# Patient Record
Sex: Male | Born: 1945 | Race: White | Hispanic: No | Marital: Married | Smoking: Former smoker
Health system: Southern US, Community
[De-identification: ages and names within clinical notes are randomized; demographics above are authoritative.]

## PROBLEM LIST (undated history)

## (undated) DIAGNOSIS — S72002A Fracture of unspecified part of neck of left femur, initial encounter for closed fracture: Secondary | ICD-10-CM

## (undated) DIAGNOSIS — S61218A Laceration without foreign body of other finger without damage to nail, initial encounter: Secondary | ICD-10-CM

## (undated) DIAGNOSIS — J349 Unspecified disorder of nose and nasal sinuses: Secondary | ICD-10-CM

## (undated) DIAGNOSIS — F329 Major depressive disorder, single episode, unspecified: Secondary | ICD-10-CM

## (undated) DIAGNOSIS — S62102A Fracture of unspecified carpal bone, left wrist, initial encounter for closed fracture: Secondary | ICD-10-CM

## (undated) DIAGNOSIS — E785 Hyperlipidemia, unspecified: Secondary | ICD-10-CM

## (undated) DIAGNOSIS — J45909 Unspecified asthma, uncomplicated: Secondary | ICD-10-CM

## (undated) DIAGNOSIS — F32A Depression, unspecified: Secondary | ICD-10-CM

## (undated) DIAGNOSIS — C801 Malignant (primary) neoplasm, unspecified: Secondary | ICD-10-CM

## (undated) DIAGNOSIS — R0602 Shortness of breath: Secondary | ICD-10-CM

## (undated) DIAGNOSIS — M1990 Unspecified osteoarthritis, unspecified site: Secondary | ICD-10-CM

## (undated) DIAGNOSIS — E669 Obesity, unspecified: Secondary | ICD-10-CM

## (undated) DIAGNOSIS — J449 Chronic obstructive pulmonary disease, unspecified: Secondary | ICD-10-CM

## (undated) DIAGNOSIS — Z8489 Family history of other specified conditions: Secondary | ICD-10-CM

## (undated) DIAGNOSIS — D721 Eosinophilia, unspecified: Secondary | ICD-10-CM

## (undated) DIAGNOSIS — K219 Gastro-esophageal reflux disease without esophagitis: Secondary | ICD-10-CM

## (undated) DIAGNOSIS — D649 Anemia, unspecified: Secondary | ICD-10-CM

## (undated) DIAGNOSIS — D696 Thrombocytopenia, unspecified: Secondary | ICD-10-CM

## (undated) HISTORY — DX: Unspecified asthma, uncomplicated: J45.909

## (undated) HISTORY — DX: Obesity, unspecified: E66.9

## (undated) HISTORY — DX: Thrombocytopenia, unspecified: D69.6

## (undated) HISTORY — DX: Major depressive disorder, single episode, unspecified: F32.9

## (undated) HISTORY — DX: Depression, unspecified: F32.A

## (undated) HISTORY — DX: Unspecified osteoarthritis, unspecified site: M19.90

## (undated) HISTORY — DX: Eosinophilia: D72.1

## (undated) HISTORY — PX: OTHER SURGICAL HISTORY: SHX169

## (undated) HISTORY — DX: Fracture of unspecified carpal bone, left wrist, initial encounter for closed fracture: S62.102A

## (undated) HISTORY — PX: MULTIPLE TOOTH EXTRACTIONS: SHX2053

## (undated) HISTORY — DX: Chronic obstructive pulmonary disease, unspecified: J44.9

## (undated) HISTORY — DX: Eosinophilia, unspecified: D72.10

## (undated) HISTORY — DX: Laceration without foreign body of other finger without damage to nail, initial encounter: S61.218A

---

## 2004-01-08 ENCOUNTER — Ambulatory Visit: Payer: Self-pay | Admitting: Family Medicine

## 2004-05-05 ENCOUNTER — Emergency Department (HOSPITAL_COMMUNITY): Admission: EM | Admit: 2004-05-05 | Discharge: 2004-05-05 | Payer: Self-pay | Admitting: Emergency Medicine

## 2004-05-07 ENCOUNTER — Emergency Department (HOSPITAL_COMMUNITY): Admission: EM | Admit: 2004-05-07 | Discharge: 2004-05-07 | Payer: Self-pay | Admitting: Emergency Medicine

## 2004-10-20 ENCOUNTER — Ambulatory Visit: Payer: Self-pay | Admitting: Family Medicine

## 2005-01-10 ENCOUNTER — Ambulatory Visit: Payer: Self-pay | Admitting: Family Medicine

## 2005-07-28 ENCOUNTER — Ambulatory Visit: Payer: Self-pay | Admitting: Family Medicine

## 2005-08-31 ENCOUNTER — Ambulatory Visit: Payer: Self-pay | Admitting: Family Medicine

## 2005-10-19 ENCOUNTER — Ambulatory Visit: Payer: Self-pay | Admitting: Family Medicine

## 2005-11-17 ENCOUNTER — Ambulatory Visit: Payer: Self-pay | Admitting: Family Medicine

## 2005-11-21 ENCOUNTER — Ambulatory Visit: Payer: Self-pay | Admitting: Family Medicine

## 2006-03-14 ENCOUNTER — Ambulatory Visit: Payer: Self-pay | Admitting: Family Medicine

## 2008-05-17 ENCOUNTER — Emergency Department (HOSPITAL_COMMUNITY): Admission: EM | Admit: 2008-05-17 | Discharge: 2008-05-17 | Payer: Self-pay | Admitting: Emergency Medicine

## 2009-02-15 ENCOUNTER — Emergency Department (HOSPITAL_COMMUNITY): Admission: EM | Admit: 2009-02-15 | Discharge: 2009-02-15 | Payer: Self-pay | Admitting: Emergency Medicine

## 2009-02-15 ENCOUNTER — Ambulatory Visit: Payer: Self-pay | Admitting: Internal Medicine

## 2009-02-15 DIAGNOSIS — M109 Gout, unspecified: Secondary | ICD-10-CM | POA: Insufficient documentation

## 2009-02-15 DIAGNOSIS — J301 Allergic rhinitis due to pollen: Secondary | ICD-10-CM | POA: Insufficient documentation

## 2009-02-15 DIAGNOSIS — D721 Eosinophilia: Secondary | ICD-10-CM

## 2009-02-15 DIAGNOSIS — M129 Arthropathy, unspecified: Secondary | ICD-10-CM | POA: Insufficient documentation

## 2009-02-15 DIAGNOSIS — K625 Hemorrhage of anus and rectum: Secondary | ICD-10-CM

## 2009-02-16 HISTORY — PX: COLONOSCOPY W/ BIOPSIES AND POLYPECTOMY: SHX1376

## 2009-02-18 ENCOUNTER — Encounter: Payer: Self-pay | Admitting: Gastroenterology

## 2009-02-24 ENCOUNTER — Ambulatory Visit (HOSPITAL_COMMUNITY): Admission: RE | Admit: 2009-02-24 | Discharge: 2009-02-24 | Payer: Self-pay | Admitting: Gastroenterology

## 2009-02-24 ENCOUNTER — Ambulatory Visit: Payer: Self-pay | Admitting: Gastroenterology

## 2009-03-15 ENCOUNTER — Ambulatory Visit (HOSPITAL_COMMUNITY): Payer: Self-pay | Admitting: Oncology

## 2009-03-15 ENCOUNTER — Encounter (HOSPITAL_COMMUNITY): Admission: RE | Admit: 2009-03-15 | Discharge: 2009-04-14 | Payer: Self-pay | Admitting: Oncology

## 2009-04-15 ENCOUNTER — Encounter: Payer: Self-pay | Admitting: Gastroenterology

## 2009-04-19 ENCOUNTER — Encounter (HOSPITAL_COMMUNITY): Admission: RE | Admit: 2009-04-19 | Discharge: 2009-05-19 | Payer: Self-pay | Admitting: Oncology

## 2009-05-17 ENCOUNTER — Ambulatory Visit (HOSPITAL_COMMUNITY): Payer: Self-pay | Admitting: Oncology

## 2009-05-21 ENCOUNTER — Encounter (HOSPITAL_COMMUNITY): Admission: RE | Admit: 2009-05-21 | Discharge: 2009-06-20 | Payer: Self-pay | Admitting: Oncology

## 2009-06-21 ENCOUNTER — Encounter (HOSPITAL_COMMUNITY): Admission: RE | Admit: 2009-06-21 | Discharge: 2009-07-21 | Payer: Self-pay | Admitting: Oncology

## 2009-09-02 ENCOUNTER — Encounter: Payer: Self-pay | Admitting: Gastroenterology

## 2009-09-28 ENCOUNTER — Ambulatory Visit (HOSPITAL_COMMUNITY): Payer: Self-pay | Admitting: Oncology

## 2009-09-28 ENCOUNTER — Encounter (HOSPITAL_COMMUNITY)
Admission: RE | Admit: 2009-09-28 | Discharge: 2009-10-28 | Payer: Self-pay | Source: Home / Self Care | Admitting: Oncology

## 2009-12-27 ENCOUNTER — Encounter (HOSPITAL_COMMUNITY)
Admission: RE | Admit: 2009-12-27 | Discharge: 2010-01-26 | Payer: Self-pay | Source: Home / Self Care | Attending: Oncology | Admitting: Oncology

## 2009-12-27 ENCOUNTER — Ambulatory Visit (HOSPITAL_COMMUNITY): Payer: Self-pay | Admitting: Oncology

## 2010-01-27 ENCOUNTER — Encounter: Payer: Self-pay | Admitting: Internal Medicine

## 2010-02-15 NOTE — Letter (Signed)
Summary: [facility]   Imported By: Diana Eves 04/15/2009 11:36:39  _____________________________________________________________________  External Attachment:    Type:   Image     Comment:   External Document

## 2010-02-15 NOTE — Letter (Signed)
Summary: DR Mariel Sleet REFERRAL  DR Mariel Sleet REFERRAL   Imported By: Ave Filter 02/24/2009 11:11:11  _____________________________________________________________________  External Attachment:    Type:   Image     Comment:   External Document

## 2010-02-15 NOTE — Letter (Signed)
Summary: [facility] ORDER  [facility]   Imported By: Ricard Dillon 02/18/2009 12:46:49  _____________________________________________________________________  External Attachment:    Type:   Image     Comment:   External Document

## 2010-02-15 NOTE — Letter (Signed)
Summary: [facility]   Imported By: Rexene Alberts 09/02/2009 09:10:09  _____________________________________________________________________  External Attachment:    Type:   Image     Comment:   External Document

## 2010-02-15 NOTE — Assessment & Plan Note (Signed)
Summary: NPP.GI BLEED/GLU   Visit Type:  Initial Consult Referring Provider:  Dr Adriana Sim[facility]) Primary Care Provider:  Nyland  Chief Complaint:  GI bleeding.  History of Present Illness: 65 y/o caucasian male w/ bloody diarrhea that began 3 days ago.  c/o loose watery stool 3 days ago w/ bright red bleeding Fri night.  Then became darkerSat am and evening.  BMs x 2 today without blood.  Denies any abd pain or proctalgia.  Hx hemorrhoids.  The patient denies any upper GI symptoms which include nausea, vomiting, heartburn, indigestion, dysphagia, odynophagia, anorexia or early satiety.  c/o bloating & passing flatus.  Denies CP or SOB, no dizziness.   Taking 2 Aleve q 3 days for athritis in hands, also on mobic once daily for gout in feet.     Hgb 14.2, Hct 41.5,   Current Problems (verified): 1)  Allergic Rhinitis, Seasonal  (ICD-477.0) 2)  Arthritis  (ICD-716.90) 3)  Gout, Unspecified  (ICD-274.9) 4)  Rectal Bleeding  (ICD-569.3)  Current Medications (verified): 1)  Lipitor 20 Mg Tabs (Atorvastatin Calcium) .... 1/2 Tablet Daily 2)  Mobic 7.5 Mg Tabs (Meloxicam) .... Take 1 Tablet By Mouth Once A Day 3)  Fish Oil 1000 Mg .... Take 1 Tablet By Mouth Once A Day 4)  Symbicort 80-4.5 Mcg/act Aero (Budesonide-Formoterol Fumarate) .... Two Times A Day As Needed For Sinuses  Allergies (verified): No Known Drug Allergies  Past History:  Past Medical History: ALLERGIC RHINITIS, SEASONAL (ICD-477.0) ARTHRITIS (ICD-716.90) GOUT, UNSPECIFIED (ICD-274.9)  Past Surgical History: Unremarkable  Family History: No known family history of colorectal carcinoma, IBD, liver or chronic GI problems. Father: (deceased 33) MI, COPD Mother: (deceased 22) leukemia Siblings: 5 brothers, 3 sisters total 2 deceased CAD, CVA, leukemia  Social History: married 1964 4 grown healthy chew tobacco x 58yrs Patient is a former smoker. 25pkyr, quit 1985  Alcohol Use - yes, beer couple times per  month Illicit Drug Use - no Daily Caffeine Use Patient does not get regular exercise.  Smoking Status:  quit Drug Use:  no Does Patient Exercise:  no  Review of Systems General:  Denies fever, chills, sweats, anorexia, fatigue, weakness, malaise, weight loss, and sleep disorder. CV:  Denies chest pains, angina, palpitations, syncope, dyspnea on exertion, orthopnea, PND, peripheral edema, and claudication. Resp:  Denies dyspnea at rest, dyspnea with exercise, cough, sputum, wheezing, coughing up blood, and pleurisy. GI:  Denies difficulty swallowing, pain on swallowing, nausea, indigestion/heartburn, vomiting, vomiting blood, jaundice, diarrhea, change in bowel habits, and fecal incontinence. GU:  Denies urinary burning, blood in urine, urinary frequency, urinary hesitancy, and nocturnal urination. Derm:  Denies rash, itching, dry skin, hives, moles, warts, and unhealing ulcers. Psych:  Denies depression, anxiety, memory loss, suicidal ideation, hallucinations, paranoia, phobia, and confusion. Heme:  Denies bruising and enlarged lymph nodes.  Vital Signs:  Patient profile:   65 year old male Height:      67.5 inches Weight:      280 pounds BMI:     43.36 Temp:     97.8 degrees F oral Pulse rate:   60 / minute BP sitting:   150 / 78  (left arm) Cuff size:   large  Vitals Entered By: Cloria Spring LPN (February 15, 2009 3:54 PM)  Physical Exam  General:  Well developed, well nourished, no acute distress.obese.   Head:  Normocephalic and atraumatic. Eyes:  Sclera clear, no icterus. Ears:  Normal auditory acuity. Nose:  No deformity, discharge,  or lesions. Mouth:  No deformity or lesions, dentition normal. Neck:  Supple; no masses or thyromegaly. Lungs:  Clear throughout to auscultation. Heart:  Regular rate and rhythm; no murmurs, rubs,  or bruits. Abdomen:  Soft, nontender and nondistended. No masses, hepatosplenomegaly or hernias noted. Normal bowel sounds.obese, without  guarding, and without rebound.   Rectal:  deferred until time of colonoscopy.   Msk:  Symmetrical with no gross deformities. Normal posture. Pulses:  Normal pulses noted. Extremities:  No clubbing, cyanosis, edema or deformities noted. Neurologic:  Alert and  oriented x4;  grossly normal neurologically. Skin:  Intact without significant lesions or rashes. Cervical Nodes:  No significant cervical adenopathy. Psych:  Alert and cooperative. Normal mood and affect.  Impression & Recommendations:  Problem # 1:  RECTAL BLEEDING (ICD-569.3) 65 y/o caucasian male with painless moderative volume rectal bleeding w/ loose stools.  Seen in ER this AM and hgb normal.   Denies any upper GI complaints.  Differentials include internal hemorrhoids, fissure, NSAID-induced colitis, diverticular bleeding, or colorectal CA.  He also has peripheral eosinophilia and I am not sure if this is an acute reaction or chronic problem, but this will need to be follow-up to r/o occult malignancy.  Diagnostic colonoscopy to be performed by Dr. Jonette Eva in the near future.  I have discussed risks and benefits which include, but are not limited to, bleeding, infection, perforation, or medication reaction.  The patient agrees with this plan and consent will be obtained.  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  All changed / newly prescribed medications were explained.  A complete medication list was provided to the patient / caregiver.      Appended Document: NPP.GI BLEED/GLU Pt should see PCP for eosinophilia.  FAX CBC to Dr. Lysbeth Galas.  Appended Document: NPP.GI BLEED/GLU LM for return call.  Appended Document: NPP.GI BLEED/GLU Pt has appt w/ Dr Lysbeth Galas in 2 weeks.  Will FU EOS.  Appended Document: NPP.GI BLEED/GLU Labs  02/22/2009 from Dr Lysbeth Galas EOS 18%, WBC 7.2, Plt 148, CBC & CMP otherwise normal.  Appended Document: NPP.GI BLEED/GLU Disc above w/ Dr Darrick Penna. Will refer to hematology/oncology.

## 2010-02-17 NOTE — Letter (Signed)
Summary: [facility]  [facility]   Imported By: Rexene Alberts 01/27/2010 09:32:10  _____________________________________________________________________  External Attachment:    Type:   Image     Comment:   External Document

## 2010-03-25 ENCOUNTER — Encounter (HOSPITAL_COMMUNITY): Payer: BC Managed Care – PPO | Attending: Oncology

## 2010-03-25 ENCOUNTER — Other Ambulatory Visit (HOSPITAL_COMMUNITY): Payer: BC Managed Care – PPO

## 2010-03-25 DIAGNOSIS — D696 Thrombocytopenia, unspecified: Secondary | ICD-10-CM

## 2010-03-25 DIAGNOSIS — D721 Eosinophilia, unspecified: Secondary | ICD-10-CM | POA: Insufficient documentation

## 2010-03-29 LAB — CBC
HCT: 40.1 % (ref 39.0–52.0)
Hemoglobin: 13.9 g/dL (ref 13.0–17.0)
MCH: 31.4 pg (ref 26.0–34.0)
MCHC: 34.7 g/dL (ref 30.0–36.0)
MCV: 90.5 fL (ref 78.0–100.0)
Platelets: 126 10*3/uL — ABNORMAL LOW (ref 150–400)
RBC: 4.43 MIL/uL (ref 4.22–5.81)
RDW: 12.5 % (ref 11.5–15.5)
WBC: 6.9 10*3/uL (ref 4.0–10.5)

## 2010-03-29 LAB — DIFFERENTIAL
Basophils Absolute: 0.1 10*3/uL (ref 0.0–0.1)
Basophils Relative: 1 % (ref 0–1)
Eosinophils Absolute: 1.3 10*3/uL — ABNORMAL HIGH (ref 0.0–0.7)
Eosinophils Relative: 19 % — ABNORMAL HIGH (ref 0–5)
Lymphocytes Relative: 22 % (ref 12–46)
Lymphs Abs: 1.5 10*3/uL (ref 0.7–4.0)
Monocytes Absolute: 0.4 10*3/uL (ref 0.1–1.0)
Monocytes Relative: 6 % (ref 3–12)
Neutro Abs: 3.7 10*3/uL (ref 1.7–7.7)
Neutrophils Relative %: 53 % (ref 43–77)

## 2010-03-29 LAB — COMPREHENSIVE METABOLIC PANEL
AST: 17 U/L (ref 0–37)
Alkaline Phosphatase: 77 U/L (ref 39–117)
CO2: 26 mEq/L (ref 19–32)
Chloride: 103 mEq/L (ref 96–112)
GFR calc Af Amer: >60 mL/min (ref >60)
Glucose, Bld: 109 mg/dL — ABNORMAL HIGH (ref 70–99)
Potassium: 4.1 mEq/L (ref 3.5–5.1)
Total Bilirubin: 1.2 mg/dL (ref 0.3–1.2)

## 2010-03-31 LAB — DIFFERENTIAL
Eosinophils Relative: 14 % — ABNORMAL HIGH (ref 0–5)
Lymphocytes Relative: 20 % (ref 12–46)
Lymphs Abs: 1.4 10*3/uL (ref 0.7–4.0)
Neutro Abs: 4.2 10*3/uL (ref 1.7–7.7)

## 2010-03-31 LAB — CBC
MCH: 31.6 pg (ref 26.0–34.0)
MCHC: 34.2 g/dL (ref 30.0–36.0)
MCV: 92.4 fL (ref 78.0–100.0)
Platelets: 116 10*3/uL — ABNORMAL LOW (ref 150–400)
RBC: 4.26 MIL/uL (ref 4.22–5.81)
RDW: 12.9 % (ref 11.5–15.5)
WBC: 6.9 10*3/uL (ref 4.0–10.5)

## 2010-04-03 LAB — COMPREHENSIVE METABOLIC PANEL
ALT: 22 U/L (ref 0–53)
AST: 21 U/L (ref 0–37)
Albumin: 4 g/dL (ref 3.5–5.2)
Alkaline Phosphatase: 76 U/L (ref 39–117)
BUN: 17 mg/dL (ref 6–23)
CO2: 28 mEq/L (ref 19–32)
Calcium: 9.1 mg/dL (ref 8.4–10.5)
Chloride: 104 mEq/L (ref 96–112)
Creatinine, Ser: 1.16 mg/dL (ref 0.4–1.5)
GFR calc Af Amer: >60 mL/min (ref >60)
GFR calc non Af Amer: >60 mL/min (ref >60)
Glucose, Bld: 110 mg/dL — ABNORMAL HIGH (ref 70–99)
Potassium: 4.4 mEq/L (ref 3.5–5.1)
Sodium: 139 mEq/L (ref 135–145)
Total Bilirubin: 1 mg/dL (ref 0.3–1.2)
Total Protein: 6.9 g/dL (ref 6.0–8.3)

## 2010-04-03 LAB — CBC
HCT: 41.5 % (ref 39.0–52.0)
Hemoglobin: 14.2 g/dL (ref 13.0–17.0)
MCHC: 34.2 g/dL (ref 30.0–36.0)
MCV: 91.9 fL (ref 78.0–100.0)
Platelets: 137 10*3/uL — ABNORMAL LOW (ref 150–400)
RBC: 4.51 MIL/uL (ref 4.22–5.81)
RDW: 12.5 % (ref 11.5–15.5)
WBC: 8.2 10*3/uL (ref 4.0–10.5)

## 2010-04-03 LAB — DIFFERENTIAL
Basophils Relative: 0 % (ref 0–1)
Eosinophils Absolute: 1.6 10*3/uL — ABNORMAL HIGH (ref 0.0–0.7)
Eosinophils Relative: 20 % — ABNORMAL HIGH (ref 0–5)
Lymphs Abs: 1.5 10*3/uL (ref 0.7–4.0)
Neutro Abs: 4.8 10*3/uL (ref 1.7–7.7)

## 2010-04-04 LAB — DIFFERENTIAL
Eosinophils Relative: 19 % — ABNORMAL HIGH (ref 0–5)
Lymphocytes Relative: 20 % (ref 12–46)
Lymphs Abs: 1.4 10*3/uL (ref 0.7–4.0)
Monocytes Absolute: 0.3 10*3/uL (ref 0.1–1.0)
Neutro Abs: 3.9 10*3/uL (ref 1.7–7.7)
Neutrophils Relative %: 56 % (ref 43–77)

## 2010-04-04 LAB — CBC
HCT: 41.4 % (ref 39.0–52.0)
Hemoglobin: 14.2 g/dL (ref 13.0–17.0)
Platelets: 124 10*3/uL — ABNORMAL LOW (ref 150–400)
RDW: 12.3 % (ref 11.5–15.5)

## 2010-04-05 LAB — GIARDIA/CRYPTOSPORIDIUM SCREEN(EIA)
Cryptosporidium Screen (EIA): NEGATIVE
Giardia Screen - EIA: NEGATIVE

## 2010-04-05 LAB — CBC
Hemoglobin: 14.9 g/dL (ref 13.0–17.0)
Platelets: 121 10*3/uL — ABNORMAL LOW (ref 150–400)
RDW: 12.4 % (ref 11.5–15.5)

## 2010-04-05 LAB — DIFFERENTIAL
Basophils Absolute: 0 10*3/uL (ref 0.0–0.1)
Eosinophils Relative: 19 % — ABNORMAL HIGH (ref 0–5)
Monocytes Absolute: 0.3 10*3/uL (ref 0.1–1.0)
Monocytes Relative: 4 % (ref 3–12)
Neutrophils Relative %: 59 % (ref 43–77)

## 2010-04-05 LAB — ANA: Anti Nuclear Antibody(ANA): NEGATIVE

## 2010-04-06 LAB — DIFFERENTIAL
Basophils Relative: 0 % (ref 0–1)
Eosinophils Relative: 14 % — ABNORMAL HIGH (ref 0–5)
Eosinophils Relative: 14 % — ABNORMAL HIGH (ref 0–5)
Lymphocytes Relative: 21 % (ref 12–46)
Lymphocytes Relative: 18 % (ref 12–46)
Lymphs Abs: 1.7 10*3/uL (ref 0.7–4.0)
Monocytes Absolute: 0.3 10*3/uL (ref 0.1–1.0)
Monocytes Relative: 4 % (ref 3–12)
Neutro Abs: 4.9 10*3/uL (ref 1.7–7.7)
Neutro Abs: 4.4 10*3/uL (ref 1.7–7.7)
Neutrophils Relative %: 63 % (ref 43–77)

## 2010-04-06 LAB — CBC
HCT: 40.9 % (ref 39.0–52.0)
MCV: 91.1 fL (ref 78.0–100.0)
MCV: 93.3 fL (ref 78.0–100.0)
Platelets: 146 10*3/uL — ABNORMAL LOW (ref 150–400)
Platelets: 133 10*3/uL — ABNORMAL LOW (ref 150–400)
RBC: 4.64 MIL/uL (ref 4.22–5.81)
RDW: 12.9 % (ref 11.5–15.5)
RDW: 12.7 % (ref 11.5–15.5)

## 2010-04-06 LAB — COMPREHENSIVE METABOLIC PANEL
AST: 22 U/L (ref 0–37)
Albumin: 4 g/dL (ref 3.5–5.2)
GFR calc Af Amer: >60 mL/min (ref >60)
GFR calc non Af Amer: >60 mL/min (ref >60)
Total Bilirubin: 1.1 mg/dL (ref 0.3–1.2)
Total Protein: 6.9 g/dL (ref 6.0–8.3)

## 2010-04-11 LAB — RETICULOCYTES: RBC.: 4.26 MIL/uL (ref 4.22–5.81)

## 2010-04-11 LAB — CBC
MCHC: 35 g/dL (ref 30.0–36.0)
MCV: 92.6 fL (ref 78.0–100.0)
Platelets: 138 10*3/uL — ABNORMAL LOW (ref 150–400)
RDW: 12.7 % (ref 11.5–15.5)

## 2010-04-11 LAB — DIFFERENTIAL
Eosinophils Relative: 18 % — ABNORMAL HIGH (ref 0–5)
Lymphocytes Relative: 24 % (ref 12–46)
Lymphs Abs: 1.9 10*3/uL (ref 0.7–4.0)
Monocytes Absolute: 0.6 10*3/uL (ref 0.1–1.0)
Neutro Abs: 4 10*3/uL (ref 1.7–7.7)

## 2010-04-11 LAB — IRON AND TIBC
Iron: 87 ug/dL (ref 42–135)
Saturation Ratios: 27 % (ref 20–55)
UIBC: 231 ug/dL

## 2010-04-11 LAB — COMPREHENSIVE METABOLIC PANEL
AST: 26 U/L (ref 0–37)
Albumin: 4 g/dL (ref 3.5–5.2)
Calcium: 9 mg/dL (ref 8.4–10.5)
Creatinine, Ser: 1.29 mg/dL (ref 0.4–1.5)
GFR calc Af Amer: >60 mL/min (ref >60)

## 2010-04-11 LAB — SEDIMENTATION RATE: Sed Rate: 3 mm/hr (ref 0–16)

## 2010-04-11 LAB — VITAMIN B12: Vitamin B-12: 356 pg/mL (ref 211–911)

## 2010-04-11 LAB — TSH: TSH: 2.966 u[IU]/mL (ref 0.350–4.500)

## 2010-05-18 ENCOUNTER — Other Ambulatory Visit (HOSPITAL_COMMUNITY): Payer: Self-pay | Admitting: Oncology

## 2010-05-18 DIAGNOSIS — G44009 Cluster headache syndrome, unspecified, not intractable: Secondary | ICD-10-CM

## 2010-05-20 ENCOUNTER — Other Ambulatory Visit (HOSPITAL_COMMUNITY): Payer: Self-pay | Admitting: Oncology

## 2010-05-20 ENCOUNTER — Ambulatory Visit (HOSPITAL_COMMUNITY)
Admission: RE | Admit: 2010-05-20 | Discharge: 2010-05-20 | Disposition: A | Discharge status: Home / Self Care | Payer: BC Managed Care – PPO | Source: Ambulatory Visit | Attending: Oncology | Admitting: Oncology

## 2010-05-20 ENCOUNTER — Encounter (HOSPITAL_COMMUNITY): Payer: BC Managed Care – PPO | Attending: Oncology

## 2010-05-20 DIAGNOSIS — G44009 Cluster headache syndrome, unspecified, not intractable: Secondary | ICD-10-CM

## 2010-05-20 DIAGNOSIS — D696 Thrombocytopenia, unspecified: Secondary | ICD-10-CM

## 2010-05-20 DIAGNOSIS — D721 Eosinophilia, unspecified: Secondary | ICD-10-CM | POA: Insufficient documentation

## 2010-05-20 LAB — COMPREHENSIVE METABOLIC PANEL
ALT: 16 U/L (ref 0–53)
AST: 18 U/L (ref 0–37)
Alkaline Phosphatase: 92 U/L (ref 39–117)
CO2: 28 mEq/L (ref 19–32)
Chloride: 104 mEq/L (ref 96–112)
GFR calc Af Amer: >60 mL/min (ref >60)
GFR calc non Af Amer: >60 mL/min (ref >60)
Glucose, Bld: 110 mg/dL — ABNORMAL HIGH (ref 70–99)
Potassium: 3.8 mEq/L (ref 3.5–5.1)
Sodium: 142 mEq/L (ref 135–145)
Total Bilirubin: 0.9 mg/dL (ref 0.3–1.2)

## 2010-05-20 LAB — CBC
Hemoglobin: 13.8 g/dL (ref 13.0–17.0)
MCH: 30.8 pg (ref 26.0–34.0)
Platelets: 132 10*3/uL — ABNORMAL LOW (ref 150–400)
RBC: 4.48 MIL/uL (ref 4.22–5.81)
WBC: 7.3 10*3/uL (ref 4.0–10.5)

## 2010-05-20 LAB — DIFFERENTIAL
Eosinophils Absolute: 1.2 10*3/uL — ABNORMAL HIGH (ref 0.0–0.7)
Lymphocytes Relative: 20 % (ref 12–46)
Lymphs Abs: 1.5 10*3/uL (ref 0.7–4.0)
Monocytes Relative: 5 % (ref 3–12)
Neutro Abs: 4.2 10*3/uL (ref 1.7–7.7)
Neutrophils Relative %: 57 % (ref 43–77)

## 2010-05-25 ENCOUNTER — Other Ambulatory Visit (HOSPITAL_COMMUNITY): Payer: Self-pay | Admitting: Oncology

## 2010-05-25 DIAGNOSIS — R51 Headache: Secondary | ICD-10-CM

## 2010-06-03 ENCOUNTER — Ambulatory Visit (HOSPITAL_COMMUNITY)
Admission: RE | Admit: 2010-06-03 | Discharge: 2010-06-03 | Disposition: A | Discharge status: Home / Self Care | Payer: BC Managed Care – PPO | Source: Ambulatory Visit | Attending: Oncology | Admitting: Oncology

## 2010-06-03 ENCOUNTER — Other Ambulatory Visit (HOSPITAL_COMMUNITY): Payer: Self-pay | Admitting: Oncology

## 2010-06-03 DIAGNOSIS — R51 Headache: Secondary | ICD-10-CM

## 2010-06-03 MED ORDER — GADOBENATE DIMEGLUMINE 529 MG/ML IV SOLN: 20.0000 mL | Freq: Once | INTRAVENOUS | Status: DC | PRN

## 2010-06-24 ENCOUNTER — Other Ambulatory Visit (HOSPITAL_COMMUNITY): Payer: Self-pay | Admitting: Oncology

## 2010-06-24 ENCOUNTER — Encounter (HOSPITAL_COMMUNITY): Payer: BC Managed Care – PPO | Attending: Oncology

## 2010-06-24 DIAGNOSIS — D696 Thrombocytopenia, unspecified: Secondary | ICD-10-CM

## 2010-06-24 DIAGNOSIS — D721 Eosinophilia, unspecified: Secondary | ICD-10-CM | POA: Insufficient documentation

## 2010-06-24 LAB — CBC
HCT: 42.5 % (ref 39.0–52.0)
MCHC: 33.4 g/dL (ref 30.0–36.0)
Platelets: 126 10*3/uL — ABNORMAL LOW (ref 150–400)
RDW: 12.7 % (ref 11.5–15.5)
WBC: 6.4 10*3/uL (ref 4.0–10.5)

## 2010-06-24 LAB — DIFFERENTIAL
Basophils Absolute: 0.1 10*3/uL (ref 0.0–0.1)
Basophils Relative: 1 % (ref 0–1)
Lymphocytes Relative: 22 % (ref 12–46)
Monocytes Absolute: 0.4 10*3/uL (ref 0.1–1.0)
Neutro Abs: 3.5 10*3/uL (ref 1.7–7.7)

## 2010-07-01 ENCOUNTER — Ambulatory Visit (HOSPITAL_COMMUNITY): Payer: Self-pay | Admitting: Oncology

## 2010-09-12 ENCOUNTER — Telehealth (HOSPITAL_COMMUNITY): Payer: Self-pay | Admitting: *Deleted

## 2010-09-12 ENCOUNTER — Encounter (HOSPITAL_COMMUNITY): Payer: BC Managed Care – PPO | Attending: Oncology | Admitting: Oncology

## 2010-09-12 ENCOUNTER — Encounter (HOSPITAL_COMMUNITY): Payer: Self-pay | Admitting: Oncology

## 2010-09-12 VITALS — BP 174/78 | HR 60 | Temp 97.8°F | Ht 67.5 in | Wt 272.4 lb

## 2010-09-12 DIAGNOSIS — D721 Eosinophilia, unspecified: Secondary | ICD-10-CM | POA: Insufficient documentation

## 2010-09-12 LAB — COMPREHENSIVE METABOLIC PANEL
Albumin: 3.7 g/dL (ref 3.5–5.2)
BUN: 14 mg/dL (ref 6–23)
Creatinine, Ser: 0.93 mg/dL (ref 0.50–1.35)
Total Bilirubin: 0.4 mg/dL (ref 0.3–1.2)
Total Protein: 6.5 g/dL (ref 6.0–8.3)

## 2010-09-12 LAB — CBC
HCT: 40.8 % (ref 39.0–52.0)
Hemoglobin: 13.9 g/dL (ref 13.0–17.0)
MCH: 31.6 pg (ref 26.0–34.0)
MCHC: 34.1 g/dL (ref 30.0–36.0)

## 2010-09-12 LAB — DIFFERENTIAL
Basophils Relative: 1 % (ref 0–1)
Eosinophils Absolute: 1.6 10*3/uL — ABNORMAL HIGH (ref 0.0–0.7)
Monocytes Absolute: 0.4 10*3/uL (ref 0.1–1.0)
Monocytes Relative: 6 % (ref 3–12)

## 2010-09-12 NOTE — Progress Notes (Signed)
This office note has been dictated.

## 2010-09-12 NOTE — Progress Notes (Signed)
CC:   Delaney Meigs, M.D.  DIAGNOSES: 1. 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 2. Obesity. 3. Elevation of his blood pressure today systolically to 176/82.  That     is on 2 occasions, so I have asked him to see Dr. Lysbeth Galas in the     next few days to week and have that repeated. 4. Hypercholesterolemia. 5. Degenerative joint disease. 6. Depression on citalopram with nice improvement. 7. History of gout.  His weight is 272 pounds.  That compares to 279 when he first came here, so he has not really lost much weight.  He had lost down to 260 and then regained almost every bit of it.  His blood pressure when he first came in was 174/78.  I got 176/82 in the left arm sitting position.  The former blood pressure was in the right arm sitting position.  He has not noticed anything new other than a little rash on his right hand and he does have a little area of skin where it is cracked open. It is clean, however, does not look infected.  He has got some liquid banded over it.  He states that it itches in the same exact place on the opposite hand also itches.  He gives a history of having a large wood splinter in the right hand operated on 5 years ago by a Hydrographic surveyor in [place] that he cannot recall the name however.  He is not aware of anything new or different.  No night sweats.  No fevers.  No chills.  We have checked a number of things looking for eosinophilia, have not found a source.  I do not think we should necessarily do anything more unless his counts have changed.  He also had headaches which made [place] do an MRI in May of this year.  It was negative.  He did not see a neurologist.  He changed pillows and his headaches got better, but his wife was very, very concerned about these headache developments.  We did CT abdomen without contrast.  He did not have any adenopathy.  No splenomegaly.  No hepatomegaly.  He has been stable as  I mentioned and will see what his counts are today.  If they are stable, I do not think we should do anything further other than watch him.  I do not think I can justify a bone marrow aspirate and biopsy.  His exam remains benign.  There is no adenopathy. No splenomegaly.  No hepatomegaly.  Lungs are clear.  Heart shows regular rhythm and rate without murmur, rub or gallop.  He has no peripheral edema.  So we will see what his counts are.  He will see Dr. Lysbeth Galas for a blood pressure check.  We will see him in 8 months.    ______________________________ Ladona Horns. Mariel Sleet, MD ESN/MEDQ  D:  09/12/2010  T:  09/12/2010  Job:  [id number]

## 2010-09-12 NOTE — Telephone Encounter (Signed)
Spoke with Tyler Aas pt's wife, message given as below.

## 2010-09-12 NOTE — Patient Instructions (Signed)
[facility]  Discharge Instructions  RECOMMENDATIONS MADE BY THE CONSULTANT AND ANY TEST RESULTS WILL BE SENT TO YOUR REFERRING DOCTOR.        SPECIAL INSTRUCTIONS/FOLLOW-UP:  See Dr.Nyland again this week to have your blood pressure rechecked. Cut down on your sodium intake. Try to lose some weight. Se [place] back in 8 months, lab test prior to seeing MD.   I acknowledge that I have been informed and understand all the instructions given to me and received a copy. I do not have any more questions at this time, but understand that I may call the [facility]ty [facility] at [facility] at [phone] during business hours should I have any further questions or need assistance in obtaining follow-up care.    __________________________________________  _____________  __________ Signature of Patient or Authorized Representative            Date                   Time    __________________________________________ Nurse's Signature

## 2010-09-12 NOTE — Telephone Encounter (Signed)
Message copied by Dennie Maizes on Mon Sep 12, 2010 12:45 PM ------      Message from: Mariel Sleet, ERIC S      Created: Mon Sep 12, 2010 10:33 AM       Call him and tell him his eos are not really different and we'll see him in 8 mo.

## 2010-10-27 ENCOUNTER — Emergency Department (HOSPITAL_COMMUNITY): Payer: BC Managed Care – PPO

## 2010-10-27 ENCOUNTER — Emergency Department (HOSPITAL_COMMUNITY)
Admission: EM | Admit: 2010-10-27 | Discharge: 2010-10-27 | Disposition: A | Discharge status: Home / Self Care | Payer: BC Managed Care – PPO | Attending: Emergency Medicine | Admitting: Emergency Medicine

## 2010-10-27 ENCOUNTER — Encounter (HOSPITAL_COMMUNITY): Payer: Self-pay | Admitting: Oncology

## 2010-10-27 DIAGNOSIS — J449 Chronic obstructive pulmonary disease, unspecified: Secondary | ICD-10-CM | POA: Insufficient documentation

## 2010-10-27 DIAGNOSIS — Y92009 Unspecified place in unspecified non-institutional (private) residence as the place of occurrence of the external cause: Secondary | ICD-10-CM | POA: Insufficient documentation

## 2010-10-27 DIAGNOSIS — Z79899 Other long term (current) drug therapy: Secondary | ICD-10-CM | POA: Insufficient documentation

## 2010-10-27 DIAGNOSIS — W298XXA Contact with other powered powered hand tools and household machinery, initial encounter: Secondary | ICD-10-CM | POA: Insufficient documentation

## 2010-10-27 DIAGNOSIS — S62609B Fracture of unspecified phalanx of unspecified finger, initial encounter for open fracture: Secondary | ICD-10-CM | POA: Insufficient documentation

## 2010-10-27 DIAGNOSIS — S61209A Unspecified open wound of unspecified finger without damage to nail, initial encounter: Secondary | ICD-10-CM | POA: Insufficient documentation

## 2010-10-27 DIAGNOSIS — J4489 Other specified chronic obstructive pulmonary disease: Secondary | ICD-10-CM | POA: Insufficient documentation

## 2010-10-27 MED ORDER — LIDOCAINE HCL (PF) 2 % IJ SOLN
INTRAMUSCULAR | Status: AC
  Administered 2010-10-27: 17:00:00
  Filled 2010-10-27: qty 10

## 2010-10-27 MED ORDER — OXYCODONE-ACETAMINOPHEN 5-325 MG PO TABS: 1.0000 | ORAL_TABLET | ORAL | Status: AC | PRN

## 2010-10-27 MED ORDER — LIDOCAINE-EPINEPHRINE 2 %-1:100000 IJ SOLN: 1.7000 mL | Freq: Once | INTRAMUSCULAR | Status: DC

## 2010-10-27 MED ORDER — CEFAZOLIN SODIUM 1-5 GM-% IV SOLN
1.0000 g | Freq: Three times a day (TID) | INTRAVENOUS | Status: DC
  Administered 2010-10-27: 1 g via INTRAVENOUS
  Filled 2010-10-27: qty 50

## 2010-10-27 NOTE — ED Notes (Signed)
Pt states he was wood working approx 30 minutes pta when he lacerated the tip of his left index finger - pt bandaged his finger prior to coming to the ER and bleeding was controlled.  Pt reports his last tetanus was approx 4 years ago.

## 2010-10-27 NOTE — ED Notes (Signed)
EDP cleaned and dressed pt's finger wound.  Pt tolerated well.

## 2010-10-27 NOTE — ED Notes (Signed)
Pt cut end of lt index finger with skill saw while on the job site. 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 Finger tip has jagged and raw edges. No suture  laceration noted, Partcial fingernail cut also with saw.  Family at pt's bedside.

## 2010-10-27 NOTE — ED Provider Notes (Signed)
Medical screening examination/treatment/procedure(s) were performed by non-physician practitioner and as supervising physician I was immediately available for consultation/collaboration.  Ellarie Picking P Shaniya Tashiro, MD 10/27/10 [id number] 

## 2010-10-27 NOTE — ED Provider Notes (Signed)
History     CSN: [id number] Arrival date & time: 10/27/2010  4:04 PM  Chief Complaint  Patient presents with  . Finger Injury  . Laceration    (Consider location/radiation/quality/duration/timing/severity/associated sxs/prior treatment) Patient is a 65 y.o. male presenting with skin laceration. The history is provided by the patient.  Laceration  The incident occurred less than 1 hour ago. Pain location: left index finger. Size: avulsion injury to tip of left finger including distal nail plate. Injury mechanism: He was cut by his table saw. The pain is at a severity of 5/10. The pain is moderate. The pain has been constant since onset. He reports no foreign bodies present. His tetanus status is UTD.    Past Medical History  Diagnosis Date  . Eosinophilia   . Thrombocytopenia   . COPD (chronic obstructive pulmonary disease)   . Obesity   . Degenerative joint disease   . Depression   . Gout     History reviewed. No pertinent past surgical history.  Family History  Problem Relation Age of Onset  . Cancer Mother   . Emphysema Father   . Cancer Sister   . Cancer Brother     History  Substance Use Topics  . Smoking status: Former Games developer  . Smokeless tobacco: Current User  . Alcohol Use: Yes      Review of Systems  Constitutional: Negative for fever.  HENT: Negative for congestion, sore throat and neck pain.   Eyes: Negative.   Respiratory: Negative for chest tightness and shortness of breath.   Cardiovascular: Negative for chest pain.  Gastrointestinal: Negative for nausea and abdominal pain.  Genitourinary: Negative.   Musculoskeletal: Negative for joint swelling and arthralgias.  Skin: Positive for wound. Negative for rash.  Neurological: Negative for dizziness, weakness, light-headedness, numbness and headaches.  Hematological: Negative.   Psychiatric/Behavioral: Negative.     Allergies  Review of patient's allergies indicates no known allergies.  Home  Medications   Current Outpatient Rx  Name Route Sig Dispense Refill  . ATORVASTATIN CALCIUM 10 MG PO TABS Oral Take 10 mg by mouth at bedtime.     . BUDESONIDE-FORMOTEROL FUMARATE 80-4.5 MCG/ACT IN AERO Inhalation Inhale 2 puffs into the lungs 2 (two) times daily.      Marland Kitchen CITALOPRAM HYDROBROMIDE 10 MG PO TABS Oral Take 10 mg by mouth at bedtime.     Marland Kitchen DIPHENHYDRAMINE HCL 25 MG PO CAPS Oral Take 50 mg by mouth once as needed. FOR BEE VENOM     . OMEGA-3 FATTY ACIDS 1000 MG PO CAPS Oral Take 1,000 mg by mouth at bedtime.       BP 153/77  Pulse 85  Temp(Src) 98.4 F (36.9 C) (Oral)  Resp 18  Ht 5' 7.5" (1.715 m)  Wt 262 lb (118.842 kg)  BMI 40.43 kg/m2  SpO2 97%  Physical Exam  Nursing note and vitals reviewed. Constitutional: He is oriented to person, place, and time. He appears well-developed and well-nourished.  HENT:  Head: Normocephalic and atraumatic.  Eyes: Conjunctivae are normal.  Neck: Normal range of motion.  Cardiovascular: Normal rate, regular rhythm, normal heart sounds and intact distal pulses.   Pulmonary/Chest: Effort normal and breath sounds normal. He has no wheezes.  Abdominal: Soft. Bowel sounds are normal. There is no tenderness.  Musculoskeletal: Normal range of motion.  Neurological: He is alert and oriented to person, place, and time.  Skin: Skin is warm and dry.       Avulsion injury to distal  left index finger involving distal nail plate.  Wound is hemostatic.  Psychiatric: He has a normal mood and affect.    ED Course  Wound repair Date/Time: 10/27/2010 7:31 PM Performed by: Abisola Carrero L Authorized by: Candis Musa Consent: Verbal consent obtained. Consent given by: patient Preparation: Patient was prepped and draped in the usual sterile fashion. Local anesthesia used: yes Anesthesia: digital block Local anesthetic: lidocaine 2% without epinephrine Anesthetic total: 2 ml Patient tolerance: Patient tolerated the procedure well with no  immediate complications. Comments: Xeroform dressing,  2x2's,  Cling for full padded dressing.   (including critical care time)  Labs Reviewed - No data to display No results found.   No diagnosis found.    MDM  Spoke with Dr.  Romeo Apple who defers to hand specialist.  Call placed to Dr Lajoyce Corners - will see in am in his office.  Requests ancef 1 gram IV,  Xeroform,  Dressing.    Percocet also prescribed for pain relief if needed.  Results for orders placed in visit on 09/12/10  CBC      Component Value Range   WBC 7.3  4.0 - 10.5 (K/uL)   RBC 4.40  4.22 - 5.81 (MIL/uL)   Hemoglobin 13.9  13.0 - 17.0 (g/dL)   HCT 29.5  62.1 - 30.8 (%)   MCV 92.7  78.0 - 100.0 (fL)   MCH 31.6  26.0 - 34.0 (pg)   MCHC 34.1  30.0 - 36.0 (g/dL)   RDW 65.7  84.6 - 96.2 (%)   Platelets 133 (*) 150 - 400 (K/uL)  DIFFERENTIAL      Component Value Range   Neutrophils Relative 54  43 - 77 (%)   Neutro Abs 3.9  1.7 - 7.7 (K/uL)   Lymphocytes Relative 17  12 - 46 (%)   Lymphs Abs 1.3  0.7 - 4.0 (K/uL)   Monocytes Relative 6  3 - 12 (%)   Monocytes Absolute 0.4  0.1 - 1.0 (K/uL)   Eosinophils Relative 22 (*) 0 - 5 (%)   Eosinophils Absolute 1.6 (*) 0.0 - 0.7 (K/uL)   Basophils Relative 1  0 - 1 (%)   Basophils Absolute 0.0  0.0 - 0.1 (K/uL)  COMPREHENSIVE METABOLIC PANEL      Component Value Range   Sodium 139  135 - 145 (mEq/L)   Potassium 4.1  3.5 - 5.1 (mEq/L)   Chloride 103  96 - 112 (mEq/L)   CO2 27  19 - 32 (mEq/L)   Glucose, Bld 119 (*) 70 - 99 (mg/dL)   BUN 14  6 - 23 (mg/dL)   Creatinine, Ser 9.52  0.50 - 1.35 (mg/dL)   Calcium 9.0  8.4 - 84.1 (mg/dL)   Total Protein 6.5  6.0 - 8.3 (g/dL)   Albumin 3.7  3.5 - 5.2 (g/dL)   AST 15  0 - 37 (U/L)   ALT 13  0 - 53 (U/L)   Alkaline Phosphatase 110  39 - 117 (U/L)   Total Bilirubin 0.4  0.3 - 1.2 (mg/dL)   GFR calc non Af Amer >60  >60 (mL/min)   GFR calc Af Amer >60  >60 (mL/min)   Dg Finger Index Left  10/27/2010  *RADIOLOGY REPORT*   Clinical Data: saw injury  LEFT INDEX FINGER 2+V  Comparison: None.  Findings: Transverse amputation across the tuft of the distal phalanx.  There are a few   small adjacent  osseous fragments and overlying soft tissue defect.  No fracture extension to the articular surface.  There is no radiodense foreign body.  IMPRESSION:  1. 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  Original Report Authenticated By: Osa Craver, M.D.     Patients labs and/or radiological studies were reviewed during the medical decision making and disposition process.         Candis Musa, PA 10/27/10 1932

## 2011-01-17 DIAGNOSIS — S72002A Fracture of unspecified part of neck of left femur, initial encounter for closed fracture: Secondary | ICD-10-CM

## 2011-01-17 HISTORY — DX: Fracture of unspecified part of neck of left femur, initial encounter for closed fracture: S72.002A

## 2011-01-20 ENCOUNTER — Other Ambulatory Visit (HOSPITAL_COMMUNITY): Payer: Self-pay | Admitting: Oncology

## 2011-01-31 ENCOUNTER — Emergency Department (HOSPITAL_COMMUNITY): Payer: BC Managed Care – PPO

## 2011-01-31 ENCOUNTER — Other Ambulatory Visit: Payer: Self-pay

## 2011-01-31 ENCOUNTER — Inpatient Hospital Stay (HOSPITAL_COMMUNITY)
Admission: EM | Admit: 2011-01-31 | Discharge: 2011-02-07 | DRG: 210 | Disposition: A | Discharge status: Rehabilitation Facility | Payer: BC Managed Care – PPO | Attending: Internal Medicine | Admitting: Internal Medicine

## 2011-01-31 ENCOUNTER — Encounter (HOSPITAL_COMMUNITY): Payer: Self-pay

## 2011-01-31 DIAGNOSIS — F3289 Other specified depressive episodes: Secondary | ICD-10-CM | POA: Diagnosis present

## 2011-01-31 DIAGNOSIS — W19XXXA Unspecified fall, initial encounter: Secondary | ICD-10-CM

## 2011-01-31 DIAGNOSIS — J449 Chronic obstructive pulmonary disease, unspecified: Secondary | ICD-10-CM | POA: Diagnosis present

## 2011-01-31 DIAGNOSIS — S62113A Displaced fracture of triquetrum [cuneiform] bone, unspecified wrist, initial encounter for closed fracture: Secondary | ICD-10-CM | POA: Diagnosis present

## 2011-01-31 DIAGNOSIS — R001 Bradycardia, unspecified: Secondary | ICD-10-CM | POA: Diagnosis not present

## 2011-01-31 DIAGNOSIS — Z880 Allergy status to penicillin: Secondary | ICD-10-CM

## 2011-01-31 DIAGNOSIS — E871 Hypo-osmolality and hyponatremia: Secondary | ICD-10-CM | POA: Diagnosis not present

## 2011-01-31 DIAGNOSIS — D696 Thrombocytopenia, unspecified: Secondary | ICD-10-CM | POA: Diagnosis present

## 2011-01-31 DIAGNOSIS — W11XXXA Fall on and from ladder, initial encounter: Secondary | ICD-10-CM | POA: Diagnosis present

## 2011-01-31 DIAGNOSIS — D500 Iron deficiency anemia secondary to blood loss (chronic): Secondary | ICD-10-CM | POA: Diagnosis not present

## 2011-01-31 DIAGNOSIS — S72143A Displaced intertrochanteric fracture of unspecified femur, initial encounter for closed fracture: Principal | ICD-10-CM | POA: Diagnosis present

## 2011-01-31 DIAGNOSIS — F329 Major depressive disorder, single episode, unspecified: Secondary | ICD-10-CM | POA: Diagnosis present

## 2011-01-31 DIAGNOSIS — M109 Gout, unspecified: Secondary | ICD-10-CM | POA: Diagnosis present

## 2011-01-31 DIAGNOSIS — J441 Chronic obstructive pulmonary disease with (acute) exacerbation: Secondary | ICD-10-CM | POA: Diagnosis present

## 2011-01-31 DIAGNOSIS — R410 Disorientation, unspecified: Secondary | ICD-10-CM | POA: Diagnosis not present

## 2011-01-31 DIAGNOSIS — J4489 Other specified chronic obstructive pulmonary disease: Secondary | ICD-10-CM | POA: Diagnosis present

## 2011-01-31 DIAGNOSIS — S72309A Unspecified fracture of shaft of unspecified femur, initial encounter for closed fracture: Secondary | ICD-10-CM | POA: Diagnosis present

## 2011-01-31 DIAGNOSIS — D649 Anemia, unspecified: Secondary | ICD-10-CM | POA: Diagnosis present

## 2011-01-31 DIAGNOSIS — D721 Eosinophilia, unspecified: Secondary | ICD-10-CM | POA: Diagnosis present

## 2011-01-31 DIAGNOSIS — S62109A Fracture of unspecified carpal bone, unspecified wrist, initial encounter for closed fracture: Secondary | ICD-10-CM | POA: Diagnosis present

## 2011-01-31 HISTORY — DX: Hyperlipidemia, unspecified: E78.5

## 2011-01-31 HISTORY — DX: Anemia, unspecified: D64.9

## 2011-01-31 HISTORY — DX: Fracture of unspecified part of neck of left femur, initial encounter for closed fracture: S72.002A

## 2011-01-31 LAB — COMPREHENSIVE METABOLIC PANEL
ALT: 15 U/L (ref 0–53)
Alkaline Phosphatase: 81 U/L (ref 39–117)
Chloride: 104 mEq/L (ref 96–112)
GFR calc Af Amer: 83 mL/min — ABNORMAL LOW (ref >90)
Glucose, Bld: 131 mg/dL — ABNORMAL HIGH (ref 70–99)
Potassium: 3.6 mEq/L (ref 3.5–5.1)
Sodium: 137 mEq/L (ref 135–145)
Total Bilirubin: 0.6 mg/dL (ref 0.3–1.2)
Total Protein: 6.7 g/dL (ref 6.0–8.3)

## 2011-01-31 LAB — DIFFERENTIAL
Eosinophils Absolute: 0.9 10*3/uL — ABNORMAL HIGH (ref 0.0–0.7)
Lymphocytes Relative: 8 % — ABNORMAL LOW (ref 12–46)
Lymphs Abs: 0.9 10*3/uL (ref 0.7–4.0)
Monocytes Relative: 4 % (ref 3–12)
Neutro Abs: 9.5 10*3/uL — ABNORMAL HIGH (ref 1.7–7.7)
Neutrophils Relative %: 80 % — ABNORMAL HIGH (ref 43–77)

## 2011-01-31 LAB — TYPE AND SCREEN

## 2011-01-31 LAB — CBC
Hemoglobin: 13.2 g/dL (ref 13.0–17.0)
MCH: 31.4 pg (ref 26.0–34.0)
Platelets: 126 10*3/uL — ABNORMAL LOW (ref 150–400)
RBC: 4.21 MIL/uL — ABNORMAL LOW (ref 4.22–5.81)
WBC: 11.8 10*3/uL — ABNORMAL HIGH (ref 4.0–10.5)

## 2011-01-31 MED ORDER — SODIUM CHLORIDE 0.9 % IV SOLN
INTRAVENOUS | Status: AC
  Administered 2011-01-31: 20:00:00 via INTRAVENOUS

## 2011-01-31 MED ORDER — HYDROMORPHONE HCL PF 1 MG/ML IJ SOLN: 1.0000 mg | INTRAMUSCULAR | Status: DC | PRN

## 2011-01-31 MED ORDER — HYDROMORPHONE HCL PF 1 MG/ML IJ SOLN
INTRAMUSCULAR | Status: AC
  Filled 2011-01-31: qty 1

## 2011-01-31 MED ORDER — HYDROMORPHONE HCL PF 1 MG/ML IJ SOLN
1.0000 mg | Freq: Once | INTRAMUSCULAR | Status: AC
  Administered 2011-01-31: 1 mg via INTRAVENOUS
  Filled 2011-01-31: qty 1

## 2011-01-31 MED ORDER — HYDROMORPHONE HCL PF 1 MG/ML IJ SOLN
1.0000 mg | Freq: Once | INTRAMUSCULAR | Status: AC
  Administered 2011-01-31: 1 mg via INTRAVENOUS

## 2011-01-31 MED ORDER — HYDROMORPHONE HCL PF 1 MG/ML IJ SOLN
1.0000 mg | INTRAMUSCULAR | Status: DC | PRN
  Administered 2011-01-31 – 2011-02-01 (×4): 2 mg via INTRAVENOUS
  Filled 2011-01-31 (×4): qty 2

## 2011-01-31 MED ORDER — SODIUM CHLORIDE 0.9 % IV SOLN
INTRAVENOUS | Status: DC
  Administered 2011-01-31 (×3): via INTRAVENOUS

## 2011-01-31 MED ORDER — SODIUM CHLORIDE 0.9 % IV SOLN
999.0000 mL | INTRAVENOUS | Status: DC
  Administered 2011-01-31: 1000 mL via INTRAVENOUS

## 2011-01-31 MED ORDER — ONDANSETRON HCL 4 MG/2ML IJ SOLN: 4.0000 mg | Freq: Three times a day (TID) | INTRAMUSCULAR | Status: DC | PRN

## 2011-01-31 MED ORDER — CYCLOBENZAPRINE HCL 10 MG PO TABS
10.0000 mg | ORAL_TABLET | Freq: Once | ORAL | Status: AC
  Administered 2011-01-31: 10 mg via ORAL
  Filled 2011-01-31: qty 1

## 2011-01-31 NOTE — ED Notes (Signed)
Gave report to [employer]

## 2011-01-31 NOTE — ED Notes (Signed)
Pt back from xray.  nad noted 

## 2011-01-31 NOTE — ED Provider Notes (Signed)
History    This chart was scribed for [employer]. Bruce Payor, MD, MD by Bruce [name]. The patient was seen in room APA11 and the patient's care was started at 3:52PM.   CSN: [id number]  Arrival date & time 01/31/11  1549   First MD Initiated Contact with Patient 01/31/11 1549      Chief Complaint  Patient presents with  . Fall    (Consider location/radiation/quality/duration/timing/severity/associated sxs/prior treatment) Patient is a 66 y.o. male presenting with fall. The history is provided by the patient.  Fall   Bruce [name] is a 66 y.o. male who presents to the Emergency Department BIB EMS complaining of moderate left leg and left wrist pain due to fall today from 4 foot ladder. Pt reports he fell directly onto left leg and wrist. Pain has been constant since onset without radiation. He reports not being able to walk. Pt denies LOC and head injury. Pt is alert and on spine board. Pt has history of gout, obesity, DJD and COPD. Pt is allergic to penicillin.   Past Medical History  Diagnosis Date  . Eosinophilia   . Thrombocytopenia   . COPD (chronic obstructive pulmonary disease)   . Obesity   . Degenerative joint disease   . Depression   . Gout     History reviewed. No pertinent past surgical history.  Family History  Problem Relation Age of Onset  . Cancer Mother   . Emphysema Father   . Cancer Sister   . Cancer Brother     History  Substance Use Topics  . Smoking status: Former Games developer  . Smokeless tobacco: Current User  . Alcohol Use: Yes      Review of Systems  All other systems reviewed and are negative.  10 Systems reviewed and are negative for acute change except as noted in the HPI.   Allergies  Penicillins  Home Medications   Current Outpatient Rx  Name Route Sig Dispense Refill  . ATORVASTATIN CALCIUM 10 MG PO TABS Oral Take 10 mg by mouth at bedtime.     . BUDESONIDE-FORMOTEROL FUMARATE 80-4.5 MCG/ACT IN AERO Inhalation Inhale 2 puffs  into the lungs 2 (two) times daily.      Marland Kitchen CITALOPRAM HYDROBROMIDE 20 MG PO TABS  TAKE ONE TABLET BY MOUTH EVERY DAY 30 tablet 3  . OMEGA-3 FATTY ACIDS 1000 MG PO CAPS Oral Take 1,000 mg by mouth at bedtime.       BP 161/77  Pulse 65  Temp(Src) 97.8 F (36.6 C) (Oral)  Resp 18  Ht 5' 7.5" (1.715 m)  Wt 262 lb (118.842 kg)  BMI 40.43 kg/m2  SpO2 99%  Physical Exam  Nursing note and vitals reviewed. Constitutional: He is oriented to person, place, and time. He appears well-developed and well-nourished. No distress.  HENT:  Head: Normocephalic and atraumatic.  Eyes: Conjunctivae and EOM are normal. Pupils are equal, round, and reactive to light.  Neck: Normal range of motion. Neck supple. No thyromegaly present.  Cardiovascular: Normal rate, regular rhythm and normal heart sounds.   Pulmonary/Chest: Effort normal and breath sounds normal. No respiratory distress. He has no wheezes.  Abdominal: Soft. There is no tenderness. There is no guarding.  Musculoskeletal: Normal range of motion. He exhibits tenderness.       Left snuff box tenderness. No c-spine tenderness  No paraspinal tenderness Tenderness above left knee Neurovascularly intact     Neurological: He is alert and oriented to person, place, and time.  Skin:  Skin is warm and dry.  Psychiatric: He has a normal mood and affect. His behavior is normal.    ED Course  Procedures (including critical care time)  DIAGNOSTIC STUDIES: Oxygen Saturation is 99% on room air, normal by my interpretation.    COORDINATION OF CARE:  16:00 EDP Ordered: Dilaudid and 0.9% sodium chloride infusion  17:00 EDP Ordered: 0.9% sodium chloride infusion  17:30 EDP Ordered: Dilaudid (1mg )   17:43 EDP decides to admit pt.    Labs Reviewed  CBC  DIFFERENTIAL  COMPREHENSIVE METABOLIC PANEL  TYPE AND SCREEN  URINALYSIS, ROUTINE W REFLEX MICROSCOPIC   Dg Chest 1 View  01/31/2011  *RADIOLOGY REPORT*  Clinical Data: Left hip fracture   CHEST - 1 VIEW  Comparison: 06/23/2009  Findings: Decreased lung volumes accentuate the heart size and vascular markings.  Negative for definite pneumonia, collapse, consolidation, effusion or pneumothorax.  No edema.  IMPRESSION: Low volume exam without acute chest process  Original Report Authenticated By: Bruce [name], M.D.   Dg Wrist Complete Left  01/31/2011  *RADIOLOGY REPORT*  Clinical Data: Fall, pain, femur fracture  LEFT WRIST - COMPLETE 3+ VIEW  Comparison: None.  Findings: Normal alignment.  Intact left distal radius and ulna. On the lateral view, there is a small ossified fragment along the carpal bones dorsally which could represent a small avulsion fracture from one of the carpal bones, possibly the triquetrum. Recommend correlation for point tenderness in this region.  IMPRESSION: Possible small dorsal avulsion fracture of the carpal bones, suspect the triquetrum.  Normal alignment.  Original Report Authenticated By: Bruce [name], M.D.   Dg Hip Complete Left  01/31/2011  *RADIOLOGY REPORT*  Clinical Data: Fall, left hip pain  LEFT HIP - COMPLETE 2+ VIEW  Comparison: None.  Findings: There is acute minimally-displaced fracture of the proximal left femur shaft extending through the intertrochanteric region.  Left hip joint aligned.  Visualized pelvis and right hip unremarkable.  IMPRESSION: Acute displaced fracture proximal left femur  Original Report Authenticated By: Bruce [name], M.D.   Dg Knee Complete 4 Views Left  01/31/2011  *RADIOLOGY REPORT*  Clinical Data: Fall, knee pain  LEFT KNEE - COMPLETE 4+ VIEW  Comparison: None.  Findings: Normal alignment without fracture or effusion.  Preserved joint spaces.  IMPRESSION: No acute finding.  Original Report Authenticated By: Bruce [name], M.D.     1. Fall   2. Hip fracture, intertrochanteric   3. Wrist fracture   4. COPD (chronic obstructive pulmonary disease)      Date: 01/31/2011  Rate: 51  Rhythm: sinus  bradycardia  QRS Axis: normal  Intervals: normal  ST/T Wave abnormalities: normal  Conduction Disutrbances:none  Narrative Interpretation:   Old EKG Reviewed: none available    MDM  Patient fell off a ladder. His left hip and knee pain. X-ray shows intertrochanteric fracture. He also has a likely triquetral fracture. I discussed with Dr. Hilda Lias. He requested admission to medicine. He'll likely to the patient at lunch tomorrow. He also requested a sugar tong of the left wrist. Patient will be admitted to medicine. I discussed with Dr. Lendell Caprice      I personally performed the services described in this documentation, which was scribed in my presence. The recorded information has been reviewed and considered.     Juliet Rude. Bruce Payor, MD 01/31/11 1744

## 2011-01-31 NOTE — H&P (Signed)
PCP:   Josue Hector, MD, MD   Chief Complaint:  Fall with multiple fractures today  HPI: Bruce [name] is an 66 y.o. obese Caucasian male.   Mild COPD with occasional use of albuterol inhalers; mild thrombocytopenia being evaluated by Dr. 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  He was brought to the emergency room where x-rays revealed the fracture of the left wrist, and the fracture of the left upper femur extending upward to the left intertrochanteric.  Orthopedic service requested a hospitalist admission.  Fall from the ladder was not associated with any dizziness palpitation shortness of breath chest pain nausea or vomiting. Patient's only complaint currently is the pain and muscle spasm in his left thigh.  Rewiew of Systems:  The patient denies anorexia, fever, weight loss,, vision loss, decreased hearing, hoarseness, chest pain, syncope, dyspnea on exertion, peripheral edema, balance deficits, hemoptysis, abdominal pain, melena, hematochezia, severe indigestion/heartburn, hematuria, incontinence, genital sores, muscle weakness, suspicious skin lesions, transient blindness, difficulty walking, depression, unusual weight change, abnormal bleeding, enlarged lymph nodes, angioedema, and breast masses.   Past Medical History  Diagnosis Date  . Eosinophilia   . Thrombocytopenia   . COPD (chronic obstructive pulmonary disease)   . Obesity   . Degenerative joint disease   . Depression   . Gout     History reviewed. No pertinent past surgical history.  Medications:  HOME MEDS: Prior to Admission medications   Medication Sig Start Date End Date Taking? Authorizing Provider  atorvastatin (LIPITOR) 10 MG tablet Take 10 mg by mouth at bedtime.    Yes Historical Provider, MD   budesonide-formoterol (SYMBICORT) 80-4.5 MCG/ACT inhaler Inhale 2 puffs into the lungs 2 (two) times daily.     Yes Historical Provider, MD  citalopram (CELEXA) 20 MG tablet TAKE ONE TABLET BY MOUTH EVERY DAY 01/20/11  Yes Randall An, MD  fish oil-omega-3 fatty acids 1000 MG capsule Take 1,000 mg by mouth at bedtime.    Yes Historical Provider, MD     Allergies:  Allergies  Allergen Reactions  . Penicillins     Social History:   reports that he has quit smoking. His smokeless tobacco use includes Chew. He reports that he drinks alcohol. He reports that he does not use illicit drugs.  Family History: Family History  Problem Relation Age of Onset  . Cancer Mother   . Emphysema Father   . Cancer Sister   . Cancer Brother      Physical Exam: Filed Vitals:   01/31/11 1544 01/31/11 1826 01/31/11 1927 01/31/11 2202  BP: 161/77 114/60 139/78 112/72  Pulse: 65 60 60 72  Temp: 97.8 F (36.6 C) 98.1 F (36.7 C) 98 F (36.7 C) 98.1 F (36.7 C)  TempSrc: Oral Oral Oral Oral  Resp: 18 20 20 20   Height: 5' 7.5" (1.715 m)  5' 7.5" (1.715 m)   Weight: 118.842 kg (262 lb)  120.3 kg (265 lb 3.4 oz)   SpO2: 99% 96% 97% 94%   Blood pressure 112/72, pulse 72, temperature 98.1 F (36.7 C), temperature source Oral, resp. rate 20, height 5' 7.5" (1.715 m), weight 120.3 kg (265 lb 3.4 oz), SpO2 94.00%.  GEN:  Pleasant middle-aged Caucasian man lying in the stretcher in painful distress; cooperative with exam  PSYCH:  alert and oriented x4;  affect is appropriate  HEENT: Mucous membranes pink and anicteric; PERRLA; EOM intact; no cervical lymphadenopathy thick neck Breasts:: Not examined CHEST WALL: No tenderness CHEST: Normal respiration, clear to auscultation bilaterally HEART: Regular rate and rhythm; no murmurs rubs or gallops BACK: No kyphosis or scoliosis; no CVA tenderness ABDOMEN: Obese, soft non-tender; no masses, no organomegaly, normal abdominal bowel sounds; no pannus; no  intertriginous candida. Rectal Exam: Not done EXTREMITIES: Deformity and bulge/swelling of the left upper thigh; ring and external rotation of the whole left lower extremity; no edema; good pulses bilaterally; no discoloration of toes. Genitalia: not examined PULSES: 2+ and symmetric SKIN: Normal hydration no rash or ulceration CNS: Cranial nerves 2-12 grossly intact no focal neurologic deficit   Labs & Imaging Results for orders placed during the hospital encounter of 01/31/11 (from the past 48 hour(s))  CBC     Status: Abnormal   Collection Time   01/31/11  5:31 PM      Component Value Range Comment   WBC 11.8 (*) 4.0 - 10.5 (K/uL)    RBC 4.21 (*) 4.22 - 5.81 (MIL/uL)    Hemoglobin 13.2  13.0 - 17.0 (g/dL)    HCT 21.3 (*) 08.6 - 52.0 (%)    MCV 90.7  78.0 - 100.0 (fL)    MCH 31.4  26.0 - 34.0 (pg)    MCHC 34.6  30.0 - 36.0 (g/dL)    RDW 57.8  46.9 - 62.9 (%)    Platelets 126 (*) 150 - 400 (K/uL)   DIFFERENTIAL     Status: Abnormal   Collection Time   01/31/11  5:31 PM      Component Value Range Comment   Neutrophils Relative 80 (*) 43 - 77 (%)    Neutro Abs 9.5 (*) 1.7 - 7.7 (K/uL)    Lymphocytes Relative 8 (*) 12 - 46 (%)    Lymphs Abs 0.9  0.7 - 4.0 (K/uL)    Monocytes Relative 4  3 - 12 (%)    Monocytes Absolute 0.5  0.1 - 1.0 (K/uL)    Eosinophils Relative 8 (*) 0 - 5 (%)    Eosinophils Absolute 0.9 (*) 0.0 - 0.7 (K/uL)    Basophils Relative 0  0 - 1 (%)    Basophils Absolute 0.0  0.0 - 0.1 (K/uL)   COMPREHENSIVE METABOLIC PANEL     Status: Abnormal   Collection Time   01/31/11  5:31 PM      Component Value Range Comment   Sodium 137  135 - 145 (mEq/L)    Potassium 3.6  3.5 - 5.1 (mEq/L)    Chloride 104  96 - 112 (mEq/L)    CO2 26  19 - 32 (mEq/L)    Glucose, Bld 131 (*) 70 - 99 (mg/dL)    BUN 17  6 - 23 (mg/dL)    Creatinine, Ser 5.28  0.50 - 1.35 (mg/dL)    Calcium 9.2  8.4 - 10.5 (mg/dL)    Total Protein 6.7  6.0 - 8.3 (g/dL)    Albumin 3.7  3.5 - 5.2 (g/dL)      AST 17  0 - 37 (U/L)    ALT 15  0 - 53 (U/L)    Alkaline Phosphatase 81  39 - 117 (U/L)    Total Bilirubin 0.6  0.3 - 1.2 (mg/dL)    GFR calc non Af Amer 72 (*) >90 (mL/min)    GFR calc Af Amer 83 (*) >  90 (mL/min)   TYPE AND SCREEN     Status: Normal   Collection Time   01/31/11  5:35 PM      Component Value Range Comment   ABO/RH(D) O POS      Antibody Screen NEG      Sample Expiration 02/03/2011      Dg Chest 1 View  01/31/2011  *RADIOLOGY REPORT*  Clinical Data: Left hip fracture  CHEST - 1 VIEW  Comparison: 06/23/2009  Findings: Decreased lung volumes accentuate the heart size and vascular markings.  Negative for definite pneumonia, collapse, consolidation, effusion or pneumothorax.  No edema.  IMPRESSION: Low volume exam without acute chest process  Original Report Authenticated By: Judie Petit. Ruel Favors, M.D.   Dg Wrist Complete Left  01/31/2011  *RADIOLOGY REPORT*  Clinical Data: Fall, pain, femur fracture  LEFT WRIST - COMPLETE 3+ VIEW  Comparison: None.  Findings: Normal alignment.  Intact left distal radius and ulna. On the lateral view, there is a small ossified fragment along the carpal bones dorsally which could represent a small avulsion fracture from one of the carpal bones, possibly the triquetrum. Recommend correlation for point tenderness in this region.  IMPRESSION: Possible small dorsal avulsion fracture of the carpal bones, suspect the triquetrum.  Normal alignment.  Original Report Authenticated By: Judie Petit. Ruel Favors, M.D.   Dg Hip Complete Left  01/31/2011  *RADIOLOGY REPORT*  Clinical Data: Fall, left hip pain  LEFT HIP - COMPLETE 2+ VIEW  Comparison: None.  Findings: There is acute minimally-displaced fracture of the proximal left femur shaft extending through the intertrochanteric region.  Left hip joint aligned.  Visualized pelvis and right hip unremarkable.  IMPRESSION: Acute displaced fracture proximal left femur  Original Report Authenticated By: Judie Petit. Ruel Favors, M.D.    Dg Knee Complete 4 Views Left  01/31/2011  *RADIOLOGY REPORT*  Clinical Data: Fall, knee pain  LEFT KNEE - COMPLETE 4+ VIEW  Comparison: None.  Findings: Normal alignment without fracture or effusion.  Preserved joint spaces.  IMPRESSION: No acute finding.  Original Report Authenticated By: Judie Petit. Ruel Favors, M.D.      Assessment Present on Admission:  .COPD (chronic obstructive pulmonary disease) .Hip fracture, intertrochanteric .Wrist fracture .Eosinophilia .Thrombocytopenia   PLAN: Since this gentleman has good exercise capacity, and has only mild COPD, put him on a low dose beta blocker, serial nebulization, and he can be cleared as low to moderate risk for this surgery.  We'll keep him n.p.o. after midnight in case surgery is planned for tomorrow.  Other plans as per orders.    Sheneka Schrom 01/31/2011, 10:50 PM

## 2011-01-31 NOTE — ED Notes (Signed)
Called to give report to Val RN, she to call me back 

## 2011-01-31 NOTE — ED Notes (Signed)
Per ems, pt fell from a 76ft ladder onto his left side.  Pt reports falling directly onto his left leg and wrist.  Pt denies and LOC or head injury.  Pt is alert and oriented, on spine board.

## 2011-02-01 ENCOUNTER — Inpatient Hospital Stay (HOSPITAL_COMMUNITY): Payer: BC Managed Care – PPO | Admitting: Anesthesiology

## 2011-02-01 ENCOUNTER — Encounter (HOSPITAL_COMMUNITY): Payer: Self-pay | Admitting: Anesthesiology

## 2011-02-01 ENCOUNTER — Encounter (HOSPITAL_COMMUNITY)
Admission: EM | Disposition: A | Discharge status: Rehabilitation Facility | Payer: Self-pay | Source: Home / Self Care | Attending: Internal Medicine

## 2011-02-01 ENCOUNTER — Inpatient Hospital Stay (HOSPITAL_COMMUNITY): Payer: BC Managed Care – PPO

## 2011-02-01 ENCOUNTER — Encounter (HOSPITAL_COMMUNITY): Payer: Self-pay | Admitting: *Deleted

## 2011-02-01 HISTORY — PX: ORIF FEMUR FRACTURE: SHX2119

## 2011-02-01 HISTORY — PX: COMPRESSION HIP SCREW: SHX1386

## 2011-02-01 LAB — BASIC METABOLIC PANEL
BUN: 15 mg/dL (ref 6–23)
Chloride: 104 mEq/L (ref 96–112)
GFR calc Af Amer: >90 mL/min (ref >90)
Potassium: 3.8 mEq/L (ref 3.5–5.1)

## 2011-02-01 LAB — PROTIME-INR
INR: 1.23 (ref 0.00–1.49)
Prothrombin Time: 15.8 seconds — ABNORMAL HIGH (ref 11.6–15.2)

## 2011-02-01 LAB — CBC
HCT: 33.9 % — ABNORMAL LOW (ref 39.0–52.0)
Hemoglobin: 11.6 g/dL — ABNORMAL LOW (ref 13.0–17.0)
MCHC: 34.2 g/dL (ref 30.0–36.0)
WBC: 8.7 10*3/uL (ref 4.0–10.5)

## 2011-02-01 LAB — HEMOGLOBIN AND HEMATOCRIT, BLOOD: HCT: 35.8 % — ABNORMAL LOW (ref 39.0–52.0)

## 2011-02-01 LAB — PREPARE RBC (CROSSMATCH)

## 2011-02-01 LAB — TSH: TSH: 3.128 u[IU]/mL (ref 0.350–4.500)

## 2011-02-01 SURGERY — COMPRESSION HIP
Anesthesia: Spinal | Site: Hip | Laterality: Left | Wound class: Clean

## 2011-02-01 MED ORDER — LIDOCAINE HCL (PF) 1 % IJ SOLN
INTRAMUSCULAR | Status: AC
  Filled 2011-02-01: qty 5

## 2011-02-01 MED ORDER — ALBUTEROL SULFATE (5 MG/ML) 0.5% IN NEBU: 2.5000 mg | INHALATION_SOLUTION | RESPIRATORY_TRACT | Status: DC | PRN

## 2011-02-01 MED ORDER — VANCOMYCIN HCL IN DEXTROSE 1-5 GM/200ML-% IV SOLN
1000.0000 mg | Freq: Once | INTRAVENOUS | Status: AC
  Administered 2011-02-02: 1000 mg via INTRAVENOUS
  Filled 2011-02-01: qty 200

## 2011-02-01 MED ORDER — BUDESONIDE-FORMOTEROL FUMARATE 80-4.5 MCG/ACT IN AERO
2.0000 | INHALATION_SPRAY | Freq: Two times a day (BID) | RESPIRATORY_TRACT | Status: DC
  Administered 2011-02-01 – 2011-02-07 (×12): 2 via RESPIRATORY_TRACT
  Filled 2011-02-01: qty 6.9

## 2011-02-01 MED ORDER — ATENOLOL 25 MG PO TABS
25.0000 mg | ORAL_TABLET | Freq: Every day | ORAL | Status: DC
  Administered 2011-02-01 – 2011-02-04 (×5): 25 mg via ORAL
  Filled 2011-02-01 (×5): qty 1

## 2011-02-01 MED ORDER — EPHEDRINE SULFATE 50 MG/ML IJ SOLN
INTRAMUSCULAR | Status: AC
  Filled 2011-02-01: qty 1

## 2011-02-01 MED ORDER — ONDANSETRON HCL 4 MG/2ML IJ SOLN: 4.0000 mg | Freq: Once | INTRAMUSCULAR | Status: DC | PRN

## 2011-02-01 MED ORDER — ALBUTEROL SULFATE (5 MG/ML) 0.5% IN NEBU
2.5000 mg | INHALATION_SOLUTION | Freq: Four times a day (QID) | RESPIRATORY_TRACT | Status: DC
  Administered 2011-02-01 – 2011-02-02 (×4): 2.5 mg via RESPIRATORY_TRACT
  Filled 2011-02-01 (×4): qty 0.5

## 2011-02-01 MED ORDER — HYDROMORPHONE 0.3 MG/ML IV SOLN
INTRAVENOUS | Status: DC
Start: 2011-02-01 — End: 2011-02-04
  Administered 2011-02-01: 17:00:00 via INTRAVENOUS
  Administered 2011-02-02: 2.7 mg via INTRAVENOUS
  Administered 2011-02-02: 2.4 mg via INTRAVENOUS
  Administered 2011-02-03: 0.6 mg via INTRAVENOUS
  Administered 2011-02-03: 1.98 mg via INTRAVENOUS
  Administered 2011-02-03: 0.3 mg via INTRAVENOUS
  Administered 2011-02-03: 02:00:00 via INTRAVENOUS
  Administered 2011-02-04: 0.9 mg via INTRAVENOUS

## 2011-02-01 MED ORDER — PROPOFOL 10 MG/ML IV EMUL
INTRAVENOUS | Status: DC | PRN
  Administered 2011-02-01: 50 ug/kg/min via INTRAVENOUS
  Administered 2011-02-01: 13:00:00 via INTRAVENOUS

## 2011-02-01 MED ORDER — FENTANYL CITRATE 0.05 MG/ML IJ SOLN
INTRAMUSCULAR | Status: AC
  Administered 2011-02-01: 25 ug via INTRAVENOUS
  Filled 2011-02-01: qty 2

## 2011-02-01 MED ORDER — SODIUM CHLORIDE 0.9 % IR SOLN
Status: DC | PRN
  Administered 2011-02-01: 1000 mL

## 2011-02-01 MED ORDER — MIDAZOLAM HCL 2 MG/2ML IJ SOLN
1.0000 mg | INTRAMUSCULAR | Status: DC | PRN
  Administered 2011-02-01: 2 mg via INTRAVENOUS

## 2011-02-01 MED ORDER — BUPIVACAINE HCL 0.75 % IJ SOLN
INTRAMUSCULAR | Status: DC | PRN
  Administered 2011-02-01: 2 mL via INTRATHECAL

## 2011-02-01 MED ORDER — ENOXAPARIN SODIUM 40 MG/0.4ML ~~LOC~~ SOLN
40.0000 mg | SUBCUTANEOUS | Status: DC
  Administered 2011-02-01 – 2011-02-06 (×6): 40 mg via SUBCUTANEOUS
  Filled 2011-02-01 (×6): qty 0.4

## 2011-02-01 MED ORDER — TRAZODONE HCL 50 MG PO TABS
25.0000 mg | ORAL_TABLET | Freq: Every evening | ORAL | Status: DC | PRN
  Administered 2011-02-07: 25 mg via ORAL
  Filled 2011-02-01: qty 1

## 2011-02-01 MED ORDER — OMEGA-3-ACID ETHYL ESTERS 1 G PO CAPS
1.0000 g | ORAL_CAPSULE | Freq: Every day | ORAL | Status: DC
  Administered 2011-02-01 – 2011-02-06 (×6): 1 g via ORAL
  Filled 2011-02-01 (×6): qty 1

## 2011-02-01 MED ORDER — LIDOCAINE HCL (CARDIAC) 10 MG/ML IV SOLN
INTRAVENOUS | Status: DC | PRN
  Administered 2011-02-01: 25 mg via INTRAVENOUS

## 2011-02-01 MED ORDER — DIPHENHYDRAMINE HCL 12.5 MG/5ML PO ELIX
12.5000 mg | ORAL_SOLUTION | Freq: Four times a day (QID) | ORAL | Status: DC | PRN
  Administered 2011-02-02 (×2): 12.5 mg via ORAL
  Filled 2011-02-01 (×2): qty 5

## 2011-02-01 MED ORDER — MIDAZOLAM HCL 2 MG/2ML IJ SOLN
INTRAMUSCULAR | Status: AC
  Administered 2011-02-01: 2 mg via INTRAVENOUS
  Filled 2011-02-01: qty 2

## 2011-02-01 MED ORDER — HYDROGEN PEROXIDE 3 % EX SOLN
CUTANEOUS | Status: DC | PRN
  Administered 2011-02-01: 1

## 2011-02-01 MED ORDER — ZOLPIDEM TARTRATE 5 MG PO TABS
5.0000 mg | ORAL_TABLET | Freq: Every day | ORAL | Status: DC
  Administered 2011-02-01 – 2011-02-03 (×3): 5 mg via ORAL
  Filled 2011-02-01 (×3): qty 1

## 2011-02-01 MED ORDER — MAGNESIUM HYDROXIDE 400 MG/5ML PO SUSP: 30.0000 mL | Freq: Every day | ORAL | Status: DC | PRN

## 2011-02-01 MED ORDER — HYDROMORPHONE 0.3 MG/ML IV SOLN
INTRAVENOUS | Status: AC
  Filled 2011-02-01: qty 25

## 2011-02-01 MED ORDER — ONDANSETRON HCL 4 MG/2ML IJ SOLN: 4.0000 mg | Freq: Four times a day (QID) | INTRAMUSCULAR | Status: DC | PRN

## 2011-02-01 MED ORDER — LACTATED RINGERS IV SOLN
INTRAVENOUS | Status: DC
  Administered 2011-02-01: 12:00:00 via INTRAVENOUS

## 2011-02-01 MED ORDER — PROPOFOL 10 MG/ML IV EMUL
INTRAVENOUS | Status: AC
  Filled 2011-02-01: qty 20

## 2011-02-01 MED ORDER — FENTANYL CITRATE 0.05 MG/ML IJ SOLN
INTRAMUSCULAR | Status: DC | PRN
  Administered 2011-02-01: 25 ug via INTRAVENOUS

## 2011-02-01 MED ORDER — BUPIVACAINE IN DEXTROSE 0.75-8.25 % IT SOLN
INTRATHECAL | Status: AC
  Filled 2011-02-01: qty 2

## 2011-02-01 MED ORDER — FLEET ENEMA 7-19 GM/118ML RE ENEM: 1.0000 | ENEMA | Freq: Once | RECTAL | Status: AC | PRN

## 2011-02-01 MED ORDER — PROMETHAZINE HCL 25 MG/ML IJ SOLN: 12.5000 mg | INTRAMUSCULAR | Status: DC | PRN

## 2011-02-01 MED ORDER — VANCOMYCIN HCL IN DEXTROSE 1-5 GM/200ML-% IV SOLN
INTRAVENOUS | Status: AC
  Filled 2011-02-01: qty 200

## 2011-02-01 MED ORDER — PROPOFOL 10 MG/ML IV EMUL: INTRAVENOUS | Status: DC | PRN

## 2011-02-01 MED ORDER — ACETAMINOPHEN 325 MG PO TABS: 650.0000 mg | ORAL_TABLET | Freq: Four times a day (QID) | ORAL | Status: DC | PRN

## 2011-02-01 MED ORDER — VANCOMYCIN HCL 1000 MG IV SOLR
1000.0000 mg | INTRAVENOUS | Status: DC | PRN
  Administered 2011-02-01: 1000 mg via INTRAVENOUS

## 2011-02-01 MED ORDER — DOCUSATE SODIUM 100 MG PO CAPS
100.0000 mg | ORAL_CAPSULE | Freq: Two times a day (BID) | ORAL | Status: DC
  Administered 2011-02-01: 100 mg via ORAL
  Filled 2011-02-01: qty 1

## 2011-02-01 MED ORDER — BISACODYL 10 MG RE SUPP: 10.0000 mg | Freq: Every day | RECTAL | Status: DC | PRN

## 2011-02-01 MED ORDER — DIPHENHYDRAMINE HCL 50 MG/ML IJ SOLN: 12.5000 mg | Freq: Four times a day (QID) | INTRAMUSCULAR | Status: DC | PRN

## 2011-02-01 MED ORDER — HYDROMORPHONE 0.3 MG/ML IV SOLN
INTRAVENOUS | Status: AC
  Administered 2011-02-01: 23:00:00
  Filled 2011-02-01: qty 25

## 2011-02-01 MED ORDER — ACETAMINOPHEN 325 MG PO TABS
650.0000 mg | ORAL_TABLET | ORAL | Status: DC | PRN
  Administered 2011-02-02 – 2011-02-05 (×2): 650 mg via ORAL
  Filled 2011-02-01 (×2): qty 2

## 2011-02-01 MED ORDER — NALOXONE HCL 0.4 MG/ML IJ SOLN: 0.4000 mg | INTRAMUSCULAR | Status: DC | PRN

## 2011-02-01 MED ORDER — POTASSIUM CHLORIDE IN NACL 20-0.9 MEQ/L-% IV SOLN
INTRAVENOUS | Status: DC
  Administered 2011-02-01 – 2011-02-04 (×6): via INTRAVENOUS

## 2011-02-01 MED ORDER — LACTATED RINGERS IV SOLN
INTRAVENOUS | Status: DC | PRN
  Administered 2011-02-01 (×2): via INTRAVENOUS

## 2011-02-01 MED ORDER — VANCOMYCIN HCL 1000 MG IV SOLR
1000.0000 mg | INTRAVENOUS | Status: DC
  Filled 2011-02-01: qty 1000

## 2011-02-01 MED ORDER — SODIUM CHLORIDE 0.9 % IJ SOLN: 9.0000 mL | INTRAMUSCULAR | Status: DC | PRN

## 2011-02-01 MED ORDER — VANCOMYCIN HCL IN DEXTROSE 1-5 GM/200ML-% IV SOLN: 1000.0000 mg | Freq: Once | INTRAVENOUS | Status: DC

## 2011-02-01 MED ORDER — METHOCARBAMOL 500 MG PO TABS
1000.0000 mg | ORAL_TABLET | Freq: Four times a day (QID) | ORAL | Status: DC | PRN
  Administered 2011-02-01 – 2011-02-02 (×3): 1000 mg via ORAL
  Filled 2011-02-01 (×3): qty 2

## 2011-02-01 MED ORDER — ACETAMINOPHEN 10 MG/ML IV SOLN
1000.0000 mg | Freq: Four times a day (QID) | INTRAVENOUS | Status: AC
  Administered 2011-02-01 – 2011-02-02 (×4): 1000 mg via INTRAVENOUS
  Filled 2011-02-01 (×4): qty 100

## 2011-02-01 MED ORDER — ACETAMINOPHEN 650 MG RE SUPP: 650.0000 mg | RECTAL | Status: DC | PRN

## 2011-02-01 MED ORDER — OMEGA-3 FATTY ACIDS 1000 MG PO CAPS: 1000.0000 mg | ORAL_CAPSULE | Freq: Every day | ORAL | Status: DC

## 2011-02-01 MED ORDER — ONDANSETRON HCL 4 MG/2ML IJ SOLN: 4.0000 mg | INTRAMUSCULAR | Status: AC | PRN

## 2011-02-01 MED ORDER — FENTANYL CITRATE 0.05 MG/ML IJ SOLN
25.0000 ug | INTRAMUSCULAR | Status: DC | PRN
  Administered 2011-02-01: 25 ug via INTRAVENOUS
  Administered 2011-02-01: 50 ug via INTRAVENOUS
  Administered 2011-02-01 (×2): 25 ug via INTRAVENOUS
  Administered 2011-02-01: 50 ug via INTRAVENOUS
  Administered 2011-02-01: 25 ug via INTRAVENOUS

## 2011-02-01 MED ORDER — FENTANYL CITRATE 0.05 MG/ML IJ SOLN
INTRAMUSCULAR | Status: AC
  Administered 2011-02-01: 50 ug via INTRAVENOUS
  Filled 2011-02-01: qty 2

## 2011-02-01 MED ORDER — FENTANYL CITRATE 0.05 MG/ML IJ SOLN
INTRAMUSCULAR | Status: DC | PRN
  Administered 2011-02-01: 25 ug via INTRATHECAL

## 2011-02-01 MED ORDER — SIMVASTATIN 20 MG PO TABS
20.0000 mg | ORAL_TABLET | Freq: Every day | ORAL | Status: DC
  Administered 2011-02-02 – 2011-02-07 (×6): 20 mg via ORAL
  Filled 2011-02-01 (×6): qty 1

## 2011-02-01 SURGICAL SUPPLY — 57 items
BAG HAMPER (MISCELLANEOUS) ×2 IMPLANT
BLADE SURG SZ10 CARB STEEL (BLADE) ×4 IMPLANT
BLADE SURG SZ20 CARB STEEL (BLADE) ×2 IMPLANT
CLOTH BEACON ORANGE TIMEOUT ST (SAFETY) ×2 IMPLANT
COVER LIGHT HANDLE STERIS (MISCELLANEOUS) ×4 IMPLANT
COVER MAYO STAND XLG (DRAPE) ×2 IMPLANT
DRAPE STERI IOBAN 125X83 (DRAPES) ×2 IMPLANT
DRILL OMEGA BONE (BIT) ×2 IMPLANT
ELECT REM PT RETURN 9FT ADLT (ELECTROSURGICAL) ×2
ELECTRODE REM PT RTRN 9FT ADLT (ELECTROSURGICAL) ×1 IMPLANT
EVACUATOR 3/16  PVC DRAIN (DRAIN) ×1
EVACUATOR 3/16 PVC DRAIN (DRAIN) ×1 IMPLANT
GAUZE XEROFORM 5X9 LF (GAUZE/BANDAGES/DRESSINGS) ×2 IMPLANT
GLOVE BIO SURGEON STRL SZ8 (GLOVE) ×2 IMPLANT
GLOVE BIO SURGEON STRL SZ8.5 (GLOVE) ×3 IMPLANT
GLOVE BIOGEL PI IND STRL 7.0 (GLOVE) IMPLANT
GLOVE BIOGEL PI IND STRL 7.5 (GLOVE) IMPLANT
GLOVE BIOGEL PI INDICATOR 7.0 (GLOVE) ×3
GLOVE BIOGEL PI INDICATOR 7.5 (GLOVE) ×1
GLOVE ECLIPSE 6.5 STRL STRAW (GLOVE) ×2 IMPLANT
GLOVE ECLIPSE 7.0 STRL STRAW (GLOVE) ×1 IMPLANT
GLOVE EXAM NITRILE MD LF STRL (GLOVE) ×1 IMPLANT
GOWN STRL REIN XL XLG (GOWN DISPOSABLE) ×8 IMPLANT
GUIDE PIN CALIBRATED (PIN) ×2 IMPLANT
GUIDE PIN CALIBRATED 2.4X23 (PIN) ×1 IMPLANT
INST SET MAJOR BONE (KITS) ×2 IMPLANT
KIT BLADEGUARD II DBL (SET/KITS/TRAYS/PACK) ×2 IMPLANT
KIT ROOM TURNOVER AP CYSTO (KITS) ×2 IMPLANT
MANIFOLD NEPTUNE II (INSTRUMENTS) ×2 IMPLANT
MARKER SKIN DUAL TIP RULER LAB (MISCELLANEOUS) ×2 IMPLANT
NS IRRIG 1000ML POUR BTL (IV SOLUTION) ×2 IMPLANT
PACK BASIC III (CUSTOM PROCEDURE TRAY) ×2
PACK SRG BSC III STRL LF ECLPS (CUSTOM PROCEDURE TRAY) ×1 IMPLANT
PAD ABD 5X9 TENDERSORB (GAUZE/BANDAGES/DRESSINGS) ×3 IMPLANT
PAD ARMBOARD 7.5X6 YLW CONV (MISCELLANEOUS) ×2 IMPLANT
PENCIL HANDSWITCHING (ELECTRODE) ×2 IMPLANT
PLATE LONG 135X8 HOLE (Plate) ×1 IMPLANT
PUTTY DBX 5CC (Putty) ×1 IMPLANT
SCREW CORTICAL 48MM (Screw) ×2 IMPLANT
SCREW CORTICAL SFTP 4.5X42MM (Screw) ×2 IMPLANT
SCREW CORTICAL SFTP 4.5X44MM (Screw) ×3 IMPLANT
SCREW CORTICAL SFTP 4.5X50MM (Screw) ×1 IMPLANT
SCREW LAG 95MM (Screw) ×2 IMPLANT
SCREW LAGSTD 95X21X12.7X9 (Screw) IMPLANT
SET BASIN LINEN APH (SET/KITS/TRAYS/PACK) ×2 IMPLANT
SLEEVE BEADED CABLE (Orthopedic Implant) ×2 IMPLANT
SPONGE GAUZE 4X4 12PLY (GAUZE/BANDAGES/DRESSINGS) ×2 IMPLANT
SPONGE LAP 18X18 X RAY DECT (DISPOSABLE) ×6 IMPLANT
STAPLER VISISTAT 35W (STAPLE) ×2 IMPLANT
SUT BRALON NAB BRD #1 30IN (SUTURE) ×8 IMPLANT
SUT ETHILON NAB BLK LR #2 30IN (SUTURE) ×1 IMPLANT
SUT PLAIN 2 0 XLH (SUTURE) ×3 IMPLANT
SUT SILK 0 FSL (SUTURE) ×2 IMPLANT
SYR BULB IRRIGATION 50ML (SYRINGE) ×2 IMPLANT
TAPE MEDIFIX FOAM 3 (GAUZE/BANDAGES/DRESSINGS) ×2 IMPLANT
YANKAUER SUCT 12FT TUBE ARGYLE (SUCTIONS) ×2 IMPLANT
YANKAUER SUCT BULB TIP 10FT TU (MISCELLANEOUS) ×2 IMPLANT

## 2011-02-01 NOTE — Anesthesia Procedure Notes (Addendum)
Performed by: Corena Pilgrim    Date/Time: 02/01/2011 11:37 AM Performed by: Carolyne Littles, AMY Pre-anesthesia Checklist: Patient identified, Patient being monitored, Emergency Drugs available, Timeout performed and Suction available Oxygen Delivery Method: Simple face mask    Spinal  Start time: 02/01/2011 12:10 PM Staffing Anesthesiologist: Laurene Footman CRNA/Resident: ANDRAZA, AMY Preanesthetic Checklist Completed: patient identified, site marked, surgical consent, pre-op evaluation, timeout performed, IV checked, risks and benefits discussed and monitors and equipment checked Spinal Block Patient position: left lateral decubitus Prep: Betadine Patient monitoring: heart rate, cardiac monitor, continuous pulse ox and blood pressure Approach: left paramedian Location: L3-4 Injection technique: single-shot Needle Needle type: Spinocan  Needle length: 12.7 cm Assessment Sensory level: T8 Additional Notes 1210-- .75 % sensorcaine with Fentanyl 25 mcg injected by Dr. Marcos Eke on 2nd attempt.  Attempt x1 by Carolyne Littles, CRNA; Patient tolerated well.  Lot [id number] Exp 2013 11     Narrative:

## 2011-02-01 NOTE — Brief Op Note (Signed)
01/31/2011 - 02/01/2011  2:40 PM  PATIENT:  Bruce [name]  66 y.o. male  PRE-OPERATIVE DIAGNOSIS:  left hip fracture, intertrochanteric with subtroanteric extension and fracture femoral shaft laterally  POST-OPERATIVE DIAGNOSIS:  left hip fracture, same  PROCEDURE:  Procedure(s): Open treatment Internal Reduction of left hip intertrochanteric fracture, with subtrochanteric extension and OTIR of proximal lateral shaft to mid shaft fracture of femur on the left  SURGEON:  Surgeon(s): Darreld Mclean, MD  PHYSICIAN ASSISTANT:   ASSISTANTS: none   ANESTHESIA:   spinal  EBL:  Total I/O In: 1000 [I.V.:1000] Out: 1000 [Urine:700; Blood:300]  BLOOD ADMINISTERED:none  DRAINS: (Large) Hemovact drain(s) in the left hip with  Suction Open   LOCAL MEDICATIONS USED:  NONE  SPECIMEN:  No Specimen  DISPOSITION OF SPECIMEN:  N/A  COUNTS:  YES  TOURNIQUET:  * No tourniquets in log *  DICTATION: .Other Dictation: Dictation Number [id number]  PLAN OF CARE: Admit to inpatient   PATIENT DISPOSITION:  PACU - hemodynamically stable.   Delay start of Pharmacological VTE agent (>24hrs) due to surgical blood loss or risk of bleeding:  no

## 2011-02-01 NOTE — Progress Notes (Signed)
We received a PT consult request.  Because hip has not yet been stabilized, we will hold any PT until Orthopedic order is received

## 2011-02-01 NOTE — Consult Note (Signed)
Reason for Consult:Fracture of the left hip Referring Physician: Hospitalist  Bruce [name] is an 66 y.o. male.  HPI: He fell yesterday and hurt his left hip and thigh.  He was taken to the ER.  Xrays show a nondisplaced intertrochanteric fracture of the left hip and a fracture of the lateral femoral shaft extending down the shaft all laterally with slight displacement.  He has no other injury.  I have talked to the patient, his wife and his daughter about the fracture and the need for surgery.  I have discussed risks and imponderables including infection, embolus that could cause death, nerve injury, possible need for blood transfusion, need for physical therapy post op and anesthesia risks.  They asked appropriate questions.  I have mentioned possible skilled nursing home care but they would rather take him home with home health.  They are agreeable to the procedure.  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  Dr. Lysbeth Galas is his family doctor.  Past Medical History  Diagnosis Date  . Eosinophilia   . Thrombocytopenia   . COPD (chronic obstructive pulmonary disease)   . Obesity   . Degenerative joint disease   . Depression   . Gout     History reviewed. No pertinent past surgical history.  Family History  Problem Relation Age of Onset  . Cancer Mother   . Emphysema Father   . Cancer Sister   . Cancer Brother     Social History:  reports that he has quit smoking. His smokeless tobacco use includes Chew. He reports that he drinks alcohol. He reports that he does not use illicit drugs.  Allergies:  Allergies  Allergen Reactions  . Penicillins     Medications: I have reviewed the patient's current medications.  Results for orders placed during the hospital encounter of 01/31/11 (from the past 48 hour(s))  CBC     Status: Abnormal   Collection Time   01/31/11  5:31 PM      Component Value Range Comment   WBC 11.8 (*) 4.0 - 10.5 (K/uL)    RBC 4.21  (*) 4.22 - 5.81 (MIL/uL)    Hemoglobin 13.2  13.0 - 17.0 (g/dL)    HCT 29.5 (*) 62.1 - 52.0 (%)    MCV 90.7  78.0 - 100.0 (fL)    MCH 31.4  26.0 - 34.0 (pg)    MCHC 34.6  30.0 - 36.0 (g/dL)    RDW 30.8  65.7 - 84.6 (%)    Platelets 126 (*) 150 - 400 (K/uL)   DIFFERENTIAL     Status: Abnormal   Collection Time   01/31/11  5:31 PM      Component Value Range Comment   Neutrophils Relative 80 (*) 43 - 77 (%)    Neutro Abs 9.5 (*) 1.7 - 7.7 (K/uL)    Lymphocytes Relative 8 (*) 12 - 46 (%)    Lymphs Abs 0.9  0.7 - 4.0 (K/uL)    Monocytes Relative 4  3 - 12 (%)    Monocytes Absolute 0.5  0.1 - 1.0 (K/uL)    Eosinophils Relative 8 (*) 0 - 5 (%)    Eosinophils Absolute 0.9 (*) 0.0 - 0.7 (K/uL)    Basophils Relative 0  0 - 1 (%)    Basophils Absolute 0.0  0.0 - 0.1 (K/uL)   COMPREHENSIVE METABOLIC PANEL     Status: Abnormal   Collection Time   01/31/11  5:31 PM  Component Value Range Comment   Sodium 137  135 - 145 (mEq/L)    Potassium 3.6  3.5 - 5.1 (mEq/L)    Chloride 104  96 - 112 (mEq/L)    CO2 26  19 - 32 (mEq/L)    Glucose, Bld 131 (*) 70 - 99 (mg/dL)    BUN 17  6 - 23 (mg/dL)    Creatinine, Ser 1.61  0.50 - 1.35 (mg/dL)    Calcium 9.2  8.4 - 10.5 (mg/dL)    Total Protein 6.7  6.0 - 8.3 (g/dL)    Albumin 3.7  3.5 - 5.2 (g/dL)    AST 17  0 - 37 (U/L)    ALT 15  0 - 53 (U/L)    Alkaline Phosphatase 81  39 - 117 (U/L)    Total Bilirubin 0.6  0.3 - 1.2 (mg/dL)    GFR calc non Af Amer 72 (*) >90 (mL/min)    GFR calc Af Amer 83 (*) >90 (mL/min)   TYPE AND SCREEN     Status: Normal   Collection Time   01/31/11  5:35 PM      Component Value Range Comment   ABO/RH(D) O POS      Antibody Screen NEG      Sample Expiration 02/03/2011     BASIC METABOLIC PANEL     Status: Abnormal   Collection Time   02/01/11  5:16 AM      Component Value Range Comment   Sodium 136  135 - 145 (mEq/L)    Potassium 3.8  3.5 - 5.1 (mEq/L)    Chloride 104  96 - 112 (mEq/L)    CO2 26  19 - 32  (mEq/L)    Glucose, Bld 150 (*) 70 - 99 (mg/dL)    BUN 15  6 - 23 (mg/dL)    Creatinine, Ser 0.96  0.50 - 1.35 (mg/dL)    Calcium 8.7  8.4 - 10.5 (mg/dL)    GFR calc non Af Amer 86 (*) >90 (mL/min)    GFR calc Af Amer >90  >90 (mL/min)   CBC     Status: Abnormal   Collection Time   02/01/11  5:16 AM      Component Value Range Comment   WBC 8.7  4.0 - 10.5 (K/uL)    RBC 3.70 (*) 4.22 - 5.81 (MIL/uL)    Hemoglobin 11.6 (*) 13.0 - 17.0 (g/dL)    HCT 04.5 (*) 40.9 - 52.0 (%)    MCV 91.6  78.0 - 100.0 (fL)    MCH 31.4  26.0 - 34.0 (pg)    MCHC 34.2  30.0 - 36.0 (g/dL)    RDW 81.1  91.4 - 78.2 (%)    Platelets 119 (*) 150 - 400 (K/uL)   MAGNESIUM     Status: Normal   Collection Time   02/01/11  5:16 AM      Component Value Range Comment   Magnesium 1.8  1.5 - 2.5 (mg/dL)   PROTIME-INR     Status: Abnormal   Collection Time   02/01/11  5:16 AM      Component Value Range Comment   Prothrombin Time 15.8 (*) 11.6 - 15.2 (seconds)    INR 1.23  0.00 - 1.49    APTT     Status: Normal   Collection Time   02/01/11  5:16 AM      Component Value Range Comment   aPTT 31  24 - 37 (seconds)  Dg Chest 1 View  01/31/2011  *RADIOLOGY REPORT*  Clinical Data: Left hip fracture  CHEST - 1 VIEW  Comparison: 06/23/2009  Findings: Decreased lung volumes accentuate the heart size and vascular markings.  Negative for definite pneumonia, collapse, consolidation, effusion or pneumothorax.  No edema.  IMPRESSION: Low volume exam without acute chest process  Original Report Authenticated By: Judie Petit. Ruel Favors, M.D.   Dg Wrist Complete Left  01/31/2011  *RADIOLOGY REPORT*  Clinical Data: Fall, pain, femur fracture  LEFT WRIST - COMPLETE 3+ VIEW  Comparison: None.  Findings: Normal alignment.  Intact left distal radius and ulna. On the lateral view, there is a small ossified fragment along the carpal bones dorsally which could represent a small avulsion fracture from one of the carpal bones, possibly the  triquetrum. Recommend correlation for point tenderness in this region.  IMPRESSION: Possible small dorsal avulsion fracture of the carpal bones, suspect the triquetrum.  Normal alignment.  Original Report Authenticated By: Judie Petit. Ruel Favors, M.D.   Dg Hip Complete Left  01/31/2011  *RADIOLOGY REPORT*  Clinical Data: Fall, left hip pain  LEFT HIP - COMPLETE 2+ VIEW  Comparison: None.  Findings: There is acute minimally-displaced fracture of the proximal left femur shaft extending through the intertrochanteric region.  Left hip joint aligned.  Visualized pelvis and right hip unremarkable.  IMPRESSION: Acute displaced fracture proximal left femur  Original Report Authenticated By: Judie Petit. Ruel Favors, M.D.   Dg Knee Complete 4 Views Left  01/31/2011  *RADIOLOGY REPORT*  Clinical Data: Fall, knee pain  LEFT KNEE - COMPLETE 4+ VIEW  Comparison: None.  Findings: Normal alignment without fracture or effusion.  Preserved joint spaces.  IMPRESSION: No acute finding.  Original Report Authenticated By: Judie Petit. Ruel Favors, M.D.    Review of Systems  Constitutional: Negative.   HENT: Negative.   Eyes: Negative.   Respiratory: Negative.   Cardiovascular:       History of hypertension and elevated cholesterol.  Gastrointestinal: Negative.   Genitourinary: Negative.   Musculoskeletal: Positive for falls (yesterday with left hip pain.).  Skin: Negative.   Neurological: Negative.   Endo/Heme/Allergies:       History of thrombocytopenia.  He has seen Dr. Mariel Sleet.  Psychiatric/Behavioral: Negative.    Blood pressure 117/71, pulse 56, temperature 97.9 F (36.6 C), temperature source Oral, resp. rate 20, height 5' 7.5" (1.715 m), weight 127.96 kg (282 lb 1.6 oz), SpO2 96.00%. Physical Exam  Constitutional: He is oriented to person, place, and time. He appears well-developed and well-nourished.  HENT:  Head: Normocephalic and atraumatic.  Eyes: Conjunctivae and EOM are normal. Pupils are equal, round, and reactive  to light.  Neck: Normal range of motion. Neck supple.  Cardiovascular: Normal rate, regular rhythm, normal heart sounds and intact distal pulses.   Respiratory: Effort normal and breath sounds normal.  GI: Soft. Bowel sounds are normal.  Musculoskeletal: He exhibits tenderness (Pain left hip and left thigh with swelling ).       Left hip: He exhibits decreased range of motion, decreased strength, tenderness and swelling.       Legs: Neurological: He is alert and oriented to person, place, and time. He has normal reflexes.  Skin: Skin is warm and dry.  Psychiatric: He has a normal mood and affect. His behavior is normal. Judgment and thought content normal.    Assessment/Plan: Left hip intertrochanteric fracture and fracture of the lateral left proximal femur.  Plan:  To OR later today for fixation surgically. He  has been seen by hospitalist.  I have ordered two units of blood and vancomycin, as he is allergic to penicillin.  Ambert Virrueta 02/01/2011, 7:41 AM

## 2011-02-01 NOTE — Anesthesia Postprocedure Evaluation (Addendum)
Anesthesia Post Note  Patient: Bruce [name]  Procedure(s) Performed:  COMPRESSION HIP; OPEN REDUCTION INTERNAL FIXATION (ORIF) DISTAL FEMUR FRACTURE  Anesthesia type: Spinal  Patient location: PACU  Post pain: Pain level controlled  Post assessment: Post-op Vital signs reviewed, Patient's Cardiovascular Status Stable, Respiratory Function Stable, Patent Airway, No signs of Nausea or vomiting and Pain level controlled  Last Vitals:  Filed Vitals:   02/01/11 1458  BP: 154/65  Pulse: 58  Temp: 36.9 C  Resp: 20    Post vital signs: Reviewed and stable  Level of consciousness: awake and alert   Complications: No apparent anesthesia complications   02/02/11  Patient doing well, VSS, denies backache, headache and sensation returned to normal in lower extremities. No apparent anesthesia complications.

## 2011-02-01 NOTE — Progress Notes (Signed)
Chart reviewed.  Subjective: Only was having a lot of pain and muscle spasms. Currently comfortable. No nausea. Not hungry.  Objective: Vital signs in last 24 hours: Filed Vitals:   02/01/11 1600 02/01/11 1615 02/01/11 1630 02/01/11 1705  BP: 148/75 149/78 146/81 156/79  Pulse: 58 59 61 63  Temp:   98.4 F (36.9 C) 98.3 F (36.8 C)  TempSrc:      Resp: 17 17 18 20   Height:      Weight:      SpO2: 100% 100% 97% 98%   Weight change:   Intake/Output Summary (Last 24 hours) at 02/01/11 1748 Last data filed at 02/01/11 1600  Gross per 24 hour  Intake   2520 ml  Output   1150 ml  Net   1370 ml   Physical Exam: General: Asleep. Comfortable. Arousable and appropriate. Lungs clear to auscultation bilaterally without wheeze rhonchi or rales Cardiovascular regular rate rhythm without murmurs gallops rubs Abdomen obese soft nontender nondistended Extremities splint on the left arm and leg.  Lab Results: Basic Metabolic Panel:  Lab 02/01/11 9811 01/31/11 1731  NA 136 137  K 3.8 3.6  CL 104 104  CO2 26 26  GLUCOSE 150* 131*  BUN 15 17  CREATININE 0.97 1.07  CALCIUM 8.7 9.2  MG 1.8 --  PHOS -- --   Liver Function Tests:  Lab 01/31/11 1731  AST 17  ALT 15  ALKPHOS 81  BILITOT 0.6  PROT 6.7  ALBUMIN 3.7   No results found for this basename: LIPASE:2,AMYLASE:2 in the last 168 hours No results found for this basename: AMMONIA:2 in the last 168 hours CBC:  Lab 02/01/11 1505 02/01/11 0516 01/31/11 1731  WBC -- 8.7 11.8*  NEUTROABS -- -- 9.5*  HGB 11.9* 11.6* --  HCT 35.8* 33.9* --  MCV -- 91.6 90.7  PLT -- 119* 126*   Cardiac Enzymes: No results found for this basename: CKTOTAL:3,CKMB:3,CKMBINDEX:3,TROPONINI:3 in the last 168 hours BNP: No results found for this basename: PROBNP:3 in the last 168 hours D-Dimer: No results found for this basename: DDIMER:2 in the last 168 hours CBG: No results found for this basename: GLUCAP:6 in the last 168  hours Hemoglobin A1C: No results found for this basename: HGBA1C in the last 168 hours Fasting Lipid Panel: No results found for this basename: CHOL,HDL,LDLCALC,TRIG,CHOLHDL,LDLDIRECT in the last 914 hours Thyroid Function Tests:  Lab 02/01/11 0516  TSH 3.128  T4TOTAL --  FREET4 --  T3FREE --  THYROIDAB --   Coagulation:  Lab 02/01/11 0516  LABPROT 15.8*  INR 1.23    Micro Results: No results found for this or any previous visit (from the past 240 hour(s)). Studies/Results: Dg Chest 1 View  01/31/2011  *RADIOLOGY REPORT*  Clinical Data: Left hip fracture  CHEST - 1 VIEW  Comparison: 06/23/2009  Findings: Decreased lung volumes accentuate the heart size and vascular markings.  Negative for definite pneumonia, collapse, consolidation, effusion or pneumothorax.  No edema.  IMPRESSION: Low volume exam without acute chest process  Original Report Authenticated By: Judie Petit. Ruel Favors, M.D.   Dg Wrist Complete Left  01/31/2011  *RADIOLOGY REPORT*  Clinical Data: Fall, pain, femur fracture  LEFT WRIST - COMPLETE 3+ VIEW  Comparison: None.  Findings: Normal alignment.  Intact left distal radius and ulna. On the lateral view, there is a small ossified fragment along the carpal bones dorsally which could represent a small avulsion fracture from one of the carpal bones, possibly the triquetrum. Recommend correlation  for point tenderness in this region.  IMPRESSION: Possible small dorsal avulsion fracture of the carpal bones, suspect the triquetrum.  Normal alignment.  Original Report Authenticated By: Judie Petit. Ruel Favors, M.D.   Dg Hip Complete Left  01/31/2011  *RADIOLOGY REPORT*  Clinical Data: Fall, left hip pain  LEFT HIP - COMPLETE 2+ VIEW  Comparison: None.  Findings: There is acute minimally-displaced fracture of the proximal left femur shaft extending through the intertrochanteric region.  Left hip joint aligned.  Visualized pelvis and right hip unremarkable.  IMPRESSION: Acute displaced fracture  proximal left femur  Original Report Authenticated By: Judie Petit. Ruel Favors, M.D.   Dg Hip Operative Left  02/01/2011  *RADIOLOGY REPORT*  Clinical Data: Fracture fixation.  OPERATIVE LEFT HIP  Comparison: Plain films left hip 01/31/2011.  Findings: We are provided with 10 fluoroscopic spot views of the left hip.  Images demonstrate placement of a dynamic hip screw and two cerclage wires for fixation of a proximal femur fracture.  IMPRESSION: ORIF left femur fracture.  Original Report Authenticated By: Bernadene Bell. D'ALESSIO, M.D.   Dg Knee Complete 4 Views Left  01/31/2011  *RADIOLOGY REPORT*  Clinical Data: Fall, knee pain  LEFT KNEE - COMPLETE 4+ VIEW  Comparison: None.  Findings: Normal alignment without fracture or effusion.  Preserved joint spaces.  IMPRESSION: No acute finding.  Original Report Authenticated By: Judie Petit. Ruel Favors, M.D.   Scheduled Meds:   . sodium chloride   Intravenous STAT  . acetaminophen  1,000 mg Intravenous Q6H  . albuterol  2.5 mg Nebulization QID  . tenormin  25 mg Oral QHS  . budesonide-formoterol  2 puff Inhalation BID  . bupivacaine 0.75% in dextrose 8.25% (intrathecal)      . cyclobenzaprine  10 mg Oral Once  . enoxaparin (LOVENOX) injection  40 mg Subcutaneous Q24H  . ePHEDrine      . HYDROmorphone      .  HYDROmorphone (DILAUDID) injection  1 mg Intravenous Once  . HYDROmorphone PCA 0.3 mg/mL   Intravenous Q4H  . lidocaine      . omega-3 acid ethyl esters  1 g Oral QHS  . propofol      . propofol      . propofol      . simvastatin  20 mg Oral Daily  . vancomycin      . vancomycin  1,000 mg Intravenous Once  . vancomycin  1,000 mg Intravenous Once  . zolpidem  5 mg Oral QHS  . DISCONTD: fish oil-omega-3 fatty acids  1,000 mg Oral QHS  . DISCONTD: vancomycin  1,000 mg Other To OR   Continuous Infusions:   . 0.9 % NaCl with KCl 20 mEq / L 100 mL/hr at 02/01/11 0245  . DISCONTD: sodium chloride 125 mL/hr at 01/31/11 2137  . DISCONTD: sodium chloride  1,000 mL (01/31/11 1729)  . DISCONTD: lactated ringers 75 mL/hr at 02/01/11 1136   PRN Meds:.acetaminophen, acetaminophen, albuterol, bisacodyl, diphenhydrAMINE, diphenhydrAMINE, HYDROmorphone (DILAUDID) injection, magnesium hydroxide, naloxone, ondansetron (ZOFRAN) IV, ondansetron (ZOFRAN) IV, promethazine, sodium chloride, sodium phosphate, traZODone, DISCONTD: acetaminophen, DISCONTD: fentaNYL, DISCONTD: hydrogen peroxide, DISCONTD:  HYDROmorphone (DILAUDID) injection, DISCONTD: midazolam, DISCONTD: ondansetron (ZOFRAN) IV DISCONTD: ondansetron (ZOFRAN) IV, DISCONTD: sodium chloride irrigation Assessment/Plan: Active Problems:  Eosinophilia  COPD (chronic obstructive pulmonary disease)  Hip fracture, intertrochanteric  Wrist fracture  Thrombocytopenia  Doing well postop. Medical issues stable. Will and Robaxin as needed. Add Colace.   LOS: 1 day   Sigfredo Schreier L 02/01/2011, 5:48 PM

## 2011-02-01 NOTE — Transfer of Care (Signed)
Immediate Anesthesia Transfer of Care Note  Patient: Bruce [name]  Procedure(s) Performed:  COMPRESSION HIP; OPEN REDUCTION INTERNAL FIXATION (ORIF) DISTAL FEMUR FRACTURE  Patient Location: PACU  Anesthesia Type: SAB  Level of Consciousness: awake  Airway & Oxygen Therapy: Patient Spontanous Breathing and non-rebreather face mask  Post-op Assessment: Report given to PACU RN, Post -op Vital signs reviewed and stable. SAB Level  T 12  Post vital signs: Reviewed and stable  Complications: No apparent anesthesia complications

## 2011-02-01 NOTE — Anesthesia Preprocedure Evaluation (Addendum)
Anesthesia Evaluation  Patient identified by MRN, date of birth, ID band Patient awake    Reviewed: Allergy & Precautions, H&P , NPO status , Patient's Chart, lab work & pertinent test results  Airway Mallampati: II      Dental  (+) Edentulous Upper and Edentulous Lower   Pulmonary COPD COPD inhaler, former smoker clear to auscultation        Cardiovascular neg cardio ROS Regular Normal    Neuro/Psych PSYCHIATRIC DISORDERS Depression    GI/Hepatic   Endo/Other  Morbid obesity  Renal/GU      Musculoskeletal   Abdominal   Peds  Hematology   Anesthesia Other Findings   Reproductive/Obstetrics                           Anesthesia Physical Anesthesia Plan  ASA: III  Anesthesia Plan: Spinal   Post-op Pain Management:    Induction:   Airway Management Planned: Nasal Cannula  Additional Equipment:   Intra-op Plan:   Post-operative Plan:   Informed Consent: I have reviewed the patients History and Physical, chart, labs and discussed the procedure including the risks, benefits and alternatives for the proposed anesthesia with the patient or authorized representative who has indicated his/her understanding and acceptance.     Plan Discussed with:   Anesthesia Plan Comments:         Anesthesia Quick Evaluation

## 2011-02-02 ENCOUNTER — Encounter (HOSPITAL_COMMUNITY): Payer: Self-pay | Admitting: Internal Medicine

## 2011-02-02 DIAGNOSIS — D649 Anemia, unspecified: Secondary | ICD-10-CM

## 2011-02-02 HISTORY — DX: Anemia, unspecified: D64.9

## 2011-02-02 LAB — DIFFERENTIAL
Basophils Absolute: 0 10*3/uL (ref 0.0–0.1)
Basophils Relative: 0 % (ref 0–1)
Neutro Abs: 8.6 10*3/uL — ABNORMAL HIGH (ref 1.7–7.7)
Neutrophils Relative %: 72 % (ref 43–77)

## 2011-02-02 LAB — BASIC METABOLIC PANEL
BUN: 15 mg/dL (ref 6–23)
Calcium: 8.5 mg/dL (ref 8.4–10.5)
Creatinine, Ser: 1 mg/dL (ref 0.50–1.35)
GFR calc Af Amer: >90 mL/min (ref >90)
GFR calc non Af Amer: 78 mL/min — ABNORMAL LOW (ref >90)

## 2011-02-02 LAB — CBC
MCHC: 34.1 g/dL (ref 30.0–36.0)
RDW: 13 % (ref 11.5–15.5)

## 2011-02-02 MED ORDER — ALBUTEROL SULFATE (5 MG/ML) 0.5% IN NEBU
2.5000 mg | INHALATION_SOLUTION | Freq: Two times a day (BID) | RESPIRATORY_TRACT | Status: DC
  Administered 2011-02-02: 2.5 mg via RESPIRATORY_TRACT
  Filled 2011-02-02: qty 0.5

## 2011-02-02 MED ORDER — HYDROMORPHONE 0.3 MG/ML IV SOLN
INTRAVENOUS | Status: AC
  Filled 2011-02-02: qty 25

## 2011-02-02 MED ORDER — HYDROMORPHONE 0.3 MG/ML IV SOLN
INTRAVENOUS | Status: AC
  Administered 2011-02-02: 09:00:00
  Filled 2011-02-02: qty 25

## 2011-02-02 MED ORDER — SENNOSIDES-DOCUSATE SODIUM 8.6-50 MG PO TABS
1.0000 | ORAL_TABLET | Freq: Two times a day (BID) | ORAL | Status: DC
  Administered 2011-02-02 – 2011-02-07 (×11): 1 via ORAL
  Filled 2011-02-02 (×11): qty 1

## 2011-02-02 NOTE — Progress Notes (Signed)
CSW spoke with pt's wife and daughter as pt asleep.  CSW shared insurance deductible and copays with family as well as in network facilities, all which are in [place] or further away.  Family request for CSW not to fax out referral to SNF as they have no intention of pt going to [facility] and would take him home instead.  They also report concern regarding financial responsibility of SNF on top of hospital bill. [facility] is evaluating pt and family is agreeable to this assessment.  CSW will follow up tomorrow at their request after family speaks with financial counselor.    Karn Cassis

## 2011-02-02 NOTE — Progress Notes (Signed)
02/02/2011 Care team/MD: PT thinks that patient may be a candidate for inpatient rehab.  If you agree, please order an inpatient rehab consult.  Thanks, Rollene Rotunda. Semaj Coburn, PT, DPT

## 2011-02-02 NOTE — Progress Notes (Signed)
Subjective:  Currently no complaints of pain at rest. He is anticipating some pain with therapy.  Objective: Vital signs in last 24 hours: Filed Vitals:   02/02/11 0000 02/02/11 0542 02/02/11 0736 02/02/11 0753  BP:  115/67    Pulse:  74    Temp:  99 F (37.2 C)    TempSrc:  Oral    Resp: 20 20 19    Height:      Weight:      SpO2:  91% 98% 96%   Weight change:   Intake/Output Summary (Last 24 hours) at 02/02/11 0841 Last data filed at 02/02/11 0500  Gross per 24 hour  Intake   3180 ml  Output   1850 ml  Net   1330 ml   Physical Exam: General: No acute distress. Lungs clear to auscultation bilaterally without wheeze rhonchi or rales Cardiovascular regular rate rhythm without murmurs gallops rubs Abdomen obese soft nontender nondistended Extremities splint on the left arm. Large dressing covering the left hip. There is a Hemovac draining dark red blood. He has a trace of pedal edema. No pedal edema on the right lower extremity. Neurologic: He is alert and oriented x3.  Lab Results: Basic Metabolic Panel:  Lab 02/02/11 1610 02/01/11 0516  NA 135 136  K 4.4 3.8  CL 102 104  CO2 27 26  GLUCOSE 107* 150*  BUN 15 15  CREATININE 1.00 0.97  CALCIUM 8.5 8.7  MG -- 1.8  PHOS -- --   Liver Function Tests:  Lab 01/31/11 1731  AST 17  ALT 15  ALKPHOS 81  BILITOT 0.6  PROT 6.7  ALBUMIN 3.7   No results found for this basename: LIPASE:2,AMYLASE:2 in the last 168 hours No results found for this basename: AMMONIA:2 in the last 168 hours CBC:  Lab 02/02/11 0521 02/01/11 1505 02/01/11 0516 01/31/11 1731  WBC 11.8* -- 8.7 --  NEUTROABS 8.6* -- -- 9.5*  HGB 11.6* 11.9* -- --  HCT 34.0* 35.8* -- --  MCV 90.9 -- 91.6 --  PLT 113* -- 119* --   Cardiac Enzymes: No results found for this basename: CKTOTAL:3,CKMB:3,CKMBINDEX:3,TROPONINI:3 in the last 168 hours BNP: No results found for this basename: PROBNP:3 in the last 168 hours D-Dimer: No results found for  this basename: DDIMER:2 in the last 168 hours CBG: No results found for this basename: GLUCAP:6 in the last 168 hours Hemoglobin A1C: No results found for this basename: HGBA1C in the last 168 hours Fasting Lipid Panel: No results found for this basename: CHOL,HDL,LDLCALC,TRIG,CHOLHDL,LDLDIRECT in the last 960 hours Thyroid Function Tests:  Lab 02/01/11 0516  TSH 3.128  T4TOTAL --  FREET4 --  T3FREE --  THYROIDAB --   Coagulation:  Lab 02/01/11 0516  LABPROT 15.8*  INR 1.23    Micro Results: No results found for this or any previous visit (from the past 240 hour(s)). Studies/Results: Dg Chest 1 View  01/31/2011  *RADIOLOGY REPORT*  Clinical Data: Left hip fracture  CHEST - 1 VIEW  Comparison: 06/23/2009  Findings: Decreased lung volumes accentuate the heart size and vascular markings.  Negative for definite pneumonia, collapse, consolidation, effusion or pneumothorax.  No edema.  IMPRESSION: Low volume exam without acute chest process  Original Report Authenticated By: Judie Petit. Ruel Favors, M.D.   Dg Wrist Complete Left  01/31/2011  *RADIOLOGY REPORT*  Clinical Data: Fall, pain, femur fracture  LEFT WRIST - COMPLETE 3+ VIEW  Comparison: None.  Findings: Normal alignment.  Intact left distal radius and  ulna. On the lateral view, there is a small ossified fragment along the carpal bones dorsally which could represent a small avulsion fracture from one of the carpal bones, possibly the triquetrum. Recommend correlation for point tenderness in this region.  IMPRESSION: Possible small dorsal avulsion fracture of the carpal bones, suspect the triquetrum.  Normal alignment.  Original Report Authenticated By: Judie Petit. Ruel Favors, M.D.   Dg Hip Complete Left  01/31/2011  *RADIOLOGY REPORT*  Clinical Data: Fall, left hip pain  LEFT HIP - COMPLETE 2+ VIEW  Comparison: None.  Findings: There is acute minimally-displaced fracture of the proximal left femur shaft extending through the intertrochanteric  region.  Left hip joint aligned.  Visualized pelvis and right hip unremarkable.  IMPRESSION: Acute displaced fracture proximal left femur  Original Report Authenticated By: Judie Petit. Ruel Favors, M.D.   Dg Hip Operative Left  02/01/2011  *RADIOLOGY REPORT*  Clinical Data: Fracture fixation.  OPERATIVE LEFT HIP  Comparison: Plain films left hip 01/31/2011.  Findings: We are provided with 10 fluoroscopic spot views of the left hip.  Images demonstrate placement of a dynamic hip screw and two cerclage wires for fixation of a proximal femur fracture.  IMPRESSION: ORIF left femur fracture.  Original Report Authenticated By: Bernadene Bell. D'ALESSIO, M.D.   Dg Knee Complete 4 Views Left  01/31/2011  *RADIOLOGY REPORT*  Clinical Data: Fall, knee pain  LEFT KNEE - COMPLETE 4+ VIEW  Comparison: None.  Findings: Normal alignment without fracture or effusion.  Preserved joint spaces.  IMPRESSION: No acute finding.  Original Report Authenticated By: Judie Petit. Ruel Favors, M.D.   Scheduled Meds:    . acetaminophen  1,000 mg Intravenous Q6H  . albuterol  2.5 mg Nebulization QID  . tenormin  25 mg Oral QHS  . budesonide-formoterol  2 puff Inhalation BID  . bupivacaine 0.75% in dextrose 8.25% (intrathecal)      . enoxaparin (LOVENOX) injection  40 mg Subcutaneous Q24H  . ePHEDrine      . HYDROmorphone PCA 0.3 mg/mL   Intravenous Q4H  . HYDROmorphone PCA 0.3 mg/mL      . lidocaine      . omega-3 acid ethyl esters  1 g Oral QHS  . propofol      . propofol      . propofol      . senna-docusate  1 tablet Oral BID  . simvastatin  20 mg Oral Daily  . vancomycin      . vancomycin  1,000 mg Intravenous Once  . vancomycin  1,000 mg Intravenous Once  . zolpidem  5 mg Oral QHS  . DISCONTD: docusate sodium  100 mg Oral BID   Continuous Infusions:    . 0.9 % NaCl with KCl 20 mEq / L 100 mL/hr at 02/02/11 0816  . DISCONTD: lactated ringers 75 mL/hr at 02/01/11 1136   PRN Meds:.acetaminophen, acetaminophen, albuterol,  bisacodyl, diphenhydrAMINE, diphenhydrAMINE, HYDROmorphone (DILAUDID) injection, magnesium hydroxide, methocarbamol, naloxone, ondansetron (ZOFRAN) IV, ondansetron (ZOFRAN) IV, promethazine, sodium chloride, sodium phosphate, traZODone, DISCONTD: acetaminophen, DISCONTD: fentaNYL, DISCONTD: hydrogen peroxide, DISCONTD: midazolam, DISCONTD: ondansetron (ZOFRAN) IV, DISCONTD: sodium chloride irrigation Assessment/Plan: Active Problems:  Eosinophilia  COPD (chronic obstructive pulmonary disease)  Hip fracture, intertrochanteric  Wrist fracture  Thrombocytopenia  Anemia  He is progressing. He is followed by Dr. Jodene Nam for chronic thrombocytopenia. He has blood loss anemia and was transfused one unit of packed red blood cells postoperatively. He is postoperative day #1. Physical therapy is pending. Continue current management. Continue to follow  his CBC.   LOS: 2 days   Gypsy Kellogg 02/02/2011, 8:41 AM

## 2011-02-02 NOTE — Progress Notes (Signed)
In to evaluate patient for appropriateness for Inpatient Rehab at [facility]. Patient and wife very concerned regarding financial responsibility. Discussed at length deductibles, out of pocket expenses with daughter and wife, as pt was sleeping on and off during conversation. Wife is agreeable to Inpatient Rehab starting process with [employer] for approval. Made them aware that [employer] would have to approve before making final decision. Will f/u in am regarding progress with therapies on POD#2 and fax clinicals to insurance. Will need OT as well as PT prior to insurance making decision. Thanks for consult. Toni Amend [phone]

## 2011-02-02 NOTE — Progress Notes (Signed)
Patient refused to finish taking his 2000 treatment and asked if he had to continue with them. I changed the so that he only receives PRN treatments and his Symbicort; which he takes at home.

## 2011-02-02 NOTE — Progress Notes (Signed)
UR Chart Review Completed  

## 2011-02-02 NOTE — Op Note (Signed)
NAME:  Bruce [name], Bruce [name] NO.:  [id number]  MEDICAL RECORD NO.:  [id number]  LOCATION:  A328                          FACILITY:  [facility]  PHYSICIAN:  J. Darreld Mclean, M.D. DATE OF BIRTH:  1945/05/20  DATE OF PROCEDURE:  02/01/2011 DATE OF DISCHARGE:                              OPERATIVE REPORT   PREOPERATIVE DIAGNOSIS:  Intertrochanteric fracture of the left hip with subtrochanteric extension and also fracture of the lateral femoral shaft, proximal to midshaft.  PROCEDURE:  Open reduction and internal fixation of left hip intertrochanteric fracture, subtrochanteric extension and also open treatment and internal reduction of the left proximal shaft fracture of the femur.  We used a 8-hole sideplate compression screw system with a 95 compression screw and 2 cerclage wires.  Bone putty was also used.  ANESTHESIA:  Spinal.  ESTIMATED BLOOD LOSS:  300 mL.  BLOOD GIVEN:  1 unit of blood in PACU.  DRAINS:  One large Hemovac drain.  Foley was inserted prior to the procedure.  SURGEON:  J. Darreld Mclean, MD  INDICATIONS:  The patient fell yesterday sustaining the above-mentioned injury.  He was seen and evaluated by the emergency room and admitted by the hospitalist to the hospital.  He is at increased risk secondary to other medical conditions and the type of fracture, but it is acceptable. I have gone over with the family of the patient, his wife and his daughter concerning the risks and imponderables of the procedure.  They appeared to understand the procedure as outlined.  They asked appropriate questions.  DESCRIPTION OF PROCEDURE:  The patient was seen in the holding area, identified the left hip as the correct surgical site, placed a mark on the left.  He was brought to the OR and given spinal anesthesia.  He was transferred to the OR fracture table.  While on the fracture table, we had a radiology time-out.  Everyone had aprons on and lead shields  and had their appropriate badges.  X-rays showed the fracture with proper positioning on the table.  The patient was prepped and draped in the usual manner.  We had a generalized time-out identifying the patient as Bruce [name], doing his left hip.  All instrumentation had been properly positioned and ready, and the OR team knew each other.  Approach was made to the hip through the skin, subcutaneous tissue, tensor fascia lata, vastus lateralis, and fracture site was identified. Guide pin was placed at 135 degrees.  I elected to get a sideplate long enough to extend approximately 3 screw holes below the fracture site and an 8-hole sideplate fit this criteria very nicely.  I had a 10-hole plate in case I needed it.  A 95-degree compression screw was inserted including the 135-degree angle and 8-hole sideplate.  Compression was applied to the system and the fractures were reduced.  Screws measured from 50 mm to 42 mm.  X-rays were taken with the C-arm fluoroscopy unit throughout the procedure.  I elected also at this time to put 2 cerclage wires on proximal and one mid shaft to give extra fixation.  These were placed and cramped with tension.  I put some bone putty also around  the fracture site.  It was a rather extensive fracture of the proximal femur.  X-ray alignment looked good clinically.  Hemovac drain was placed and sewn with 2-0 silk and the wound was reapproximated first with a running #1 Surgilon suture for the vastus lateralis and then the fascial layer reapproximated using #1 Surgilon interrupted figure-of- eight.  Subcutaneous tissue closed in layers with 2-0 plain and skin reapproximated with skin staples.  Hemovac drain was sewn in with 2-0 silk.  Sterile dressing applied.  The patient tolerated the procedure well and will be admitted back to the floor.  Condition was good.  I will give him a unit of blood in recovery per recommendation of the anesthesiologist.           ______________________________ Shela Commons. Darreld Mclean, M.D.     JWK/MEDQ  D:  02/01/2011  T:  02/02/2011  Job:  [id number]

## 2011-02-02 NOTE — Progress Notes (Signed)
Physical Therapy Evaluation Patient Details Name: Bruce [name] MRN: [id number] DOB: March 19, 1945 Today's Date: 02/02/2011 Time: 8:00-8:45, 13:00-13:45 Charges: I EV, 10 TE  Problem List:  Patient Active Problem List  Diagnoses  . GOUT, UNSPECIFIED  . Eosinophilia  . ALLERGIC RHINITIS, SEASONAL  . RECTAL BLEEDING  . ARTHRITIS  . COPD (chronic obstructive pulmonary disease)  . Hip fracture, intertrochanteric  . Wrist fracture  . Thrombocytopenia  . Anemia    Past Medical History:  Past Medical History  Diagnosis Date  . Eosinophilia   . Thrombocytopenia   . COPD (chronic obstructive pulmonary disease)   . Obesity   . Degenerative joint disease   . Depression   . Gout   . Anemia 02/02/2011  . Hyperlipidemia   . Hip fracture, left January 2013.    Status post ORIF   Past Surgical History:  Past Surgical History  Procedure Date  . Multiple tooth extractions   . Left hip orif January 2013.    Dr. Hilda Lias.    PT Assessment/Plan/Recommendation PT Assessment Clinical Impression Statement: 66 y.o. male admitted to [facility] s/p fall from step ladder with fracutred left femur and left wrist s/p ORIF to left femur and is now TDWB left leg.  He presents with increased left leg pain, decreased left leg ROM, decreased mobility and independence.  He would benefit from skilled PT to maximize his functional mobility, independence and safety before returning home with his wife's assistance.   PT Recommendation/Assessment: Patient will need skilled PT in the acute care venue PT Problem List: Decreased strength;Decreased range of motion;Decreased activity tolerance;Decreased balance;Decreased mobility;Decreased knowledge of use of DME;Decreased knowledge of precautions;Pain PT Therapy Diagnosis : Difficulty walking;Abnormality of gait;Generalized weakness;Acute pain PT Plan PT Frequency: Min 6X/week PT Treatment/Interventions: DME instruction;Gait training;Stair training;Functional mobility  training;Therapeutic activities;Therapeutic exercise;Balance training;Neuromuscular re-education;Patient/family education PT Recommendation Recommendations for Other Services: OT consult Follow Up Recommendations: Inpatient Rehab Equipment Recommended: Rolling walker with 5" wheels;3 in 1 bedside comode;Wheelchair (measurements) PT Goals  Acute Rehab PT Goals PT Goal Formulation: With patient Time For Goal Achievement: 2 weeks Pt will Roll Supine to Right Side: with min assist;with rail PT Goal: Rolling Supine to Right Side - Progress: Goal set today Pt will Roll Supine to Left Side: with mod assist;with rail PT Goal: Rolling Supine to Left Side - Progress: Goal set today Pt will go Supine/Side to Sit: with min assist;with rail PT Goal: Supine/Side to Sit - Progress: Goal set today Pt will go Sit to Supine/Side: with min assist;with HOB 0 degrees;with rail PT Goal: Sit to Supine/Side - Progress: Goal set today Pt will go Sit to Stand: with mod assist;from elevated surface;with upper extremity assist PT Goal: Sit to Stand - Progress: Goal set today Pt will go Stand to Sit: with mod assist;to elevated surface;with upper extremity assist PT Goal: Stand to Sit - Progress: Goal set today Pt will Transfer Bed to Chair/Chair to Bed: with mod assist PT Transfer Goal: Bed to Chair/Chair to Bed - Progress: Goal set today  PT Evaluation Precautions/Restrictions  Restrictions Weight Bearing Restrictions: Yes LUE Weight Bearing:  (not specified, but likely NWB L wrist.  ) LLE Weight Bearing: Touchdown weight bearing Prior Functioning  Home Living Lives With: Spouse Receives Help From: Family Type of Home: House Home Layout: One level (one step in hallway to get to the bedroom) Home Access: Stairs to enter Entrance Stairs-Rails: None Entrance Stairs-Number of Steps: 1 (in carport) Home Adaptive Equipment: Straight cane Prior Function Level of  Independence: Independent with basic  ADLs;Independent with homemaking with ambulation;Independent with gait;Independent with transfers Able to Take Stairs?: Yes Driving: Yes Cognition Cognition Orientation Level: Oriented X4 Extremity Assessment RLE Assessment RLE Assessment: Within Functional Limits LLE Assessment LLE Assessment: Exceptions to [facility] LLE PROM (degrees) Overall PROM Left Lower Extremity: Deficits LLE Overall PROM Comments: decreased ROM throughout left leg secondary to pain, decreased ankle DF, knee flexion and all hip motions.   LLE Strength LLE Overall Strength: Deficits LLE Overall Strength Comments: ankle 3-/5, knee 2-/5, hip 2-/5 Mobility (including Balance) Bed Mobility Bed Mobility: Yes Supine to Sit: 2: Max assist;With rails;HOB elevated (Comment degrees) (HOB 45 degrees) Supine to Sit Details (indicate cue type and reason): max assist of trunk and left leg.  Max verbal cues for technique and hand placement.   Sitting - Scoot to Edge of Bed: 3: Mod assist;With rail Sitting - Scoot to [place] of Bed Details (indicate cue type and reason): mod assist of hips and left leg to get to EOB, unable to let left foot down towards floor secondary to pain.   Sit to Supine: 3: Mod assist Sit to Supine - Details (indicate cue type and reason): mod assist of left leg and hips paitient able to control trunk with trapeeze bar.   Scooting to [facility]: 6: Modified independent (Device/Increase time);With trapeze (with bed in trendelenberg) Transfers Transfers: No (patient unable due to pain)    Exercise  Total Joint Exercises Ankle Circles/Pumps: AAROM;Left;AROM;Right;10 reps Quad Sets: AROM;Both;10 reps Heel Slides: AROM;AAROM;Both;10 reps Hip ABduction/ADduction: AROM;AAROM;Both;10 reps Straight Leg Raises: AROM;Right;10 reps Bridges: AROM;Right;10 reps (1/2 bridge with trapeeze bar pull.  ) End of Session PT - End of Session Activity Tolerance: Patient limited by pain Patient left: in bed;with call bell in  reach;with family/visitor present (wife in room) Nurse Communication:  (spoke with Child psychotherapist re: d/c) General Behavior During Session: [facility] for tasks performed Cognition: [facility] for tasks performed  Nichelle Renwick B. Aundrea Horace, PT, DPT  02/02/2011, 8:26 PM

## 2011-02-02 NOTE — Progress Notes (Signed)
Subjective: 1 Day Post-Op Procedure(s) (LRB): COMPRESSION HIP (Left) OPEN REDUCTION INTERNAL FIXATION (ORIF) DISTAL FEMUR FRACTURE (Left) Patient reports pain as 5 on 0-10 scale.    Objective: Vital signs in last 24 hours: Temp:  [98.3 F (36.8 C)-99 F (37.2 C)] 99 F (37.2 C) (01/17 0542) Pulse Rate:  [57-74] 74  (01/17 0542) Resp:  [12-21] 20  (01/17 0915) BP: (113-160)/(57-81) 115/67 mmHg (01/17 0542) SpO2:  [87 %-100 %] 94 % (01/17 0915) FiO2 (%):  [98 %] 98 % (01/17 0736)  Intake/Output from previous day: 01/16 0701 - 01/17 0700 In: 3180 [P.O.:780; I.V.:2050; Blood:350] Out: 1850 [Urine:1550; Blood:300] Intake/Output this shift:     Basename 02/02/11 0521 02/01/11 1505 02/01/11 0516 01/31/11 1731  HGB 11.6* 11.9* 11.6* 13.2    Basename 02/02/11 0521 02/01/11 1505 02/01/11 0516  WBC 11.8* -- 8.7  RBC 3.74* -- 3.70*  HCT 34.0* 35.8* --  PLT 113* -- 119*    Basename 02/02/11 0521 02/01/11 0516  NA 135 136  K 4.4 3.8  CL 102 104  CO2 27 26  BUN 15 15  CREATININE 1.00 0.97  GLUCOSE 107* 150*  CALCIUM 8.5 8.7    Basename 02/01/11 0516  LABPT --  INR 1.23    Neurologically intact Neurovascular intact Sensation intact distally Intact pulses distally Dorsiflexion/Plantar flexion intact Compartment soft  He complains of some itching.  To try Benadryl first.  May have to change pain medication.  Left wrist is not painful.  PT has arranged platform walker and will get him up soon.  Assessment/Plan: 1 Day Post-Op Procedure(s) (LRB): COMPRESSION HIP (Left) OPEN REDUCTION INTERNAL FIXATION (ORIF) DISTAL FEMUR FRACTURE (Left) Up with therapy  Haldon Carley 02/02/2011, 10:15 AM

## 2011-02-02 NOTE — Addendum Note (Signed)
Addendum  created 02/02/11 1250 by Glynn Octave, CRNA   Modules edited:Notes Section

## 2011-02-03 DIAGNOSIS — E871 Hypo-osmolality and hyponatremia: Secondary | ICD-10-CM | POA: Diagnosis not present

## 2011-02-03 LAB — URINE MICROSCOPIC-ADD ON

## 2011-02-03 LAB — URINALYSIS, ROUTINE W REFLEX MICROSCOPIC
Bilirubin Urine: NEGATIVE
Leukocytes, UA: NEGATIVE
Nitrite: NEGATIVE
Specific Gravity, Urine: 1.015 (ref 1.005–1.030)
Urobilinogen, UA: 1 mg/dL (ref 0.0–1.0)
pH: 6 (ref 5.0–8.0)

## 2011-02-03 LAB — DIFFERENTIAL
Basophils Absolute: 0 10*3/uL (ref 0.0–0.1)
Lymphocytes Relative: 10 % — ABNORMAL LOW (ref 12–46)
Monocytes Absolute: 0.9 10*3/uL (ref 0.1–1.0)
Neutro Abs: 7.2 10*3/uL (ref 1.7–7.7)
Neutrophils Relative %: 76 % (ref 43–77)

## 2011-02-03 LAB — BASIC METABOLIC PANEL
Chloride: 99 mEq/L (ref 96–112)
Creatinine, Ser: 0.94 mg/dL (ref 0.50–1.35)
GFR calc Af Amer: >90 mL/min (ref >90)
GFR calc non Af Amer: 87 mL/min — ABNORMAL LOW (ref >90)
Potassium: 3.9 mEq/L (ref 3.5–5.1)

## 2011-02-03 LAB — CBC
HCT: 27.6 % — ABNORMAL LOW (ref 39.0–52.0)
RDW: 12.9 % (ref 11.5–15.5)
WBC: 9.5 10*3/uL (ref 4.0–10.5)

## 2011-02-03 MED ORDER — LORAZEPAM 2 MG/ML IJ SOLN
INTRAMUSCULAR | Status: AC
  Administered 2011-02-03: 0.5 mg via INTRAVENOUS
  Filled 2011-02-03: qty 1

## 2011-02-03 MED ORDER — LORAZEPAM 2 MG/ML IJ SOLN
0.5000 mg | Freq: Once | INTRAMUSCULAR | Status: AC
  Administered 2011-02-03: 0.5 mg via INTRAVENOUS

## 2011-02-03 MED ORDER — LORAZEPAM 2 MG/ML IJ SOLN
0.5000 mg | Freq: Once | INTRAMUSCULAR | Status: AC | PRN
  Administered 2011-02-03: 0.5 mg via INTRAVENOUS
  Filled 2011-02-03: qty 1

## 2011-02-03 MED ORDER — CITALOPRAM HYDROBROMIDE 20 MG PO TABS
20.0000 mg | ORAL_TABLET | Freq: Every day | ORAL | Status: DC
  Administered 2011-02-03 – 2011-02-06 (×4): 20 mg via ORAL
  Filled 2011-02-03 (×4): qty 1

## 2011-02-03 NOTE — Progress Notes (Signed)
Subjective: 2 Days Post-Op Procedure(s) (LRB): COMPRESSION HIP (Left) OPEN REDUCTION INTERNAL FIXATION (ORIF) DISTAL FEMUR FRACTURE (Left) Patient reports pain as 5 on 0-10 scale.    Objective: Vital signs in last 24 hours: Temp:  [98.2 F (36.8 C)-99.2 F (37.3 C)] 98.7 F (37.1 C) (01/18 0614) Pulse Rate:  [72-76] 73  (01/18 0614) Resp:  [18-22] 22  (01/18 0614) BP: (111-145)/(64-73) 111/64 mmHg (01/18 0614) SpO2:  [86 %-98 %] 93 % (01/18 0732) FiO2 (%):  [96 %-99 %] 99 % (01/17 1559)  Intake/Output from previous day: 01/17 0701 - 01/18 0700 In: 5353 [P.O.:380; I.V.:4573; IV Piggyback:400] Out: 1450 [Urine:1450] Intake/Output this shift:     Basename 02/03/11 0519 02/02/11 0521 02/01/11 1505 02/01/11 0516 01/31/11 1731  HGB 9.6* 11.6* 11.9* 11.6* 13.2    Basename 02/03/11 0519 02/02/11 0521  WBC 9.5 11.8*  RBC 3.07* 3.74*  HCT 27.6* 34.0*  PLT 99* 113*    Basename 02/03/11 0519 02/02/11 0521  NA 132* 135  K 3.9 4.4  CL 99 102  CO2 26 27  BUN 11 15  CREATININE 0.94 1.00  GLUCOSE 126* 107*  CALCIUM 8.5 8.5    Basename 02/01/11 0516  LABPT --  INR 1.23    Neurologically intact Neurovascular intact Sensation intact distally Intact pulses distally Dorsiflexion/Plantar flexion intact Incision: dressing C/D/I and no drainage No cellulitis present Compartment soft  Hemovac drain removed.  Foley had been removed earlier. To watch HGB.  Down to 9.6. To try to arrange transfer to [facility] when pain decreased and moving a little better.   Assessment/Plan: 2 Days Post-Op Procedure(s) (LRB): COMPRESSION HIP (Left) OPEN REDUCTION INTERNAL FIXATION (ORIF) DISTAL FEMUR FRACTURE (Left) Up with therapy  Charlee Squibb 02/03/2011, 10:36 AM

## 2011-02-03 NOTE — Progress Notes (Signed)
Subjective: He is starting to work with the therapist. He has no complaints at the moment.  Objective: Vital signs in last 24 hours: Filed Vitals:   02/03/11 0215 02/03/11 0400 02/03/11 0614 02/03/11 0732  BP: 145/73  111/64   Pulse: 76  73   Temp: 99.2 F (37.3 C)  98.7 F (37.1 C)   TempSrc: Oral  Oral   Resp: 22 20 22    Height:      Weight:      SpO2: 94% 95% 93% 93%   Weight change:   Intake/Output Summary (Last 24 hours) at 02/03/11 1347 Last data filed at 02/03/11 0800  Gross per 24 hour  Intake 1761.33 ml  Output   1450 ml  Net 311.33 ml   Physical Exam: General: No acute distress. Lungs clear to auscultation bilaterally without wheeze rhonchi or rales Cardiovascular regular rate rhythm without murmurs gallops rubs Abdomen obese soft nontender nondistended Extremities splint on the left arm. Large dressing covering the left hip. The Hemovac has been removed. He has a trace of pedal edema. No pedal edema on the right lower extremity. Neurologic: He is alert and oriented x3.  Lab Results: Basic Metabolic Panel:  Lab 02/03/11 1610 02/02/11 0521 02/01/11 0516  NA 132* 135 --  K 3.9 4.4 --  CL 99 102 --  CO2 26 27 --  GLUCOSE 126* 107* --  BUN 11 15 --  CREATININE 0.94 1.00 --  CALCIUM 8.5 8.5 --  MG -- -- 1.8  PHOS -- -- --   Liver Function Tests:  Lab 01/31/11 1731  AST 17  ALT 15  ALKPHOS 81  BILITOT 0.6  PROT 6.7  ALBUMIN 3.7   No results found for this basename: LIPASE:2,AMYLASE:2 in the last 168 hours No results found for this basename: AMMONIA:2 in the last 168 hours CBC:  Lab 02/03/11 0519 02/02/11 0521  WBC 9.5 11.8*  NEUTROABS 7.2 8.6*  HGB 9.6* 11.6*  HCT 27.6* 34.0*  MCV 89.9 90.9  PLT 99* 113*   Cardiac Enzymes: No results found for this basename: CKTOTAL:3,CKMB:3,CKMBINDEX:3,TROPONINI:3 in the last 168 hours BNP: No results found for this basename: PROBNP:3 in the last 168 hours D-Dimer: No results found for this  basename: DDIMER:2 in the last 168 hours CBG: No results found for this basename: GLUCAP:6 in the last 168 hours Hemoglobin A1C: No results found for this basename: HGBA1C in the last 168 hours Fasting Lipid Panel: No results found for this basename: CHOL,HDL,LDLCALC,TRIG,CHOLHDL,LDLDIRECT in the last 960 hours Thyroid Function Tests:  Lab 02/01/11 0516  TSH 3.128  T4TOTAL --  FREET4 --  T3FREE --  THYROIDAB --   Coagulation:  Lab 02/01/11 0516  LABPROT 15.8*  INR 1.23    Micro Results: No results found for this or any previous visit (from the past 240 hour(s)). Studies/Results: Dg Hip Operative Left  02/01/2011  *RADIOLOGY REPORT*  Clinical Data: Fracture fixation.  OPERATIVE LEFT HIP  Comparison: Plain films left hip 01/31/2011.  Findings: We are provided with 10 fluoroscopic spot views of the left hip.  Images demonstrate placement of a dynamic hip screw and two cerclage wires for fixation of a proximal femur fracture.  IMPRESSION: ORIF left femur fracture.  Original Report Authenticated By: Bernadene Bell. D'ALESSIO, M.D.   Scheduled Meds:    . tenormin  25 mg Oral QHS  . budesonide-formoterol  2 puff Inhalation BID  . enoxaparin (LOVENOX) injection  40 mg Subcutaneous Q24H  . HYDROmorphone PCA 0.3 mg/mL  Intravenous Q4H  . omega-3 acid ethyl esters  1 g Oral QHS  . senna-docusate  1 tablet Oral BID  . simvastatin  20 mg Oral Daily  . vancomycin  1,000 mg Intravenous Once  . zolpidem  5 mg Oral QHS  . DISCONTD: albuterol  2.5 mg Nebulization QID  . DISCONTD: albuterol  2.5 mg Nebulization BID   Continuous Infusions:    . 0.9 % NaCl with KCl 20 mEq / L 70 mL/hr at 02/02/11 2315   PRN Meds:.acetaminophen, acetaminophen, albuterol, bisacodyl, diphenhydrAMINE, diphenhydrAMINE, HYDROmorphone (DILAUDID) injection, magnesium hydroxide, methocarbamol, naloxone, ondansetron (ZOFRAN) IV, promethazine, sodium chloride, traZODone Assessment/Plan: Active Problems:   Eosinophilia  COPD (chronic obstructive pulmonary disease)  Hip fracture, intertrochanteric  Wrist fracture  Thrombocytopenia  Anemia  Hyponatremia  He is progressing. He is followed by Dr. Jodene Nam for chronic thrombocytopenia. He has blood loss anemia and was transfused one unit of packed red blood cells postoperatively. His hemoglobin has decreased 2 g since yesterday. His serum sodium has decreased a little. Will continue gentle IV fluids. His COPD is stable. He is postoperative day #2.  Continue current management. Continue to follow his CBC.   LOS: 3 days   Bruce [name] 02/03/2011, 1:47 PM

## 2011-02-03 NOTE — Progress Notes (Signed)
Occupational Therapy Evaluation Patient Details Name: Bruce [name] MRN: [id number] DOB: 04-Jul-1945 Today's Date: 02/03/2011 1320-1350  Evaluation 4098-1191 15' Therapeutic Activity 1335-1350 15' Problem List:  Patient Active Problem List  Diagnoses  . GOUT, UNSPECIFIED  . Eosinophilia  . ALLERGIC RHINITIS, SEASONAL  . RECTAL BLEEDING  . ARTHRITIS  . COPD (chronic obstructive pulmonary disease)  . Hip fracture, intertrochanteric  . Wrist fracture  . Thrombocytopenia  . Anemia  . Hyponatremia    Past Medical History:  Past Medical History  Diagnosis Date  . Eosinophilia   . Thrombocytopenia   . COPD (chronic obstructive pulmonary disease)   . Obesity   . Degenerative joint disease   . Depression   . Gout   . Anemia 02/02/2011  . Hyperlipidemia   . Hip fracture, left January 2013.    Status post ORIF   Past Surgical History:  Past Surgical History  Procedure Date  . Multiple tooth extractions   . Left hip orif January 2013.    Dr. Hilda Lias.    OT Assessment/Plan/Recommendation OT Assessment Clinical Impression Statement: Skilled OT required to increase ADL's, mantain LUE A/PROM at shoulder; increase mobility, transfers, and activity tolerance OT Recommendation/Assessment: Patient will need skilled OT in the acute care venue OT Problem List: Decreased activity tolerance;Impaired balance (sitting and/or standing);Decreased cognition;Decreased safety awareness;Decreased knowledge of use of DME or AE;Decreased knowledge of precautions;Obesity;Pain;Impaired UE functional use Barriers to Discharge: None OT Therapy Diagnosis : Generalized weakness;Cognitive deficits;Acute pain;Other (comment) (decreased self awareness and motor planning) OT Plan OT Frequency: Min 2X/week OT Treatment/Interventions: Self-care/ADL training;Therapeutic exercise;Therapeutic activities;Manual therapy;Cognitive remediation/compensation;Patient/family education OT Recommendation Follow Up  Recommendations: Skilled nursing facility (Due to decreased activity tolerance) Individuals Consulted Consulted and Agree with Results and Recommendations: Family member/caregiver;Patient Family Member Consulted: Wife, son and Daughter. OT Goals Acute Rehab OT Goals OT Goal Formulation: With patient/family ADL Goals Pt Will Perform Grooming: with set-up;Sitting, chair;with supervision ADL Goal: Grooming - Progress: Goal set today Arm Goals Pt Will Tolerate PROM: with caregiver independent in performing;to maintain range of motion;Left upper extremity;15 reps Arm Goal: PROM - Progress: Goal set today Additional Arm Goal #2: Patient will demonstrate ability to keep from using LUE or placing wt through LUE wrist.  OT Evaluation Precautions/Restrictions  Precautions Precautions: Fall Required Braces or Orthoses: Yes Other Brace/Splint: half cast applied by surgical team to left wrist elbow (sugar tong MP to elbow) Restrictions Weight Bearing Restrictions: Yes LUE Weight Bearing:  (no weight through LUE wrist likely) LLE Weight Bearing: Touchdown weight bearing Prior Functioning Home Living Lives With: Spouse Receives Help From: Family Type of Home: House Home Layout: One level Home Access: Stairs to enter Entrance Stairs-Rails: None Bathroom Shower/Tub: Engineer, manufacturing systems: Standard Bathroom Accessibility: Yes Home Adaptive Equipment: Grab bars in shower;Hand-held shower hose;Shower chair with back;Straight cane Prior Function Level of Independence: Independent with basic ADLs;Independent with homemaking with ambulation;Independent with transfers Driving: Yes Vocation: Retired ADL ADL Eating/Feeding: Simulated Where Assessed - Eating/Feeding: Edge of bed Grooming: Performed;Set up;Minimal assistance Grooming Details (indicate cue type and reason): Decreased ability to sit upright independently at first needing Min Assist Where Assessed - Grooming: Sitting,  bed;Other (comment) (supported at first moving to SBA) Upper Body Bathing: Not assessed Lower Body Bathing: Not assessed Upper Body Dressing: Not assessed Lower Body Dressing: Not assessed Toilet Transfer: Not assessed Toilet Transfer Method: Other (comment) (Patient resistant to being transfered even to bedside chair) Toileting - Hygiene: Not assessed Tub/Shower Transfer: Not assessed Vision/Perception  Vision - History  Baseline Vision: No visual deficits Patient Visual Report: No change from baseline Vision - Assessment Eye Alignment: Within Functional Limits Vision Assessment: Vision not tested Perception Perception: Within Functional Limits Praxis Praxis: Impaired Praxis Impairment Details: Ideation;Ideomotor;Motor planning Praxis-Other Comments: Patient had difficulty with concept of not using LUE, Unable to motor plan getting self to EOB, Difficulty following verbal instructions. Cognition Cognition Arousal/Alertness: Other (comment) (lethargic @ first due to taking off O2 (3L)) Overall Cognitive Status: Impaired Attention: Impaired Orientation Level: Oriented X4 Following Commands: Follows one step commands with increased time;Follows one step commands inconsistently Safety/Judgement: Decreased awareness of safety precautions;Decreased safety judgement for tasks assessed Decreased Safety/Judgement: Other (comment) (Pt.  feels he should just lay in bed.) Awareness of Errors: Decreased awareness of errors made Decreased Awareness of Errors: Assistance required to correct errors made Awareness of Errors - Other Comments: Hands on assist required for hand placement and vc's for motor planning. Awareness of Deficits: Decreased awareness of deficits Problem Solving: Requires assistance for problem solving Sensation/Coordination Sensation Light Touch: Appears Intact Stereognosis: Not tested Hot/Cold: Not tested Proprioception: Not tested Coordination Gross Motor Movements  are Fluid and Coordinated: No Fine Motor Movements are Fluid and Coordinated: No Coordination and Movement Description: Left hand casted.  Extremity Assessment RUE Assessment RUE Assessment: Within Functional Limits LUE Assessment LUE Assessment: Exceptions to WFL LUE PROM (degrees) Overall PROM Left Upper Extremity: Within functional limits for tasks assessed (shoulder only WFL; Hand to elbow casted) Mobility  Bed Mobility Bed Mobility: Yes Rolling Right: 3: Mod assist Rolling Right Details (indicate cue type and reason): Mod Asssist of 2; max verbal cues assist and hand placement, assist minimally at LLE leg. Rolling Left: 2: Max assist Rolling Left Details (indicate cue type and reason): Max assist of trunk and left leg.  Max vc-ing for technique and hand placement Right Sidelying to Sit: 3: Mod assist Right Sidelying to Sit Details (indicate cue type and reason): Mod of 2 Supine to Sit: 3: Mod assist Supine to Sit Details (indicate cue type and reason): of 2 person Sitting - Scoot to Edge of Bed: 3: Mod assist Sit to Supine: 2: Max assist Sit to Supine - Details (indicate cue type and reason): Patient began to throw himself back onto bed and both therapist needed to safely swing legs and trunk onto bed. Scooting to [facility]: 3: Mod assist Scooting to [facility] Details (indicate cue type and reason): 2 person and verbal cues and hands on assist Transfers Transfers: No Exercises General Exercises - Upper Extremity Shoulder Flexion: PROM;Left Shoulder ABduction: PROM;Left General Exercises - Lower Extremity Ankle Circles/Pumps: AROM;Both Quad Sets: AROM;Right;AAROM;Left;10 reps;Supine Short Arc Quad: AROM;Right;AAROM;Left;10 reps;Supine Heel Slides: AROM;Right;AAROM;Left;10 reps;Supine Shoulder Exercises Shoulder Flexion: PROM Shoulder ABduction: PROM End of Session OT - End of Session Activity Tolerance: Patient limited by fatigue Patient left: in bed;with call bell in reach;with  family/visitor present General Behavior During Session: Agitated Cognition: Impaired   Lisa Roca OTR/L 02/03/2011, 2:41 PM

## 2011-02-03 NOTE — Progress Notes (Signed)
CSW spoke again with pt's wife regarding d/c plan.  She reports they are hopeful [facility] will accept pt but again refuses for CSW to fax out referral to in network SNFs.  Wife states they will work something out at home if not approved as their daughter lives across the street and has a flexible schedule.    Bruce [name]

## 2011-02-03 NOTE — Progress Notes (Signed)
Physical Therapy Treatment Patient Details Name: Bruce [name] MRN: [id number] DOB: [birth year] Today's Date: 02/03/2011 Time: 10:20- 11:10 Charge: therapeutic activity 50 min PT Assessment/Plan  PT - Assessment/Plan Comments on Treatment Session: Max cueing for technique to assist with rolling and hand placement, pt became agitated at end of session secondary to the increased pain at end of session.  Pt was pain free at entrance and throughtout the bed exercises but stated pain increased to 8/10 following the mobility part of session.  PT Goals  Acute Rehab PT Goals PT Goal: Rolling Supine to Right Side - Progress: Not met PT Goal: Rolling Supine to Left Side - Progress: Not met PT Goal: Supine/Side to Sit - Progress: Not met PT Goal: Sit to Supine/Side - Progress: Not met PT Goal: Sit to Stand - Progress: Not met PT Goal: Stand to Sit - Progress: Not met PT Transfer Goal: Bed to Chair/Chair to Bed - Progress: Not met  PT Treatment Precautions/Restrictions  Precautions Precautions: Fall Restrictions Weight Bearing Restrictions: Yes LUE Weight Bearing:  (not specified, but likely NWB L wrist.  ) LLE Weight Bearing: Touchdown weight bearing Mobility (including Balance) Bed Mobility Bed Mobility: Yes Rolling Right: 2: Max assist Rolling Right Details (indicate cue type and reason): Max assist of trunk and left leg.  Max vc-ing for technique and hand placement Rolling Left: 2: Max assist Rolling Left Details (indicate cue type and reason): Max assist of trunk and left leg.  Max vc-ing for technique and hand placement Transfers Transfers: No Ambulation/Gait Ambulation/Gait: No Stairs: No    Exercise  General Exercises - Lower Extremity Ankle Circles/Pumps: AROM;Both;10 reps;Supine Quad Sets: AROM;Right;AAROM;Left;10 reps;Supine Short Arc Quad: AROM;Right;AAROM;Left;10 reps;Supine Heel Slides: AROM;Right;AAROM;Left;10 reps;Supine End of Session PT - End of Session Activity  Tolerance: Patient limited by pain Patient left: in bed;with call bell in reach;with family/visitor present (wife in room) General Behavior During Session: Agitated Cognition: WFL for tasks performed  Juel Burrow 02/03/2011, 11:35 AM

## 2011-02-03 NOTE — Patient Instructions (Signed)
Patient/ Family  And caregivers  instructed in: need for continued use of O2 (patient removed earlier); need for sitting up EOB and health benefits,  Need for Movement of LUE; transfer training and need to maintain TTWB on LLE and not to put traction or wt. Through LUE. Family stated understanding.

## 2011-02-03 NOTE — Progress Notes (Signed)
I have opened the case to be considered for Inpatient Rehab with [employer] this am. I faxed all clinicals and was contacted by [facility] case manager this afternoon stating that they are having delays with faxed clinicals been attached to the case and there would not be a decision made regarding approval or denial before closing time today. She also states that [employer] is closed on Monday. She states that she will give me decision early Tuesday morning regarding Inpatient rehab. Made family aware and case management. Anticipate approval, but will have to wait and see. Thanks again. Toni Amend [phone].

## 2011-02-04 DIAGNOSIS — R410 Disorientation, unspecified: Secondary | ICD-10-CM | POA: Diagnosis not present

## 2011-02-04 DIAGNOSIS — F329 Major depressive disorder, single episode, unspecified: Secondary | ICD-10-CM | POA: Diagnosis present

## 2011-02-04 LAB — BASIC METABOLIC PANEL
BUN: 13 mg/dL (ref 6–23)
Calcium: 8.7 mg/dL (ref 8.4–10.5)
Creatinine, Ser: 0.92 mg/dL (ref 0.50–1.35)
GFR calc Af Amer: >90 mL/min (ref >90)
GFR calc non Af Amer: 88 mL/min — ABNORMAL LOW (ref >90)

## 2011-02-04 LAB — CBC
MCHC: 35.1 g/dL (ref 30.0–36.0)
Platelets: 113 10*3/uL — ABNORMAL LOW (ref 150–400)
RDW: 12.9 % (ref 11.5–15.5)
WBC: 9 10*3/uL (ref 4.0–10.5)

## 2011-02-04 LAB — DIFFERENTIAL
Basophils Absolute: 0 10*3/uL (ref 0.0–0.1)
Eosinophils Absolute: 0.4 10*3/uL (ref 0.0–0.7)
Lymphocytes Relative: 10 % — ABNORMAL LOW (ref 12–46)
Monocytes Relative: 8 % (ref 3–12)

## 2011-02-04 MED ORDER — OXYCODONE-ACETAMINOPHEN 5-325 MG PO TABS
1.0000 | ORAL_TABLET | ORAL | Status: DC | PRN
  Administered 2011-02-04 – 2011-02-05 (×3): 2 via ORAL
  Administered 2011-02-05: 1 via ORAL
  Administered 2011-02-05: 2 via ORAL
  Administered 2011-02-05: 1 via ORAL
  Administered 2011-02-06 – 2011-02-07 (×7): 2 via ORAL
  Filled 2011-02-04 (×7): qty 2
  Filled 2011-02-04: qty 1
  Filled 2011-02-04 (×4): qty 2
  Filled 2011-02-04: qty 1

## 2011-02-04 MED ORDER — METHOCARBAMOL 500 MG PO TABS
500.0000 mg | ORAL_TABLET | Freq: Four times a day (QID) | ORAL | Status: DC | PRN
  Administered 2011-02-04 – 2011-02-07 (×8): 500 mg via ORAL
  Filled 2011-02-04 (×8): qty 1

## 2011-02-04 NOTE — Progress Notes (Signed)
Subjective: The patient is somewhat groggy this morning. His daughter is in the room. At times, she says that he appears confused. The patient has no complaints of chest pain, shortness of breath, or left hip pain currently.  Objective: Vital signs in last 24 hours: Filed Vitals:   02/04/11 0400 02/04/11 0433 02/04/11 0754 02/04/11 0821  BP:  135/77    Pulse:  69    Temp:  97.6 F (36.4 C)    TempSrc:  Oral    Resp: 20 24  20   Height:      Weight:      SpO2: 96% 97% 96% 96%   Weight change:   Intake/Output Summary (Last 24 hours) at 02/04/11 1232 Last data filed at 02/04/11 0800  Gross per 24 hour  Intake 2204.83 ml  Output   1200 ml  Net 1004.83 ml   Physical Exam: General: Sleeping, but arousable. Lungs clear to auscultation bilaterally without wheeze rhonchi or rales Cardiovascular regular rate rhythm without murmurs gallops rubs Abdomen obese soft nontender nondistended Extremities splint on the left arm. Large dressing covering the left hip. The Hemovac has been removed. He has a trace of pedal edema. No pedal edema on the right lower extremity. Neurologic: He is initially lethargic but becomes more alert. He is slow to answer. His cranial nerves are all intact..  Lab Results: Basic Metabolic Panel:  Lab 02/04/11 1610 02/03/11 0519 02/01/11 0516  NA 132* 132* --  K 4.2 3.9 --  CL 99 99 --  CO2 26 26 --  GLUCOSE 110* 126* --  BUN 13 11 --  CREATININE 0.92 0.94 --  CALCIUM 8.7 8.5 --  MG -- -- 1.8  PHOS -- -- --   Liver Function Tests:  Lab 01/31/11 1731  AST 17  ALT 15  ALKPHOS 81  BILITOT 0.6  PROT 6.7  ALBUMIN 3.7   No results found for this basename: LIPASE:2,AMYLASE:2 in the last 168 hours No results found for this basename: AMMONIA:2 in the last 168 hours CBC:  Lab 02/04/11 0634 02/03/11 0519  WBC 9.0 9.5  NEUTROABS 7.0 7.2  HGB 9.5* 9.6*  HCT 27.1* 27.6*  MCV 90.0 89.9  PLT 113* 99*   Cardiac Enzymes: No results found for this  basename: CKTOTAL:3,CKMB:3,CKMBINDEX:3,TROPONINI:3 in the last 168 hours BNP: No results found for this basename: PROBNP:3 in the last 168 hours D-Dimer: No results found for this basename: DDIMER:2 in the last 168 hours CBG: No results found for this basename: GLUCAP:6 in the last 168 hours Hemoglobin A1C: No results found for this basename: HGBA1C in the last 168 hours Fasting Lipid Panel: No results found for this basename: CHOL,HDL,LDLCALC,TRIG,CHOLHDL,LDLDIRECT in the last 960 hours Thyroid Function Tests:  Lab 02/01/11 0516  TSH 3.128  T4TOTAL --  FREET4 --  T3FREE --  THYROIDAB --   Coagulation:  Lab 02/01/11 0516  LABPROT 15.8*  INR 1.23    Micro Results: No results found for this or any previous visit (from the past 240 hour(s)). Studies/Results: No results found. Scheduled Meds:    . tenormin  25 mg Oral QHS  . budesonide-formoterol  2 puff Inhalation BID  . citalopram  20 mg Oral QHS  . enoxaparin (LOVENOX) injection  40 mg Subcutaneous Q24H  . LORazepam  0.5 mg Intravenous Once  . omega-3 acid ethyl esters  1 g Oral QHS  . senna-docusate  1 tablet Oral BID  . simvastatin  20 mg Oral Daily  . vancomycin  1,000 mg Intravenous Once  . DISCONTD: HYDROmorphone PCA 0.3 mg/mL   Intravenous Q4H  . DISCONTD: zolpidem  5 mg Oral QHS   Continuous Infusions:    . 0.9 % NaCl with KCl 20 mEq / L 950 mL (02/04/11 1037)   PRN Meds:.acetaminophen, acetaminophen, albuterol, bisacodyl, diphenhydrAMINE, diphenhydrAMINE, LORazepam, magnesium hydroxide, methocarbamol, naloxone, ondansetron (ZOFRAN) IV, oxyCODONE-acetaminophen, promethazine, sodium chloride, traZODone, DISCONTD:  HYDROmorphone (DILAUDID) injection, DISCONTD: methocarbamol Assessment/Plan: Active Problems:  Eosinophilia  COPD (chronic obstructive pulmonary disease)  Hip fracture, intertrochanteric  Wrist fracture  Thrombocytopenia  Anemia  Hyponatremia  Depression  Confusion  His mild confusion is  likely secondary to the hydromorphone PCA pump. This will be discontinued. As needed Percocet will be ordered. This was discussed with Dr. Hilda Lias who agrees. The patient is followed by Dr. Jodene Nam for chronic thrombocytopenia. His platelet count has been more or less stable. He has blood loss anemia and was transfused one unit of packed red blood cells postoperatively. His hemoglobin has decreased 2 g since the transfusion but has been stable over the last 24 hours.. His serum sodium has decreased a little. Will decrease the gentle IV fluids and check his serum sodium in the morning. If it is lower, will restart the IV fluids and if it is not, will keep the rate at [facility]. His COPD is stable. Celexa was restarted yesterday for treatment of chronic depression. He is postoperative day #3.  Continue current management. Insurance approval for inpatient rehabilitation at [facility] is pending.   LOS: 4 days   Bruce [name] 02/04/2011, 12:32 PM

## 2011-02-04 NOTE — Progress Notes (Signed)
Subjective: 3 Days Post-Op Procedure(s) (LRB): COMPRESSION HIP (Left) OPEN REDUCTION INTERNAL FIXATION (ORIF) DISTAL FEMUR FRACTURE (Left) Patient reports pain as 2 on 0-10 scale.    Objective: Vital signs in last 24 hours: Temp:  [97.6 F (36.4 C)-99.7 F (37.6 C)] 97.6 F (36.4 C) (01/19 0433) Pulse Rate:  [68-82] 69  (01/19 0433) Resp:  [18-24] 20  (01/19 0821) BP: (114-151)/(69-106) 135/77 mmHg (01/19 0433) SpO2:  [95 %-98 %] 96 % (01/19 0821)  Intake/Output from previous day: 01/18 0701 - 01/19 0700 In: 2684.8 [P.O.:780; I.V.:1904.8] Out: 800 [Urine:800] Intake/Output this shift: Total I/O In: -  Out: 600 [Urine:600]   Basename 02/04/11 0634 02/03/11 0519 02/02/11 0521 02/01/11 1505  HGB 9.5* 9.6* 11.6* 11.9*    Basename 02/04/11 0634 02/03/11 0519  WBC 9.0 9.5  RBC 3.01* 3.07*  HCT 27.1* 27.6*  PLT 113* 99*    Basename 02/04/11 0634 02/03/11 0519  NA 132* 132*  K 4.2 3.9  CL 99 99  CO2 26 26  BUN 13 11  CREATININE 0.92 0.94  GLUCOSE 110* 126*  CALCIUM 8.7 8.5   No results found for this basename: LABPT:2,INR:2 in the last 72 hours  Neurovascular intact Sensation intact distally Intact pulses distally Dorsiflexion/Plantar flexion intact Incision: scant drainage No cellulitis present  He got confused last night.  This is most likely related to pain medicine.  It will be changed today.  Continue incentive spirometry.    Awaiting decision by his insurance company about rehab.  Hgb stable at 9.5.  Assessment/Plan: 3 Days Post-Op Procedure(s) (LRB): COMPRESSION HIP (Left) OPEN REDUCTION INTERNAL FIXATION (ORIF) DISTAL FEMUR FRACTURE (Left) Up with therapy  Bruce [name] 02/04/2011, 10:45 AM

## 2011-02-04 NOTE — Progress Notes (Signed)
Physical Therapy Treatment Patient Details Name: Bruce [name] MRN: [id number] DOB: 08/07/1945 Today's Date: 02/04/2011  PT Assessment/Plan  PT - Assessment/Plan Comments on Treatment Session: Patient has made active participation towards therapy today. Patient was awake and alert all throughout therapy. Patient was able to perform sit<>stand activities 2X with PT in front at  Max A (rising) and Mod A  (sitting on EOB). TTWB status maintained on LLE. Patient has voiced pain on (L) hip region (PS4/10), no c/o on LUE. Pt will benefit SNF placement to work more on increasing safety with mobility towards reaching PLOF.  PT Plan: Frequency remains appropriate PT Frequency: Min 6X/week Follow Up Recommendations: Skilled nursing facility;Inpatient Rehab Equipment Recommended: Defer to next venue PT Goals  Acute Rehab PT Goals PT Goal Formulation: With patient Time For Goal Achievement: 2 weeks Pt will Roll Supine to Right Side: with min assist Pt will Roll Supine to Left Side: with min assist Pt will go Supine/Side to Sit: with min assist Pt will go Sit to Supine/Side: with min assist Pt will go Sit to Stand: with mod assist Pt will go Stand to Sit: with min assist PT Goal: Stand to Sit - Progress: Goal set today Pt will Transfer Bed to Chair/Chair to Bed: with mod assist  PT Treatment Precautions/Restrictions  Precautions Precautions: Fall Required Braces or Orthoses: Yes Other Brace/Splint: half cast applied by surgical team to left wrist elbow (sugar tong MP to elbow) Restrictions Weight Bearing Restrictions: Yes LUE Weight Bearing:  (not specified, but likely NWB on LUE ) LLE Weight Bearing: Touchdown weight bearing Mobility (including Balance) Bed Mobility Rolling Right: 3: Mod assist Right Sidelying to Sit: 2: Max assist;HOB elevated (comment degrees) Supine to Sit: 3: Mod assist Sitting - Scoot to Edge of Bed: 3: Mod assist Sit to Supine: 2: Max  assist Transfers Transfers: No Ambulation/Gait Ambulation/Gait: No  Posture/Postural Control Posture/Postural Control: No significant limitations Balance Balance Assessed: Yes Static Sitting Balance Static Sitting - Level of Assistance: 4: Min assist Exercise  Total Joint Exercises Ankle Circles/Pumps: AAROM;Both;20 reps;Supine;AROM Quad Sets: AROM;Both;10 reps;Supine Heel Slides: AAROM;10 reps;Supine;AROM Hip ABduction/ADduction: Supine;10 reps;AAROM;AROM End of Session PT - End of Session Equipment Utilized During Treatment: Gait belt Activity Tolerance: Patient limited by fatigue;Patient limited by pain Patient left: in bed;with call bell in reach;with bed alarm set;with family/visitor present Nurse Communication: Mobility status for transfers;Weight bearing status General Behavior During Session: [facility] for tasks performed Cognition: [facility] for tasks performed  Nicie Milan, Larna Daughters 02/04/2011, 2:15 PM

## 2011-02-05 DIAGNOSIS — R001 Bradycardia, unspecified: Secondary | ICD-10-CM | POA: Diagnosis not present

## 2011-02-05 LAB — BASIC METABOLIC PANEL
BUN: 17 mg/dL (ref 6–23)
Calcium: 9.1 mg/dL (ref 8.4–10.5)
GFR calc Af Amer: >90 mL/min (ref >90)
GFR calc non Af Amer: 89 mL/min — ABNORMAL LOW (ref >90)
Glucose, Bld: 97 mg/dL (ref 70–99)
Potassium: 3.9 mEq/L (ref 3.5–5.1)
Sodium: 135 mEq/L (ref 135–145)

## 2011-02-05 MED ORDER — POLYETHYLENE GLYCOL 3350 17 G PO PACK
17.0000 g | PACK | Freq: Every day | ORAL | Status: DC
  Administered 2011-02-05 – 2011-02-07 (×3): 17 g via ORAL
  Filled 2011-02-05 (×3): qty 1

## 2011-02-05 MED ORDER — ATENOLOL 25 MG PO TABS
12.5000 mg | ORAL_TABLET | Freq: Every day | ORAL | Status: DC
  Administered 2011-02-05 – 2011-02-06 (×2): 12.5 mg via ORAL
  Filled 2011-02-05 (×2): qty 1

## 2011-02-05 NOTE — Progress Notes (Signed)
Subjective: The patient is more alert off of Dilaudid PCA. No new complaints.  Objective: Vital signs in last 24 hours: Filed Vitals:   02/05/11 0210 02/05/11 0453 02/05/11 0803 02/05/11 1336  BP: 106/57 103/57  114/66  Pulse: 56 55  61  Temp: 98.9 F (37.2 C) 98.6 F (37 C)  97.9 F (36.6 C)  TempSrc: Oral Oral  Oral  Resp: 20 20  18   Height:      Weight:      SpO2: 95% 95% 93% 92%   Weight change:   Intake/Output Summary (Last 24 hours) at 02/05/11 1340 Last data filed at 02/05/11 0900  Gross per 24 hour  Intake 796.17 ml  Output    300 ml  Net 496.17 ml   Physical Exam: General: Alert and in no acute distress. Lungs clear to auscultation bilaterally without wheeze rhonchi or rales Cardiovascular: S1, S2, with bradycardia. Abdomen obese soft nontender nondistended Extremities splint on the left arm. Large dressing covering the left hip. The Hemovac has been removed. He has a trace of pedal edema. No pedal edema on the right lower extremity. Neurologic: He is alert and oriented x2.  Lab Results: Basic Metabolic Panel:  Lab 02/05/11 4540 02/04/11 0634 02/01/11 0516  NA 135 132* --  K 3.9 4.2 --  CL 100 99 --  CO2 26 26 --  GLUCOSE 97 110* --  BUN 17 13 --  CREATININE 0.89 0.92 --  CALCIUM 9.1 8.7 --  MG -- -- 1.8  PHOS -- -- --   Liver Function Tests:  Lab 01/31/11 1731  AST 17  ALT 15  ALKPHOS 81  BILITOT 0.6  PROT 6.7  ALBUMIN 3.7   No results found for this basename: LIPASE:2,AMYLASE:2 in the last 168 hours No results found for this basename: AMMONIA:2 in the last 168 hours CBC:  Lab 02/04/11 0634 02/03/11 0519  WBC 9.0 9.5  NEUTROABS 7.0 7.2  HGB 9.5* 9.6*  HCT 27.1* 27.6*  MCV 90.0 89.9  PLT 113* 99*   Cardiac Enzymes: No results found for this basename: CKTOTAL:3,CKMB:3,CKMBINDEX:3,TROPONINI:3 in the last 168 hours BNP: No results found for this basename: PROBNP:3 in the last 168 hours D-Dimer: No results found for this basename:  DDIMER:2 in the last 168 hours CBG: No results found for this basename: GLUCAP:6 in the last 168 hours Hemoglobin A1C: No results found for this basename: HGBA1C in the last 168 hours Fasting Lipid Panel: No results found for this basename: CHOL,HDL,LDLCALC,TRIG,CHOLHDL,LDLDIRECT in the last 981 hours Thyroid Function Tests:  Lab 02/01/11 0516  TSH 3.128  T4TOTAL --  FREET4 --  T3FREE --  THYROIDAB --   Coagulation:  Lab 02/01/11 0516  LABPROT 15.8*  INR 1.23    Micro Results: No results found for this or any previous visit (from the past 240 hour(s)). Studies/Results: No results found. Scheduled Meds:    . tenormin  12.5 mg Oral QHS  . budesonide-formoterol  2 puff Inhalation BID  . citalopram  20 mg Oral QHS  . enoxaparin (LOVENOX) injection  40 mg Subcutaneous Q24H  . omega-3 acid ethyl esters  1 g Oral QHS  . polyethylene glycol  17 g Oral Daily  . senna-docusate  1 tablet Oral BID  . simvastatin  20 mg Oral Daily  . vancomycin  1,000 mg Intravenous Once  . DISCONTD: tenormin  25 mg Oral QHS   Continuous Infusions:    . 0.9 % NaCl with KCl 20 mEq / L  950 mL (02/04/11 1037)   PRN Meds:.acetaminophen, acetaminophen, albuterol, bisacodyl, diphenhydrAMINE, diphenhydrAMINE, magnesium hydroxide, methocarbamol, naloxone, ondansetron (ZOFRAN) IV, oxyCODONE-acetaminophen, promethazine, sodium chloride, traZODone Assessment/Plan: Active Problems:  Eosinophilia  COPD (chronic obstructive pulmonary disease)  Hip fracture, intertrochanteric  Wrist fracture  Thrombocytopenia  Anemia  Hyponatremia  Depression  Confusion  Bradycardia  His mild confusion was likely secondary to the hydromorphone PCA pump and is now resolved with the discontinuation of the PCA pump. As needed Percocet has been ordered. He is bradycardic, but asymptomatic. It is secondary to atenolol. Given that his blood pressure is on the lower end of normal, the dose of the atenolol will be decreased  and parameters will be placed for heart rate. The patient is followed by Dr. Jodene Nam for chronic thrombocytopenia. His platelet count has been more or less stable. He has blood loss anemia and was transfused one unit of packed red blood cells postoperatively. His hemoglobin has decreased 2 g since the transfusion but has been stable over the last 24 hours.. His serum sodium has normalized. His COPD is stable. Celexa was restarted for treatment of chronic depression. He is postoperative day #4.  Insurance approval for inpatient rehabilitation at [facility] is pending.   LOS: 5 days   Indiyah Paone 02/05/2011, 1:40 PM

## 2011-02-05 NOTE — Progress Notes (Signed)
Subjective: 4 Days Post-Op Procedure(s) (LRB): COMPRESSION HIP (Left) OPEN REDUCTION INTERNAL FIXATION (ORIF) DISTAL FEMUR FRACTURE (Left) Patient reports pain as 2 on 0-10 scale.    Objective: Vital signs in last 24 hours: Temp:  [98.3 F (36.8 C)-98.9 F (37.2 C)] 98.6 F (37 C) (01/20 0453) Pulse Rate:  [55-69] 55  (01/20 0453) Resp:  [20-22] 20  (01/20 0453) BP: (103-161)/(57-85) 103/57 mmHg (01/20 0453) SpO2:  [93 %-98 %] 93 % (01/20 0803)  Intake/Output from previous day: 01/19 0701 - 01/20 0700 In: 556.2 [P.O.:120; I.V.:436.2] Out: 900 [Urine:900] Intake/Output this shift: Total I/O In: 240 [P.O.:240] Out: -    Basename 02/04/11 0634 02/03/11 0519  HGB 9.5* 9.6*    Basename 02/04/11 0634 02/03/11 0519  WBC 9.0 9.5  RBC 3.01* 3.07*  HCT 27.1* 27.6*  PLT 113* 99*    Basename 02/05/11 0610 02/04/11 0634  NA 135 132*  K 3.9 4.2  CL 100 99  CO2 26 26  BUN 17 13  CREATININE 0.89 0.92  GLUCOSE 97 110*  CALCIUM 9.1 8.7   No results found for this basename: LABPT:2,INR:2 in the last 72 hours  Neurologically intact Neurovascular intact Sensation intact distally Intact pulses distally Dorsiflexion/Plantar flexion intact Incision: no drainage Compartment soft  He is alert and oriented today.  He has shaven.  He feels much better.  I will change sugar tong splint on the left to a cock-up splint.  Assessment/Plan: 4 Days Post-Op Procedure(s) (LRB): COMPRESSION HIP (Left) OPEN REDUCTION INTERNAL FIXATION (ORIF) DISTAL FEMUR FRACTURE (Left) Up with therapy  Hussam Muniz 02/05/2011, 12:47 PM

## 2011-02-05 NOTE — Progress Notes (Signed)
Upon assessment at 0000 patient had partially unwrapped his ACE bandage on the wrist. When he woke up, he said that he sometimes messed with it in his sleep. He told me that he asked a girl earlier when this was going to be splinted. The nurse aide said that he asked him when it would be splinted,and the patient seemed oriented at that time. I was unsure if the patient may have become confused at some time between my first interaction with him and my assessment time at 0000. The patient was alert and oriented to person, place and time upon assessment. I assessed him again at 0211 the patient remains alert and orient to person, place and time. Pt complains of some tingling in his left fingertips like his hand is falling asleep. I asked again at 0400 and the patient still had some tingling in his fingertips. At this time, he said that it has been going on a couple days. I notified Dr. Rito Ehrlich at 0530 when he came to the floor. No orders received. Will continue to monitor. Sheryn Bison

## 2011-02-06 LAB — CBC
MCH: 31.1 pg (ref 26.0–34.0)
Platelets: 172 10*3/uL (ref 150–400)
RBC: 2.93 MIL/uL — ABNORMAL LOW (ref 4.22–5.81)

## 2011-02-06 MED ORDER — BUDESONIDE-FORMOTEROL FUMARATE 80-4.5 MCG/ACT IN AERO
INHALATION_SPRAY | RESPIRATORY_TRACT | Status: AC
  Filled 2011-02-06: qty 6.9

## 2011-02-06 NOTE — PMR Pre-admission (Signed)
PMR Admission Coordinator Pre-Admission Assessment  Patient:  Bruce [name] is an 66 y.o., male MRN:  [id number] DOB:  21-Jul-1945 Height:  5' 7.5" (171.5 cm) Weight:  122.562 kg (270 lb 3.2 oz)  Insurance Information: Patient approved for CIR 02/07/11-02/15/11 with updates to CM on 02/14/11.      PPO:X          IPA:     80/20:     OTHER: PRIMARY:BCBS      Policy#:[id number]      Subscriber:patient CM Name:Lynn Hughs      Phone#:[phone]     MWN#:[phone] Pre-Cert#:[id number]      Employer:self Benefits:  Phone #:[phone]     Name:Cheryl Eff. Date:03/31/10     Deduct:$2500/$60met      Out of Pocket Max:$3000/$50met      Life VFI:EPPIR CIR:70/30% after ded      SNF:70/30% after ded Outpatient: 70%     Co-Pay:30% 30 visits combined  Home Health:70%      Co-Pay:30% visits combined with HH DME:70%     Co-Pay:30% Providers:in Educational psychologist involved at [employer] regarding assistance [facility])   Current Medical History:   Patient Admitting Diagnosis:L-intertrochanteric hip fracture with ORIF, L-wrist fracture  History of Present Illness: Patient is a 66yo male with history of COPD with occasional use of albuterol inhalers. Patient on 01/31/11 missed a step on ladder and fell sustaining injuries above. Fall was not associated with dizziness, palpitations, shortness of breath or chest pain. Patient underwent an ORIF of the Left femur/hip on 02/01/11 which he tolerated without complications. Patient remains TDWB on L-lower extremity and continues to progress with therapies.   Patients Past Medical History:   Past Medical History  Diagnosis Date  . Eosinophilia   . Thrombocytopenia   . COPD (chronic obstructive pulmonary disease)   . Obesity   . Degenerative joint disease   . Depression   . Gout   . Anemia 02/02/2011  . Hyperlipidemia   . Hip fracture, left January 2013.    Status post ORIF   Family Medical History:  family history includes Cancer in his  brother, mother, and sister and Emphysema in his father.  Patients Current Diet: General  Prior Rehab/Hospitalizations: per family patient has never been in Inpatient Rehab before  Medications   Current Medications: Current facility-administered medications:0.9 % NaCl with KCl 20 mEq/ L  infusion, , Intravenous, Continuous, Elliot Cousin, MD, Last Rate: 10 mL/hr at 02/04/11 1037, 950 mL at 02/04/11 1037;  acetaminophen (TYLENOL) suppository 650 mg, 650 mg, Rectal, Q4H PRN, Vania Rea, MD;  acetaminophen (TYLENOL) tablet 650 mg, 650 mg, Oral, Q4H PRN, Vania Rea, MD, 650 mg at 02/05/11 2112 albuterol (PROVENTIL) (5 MG/ML) 0.5% nebulizer solution 2.5 mg, 2.5 mg, Nebulization, Q4H PRN, Vania Rea, MD;  atenolol (TENORMIN) tablet 12.5 mg, 12.5 mg, Oral, QHS, Elliot Cousin, MD, 12.5 mg at 02/06/11 2200;  bisacodyl (DULCOLAX) suppository 10 mg, 10 mg, Rectal, Daily PRN, Vania Rea, MD;  budesonide-formoterol [facility]) 80-4.5 MCG/ACT inhaler 2 puff, 2 puff, Inhalation, BID, Vania Rea, MD, 2 puff at 02/07/11 0730 citalopram (CELEXA) tablet 20 mg, 20 mg, Oral, QHS, Elliot Cousin, MD, 20 mg at 02/06/11 2200;  diphenhydrAMINE (BENADRYL) 12.5 MG/5ML elixir 12.5 mg, 12.5 mg, Oral, Q6H PRN, Darreld Mclean, MD, 12.5 mg at 02/02/11 2107;  diphenhydrAMINE (BENADRYL) injection 12.5 mg, 12.5 mg, Intravenous, Q6H PRN, Darreld Mclean, MD;  enoxaparin (LOVENOX) injection 40 mg, 40 mg, Subcutaneous, Q24H, Darreld Mclean, MD, 40 mg at 02/06/11 1652  magnesium hydroxide (MILK OF MAGNESIA) suspension 30 mL, 30 mL, Oral, Daily PRN, Darreld Mclean, MD;  methocarbamol (ROBAXIN) tablet 500 mg, 500 mg, Oral, Q6H PRN, Elliot Cousin, MD, 500 mg at 02/06/11 2200;  naloxone [facility]) injection 0.4 mg, 0.4 mg, Intravenous, PRN, Darreld Mclean, MD;  omega-3 acid ethyl esters (LOVAZA) capsule 1 g, 1 g, Oral, QHS, Vania Rea, MD, 1 g at 02/06/11 2200 ondansetron (ZOFRAN) injection 4 mg, 4 mg, Intravenous, Q6H  PRN, Darreld Mclean, MD;  oxyCODONE-acetaminophen (PERCOCET) 5-325 MG per tablet 1-2 tablet, 1-2 tablet, Oral, Q4H PRN, Elliot Cousin, MD, 2 tablet at 02/07/11 [id number];  polyethylene glycol (MIRALAX / GLYCOLAX) packet 17 g, 17 g, Oral, Daily, Elliot Cousin, MD, 17 g at 02/06/11 0914;  promethazine (PHENERGAN) injection 12.5 mg, 12.5 mg, Intramuscular, Q4H PRN, Darreld Mclean, MD senna-docusate (Senokot-S) tablet 1 tablet, 1 tablet, Oral, BID, Elliot Cousin, MD, 1 tablet at 02/06/11 2200;  simvastatin (ZOCOR) tablet 20 mg, 20 mg, Oral, Daily, Vania Rea, MD, 20 mg at 02/06/11 0914;  sodium chloride 0.9 % injection 9 mL, 9 mL, Intravenous, PRN, Darreld Mclean, MD;  traZODone (DESYREL) tablet 25 mg, 25 mg, Oral, QHS PRN, Vania Rea, MD, 25 mg at 02/07/11 0230 vancomycin (VANCOCIN) IVPB 1000 mg/200 mL premix, 1,000 mg, Intravenous, Once, Caryl Asp, [place]  Precautions/Special Needs:    Additional Precautions/Restrictions: Precautions Precautions: Fall Precaution Comments: Hip  Required Braces or Orthoses: Yes Other Brace/Splint: Now has splint for wrist.  Restrictions Weight Bearing Restrictions: Yes LUE Weight Bearing: Weight bearing as tolerated LLE Weight Bearing: Touchdown weight bearing  Therapy Assessments Physical Therapy: Precautions Precautions: Fall Precaution Comments: Hip  Required Braces or Orthoses: Yes Other Brace/Splint: Now has splint for wrist.  Home Living Lives With: Spouse Receives Help From: Family Type of Home: House Home Layout: One level Home Access: Stairs to enter Entrance Stairs-Rails: None Entrance Stairs-Number of Steps: 1 (in carport) Bathroom Shower/Tub: Engineer, manufacturing systems: Standard Bathroom Accessibility: Yes Home Adaptive Equipment: Grab bars in shower;Hand-held shower hose;Shower chair with back;Straight cane Prior Function Level of Independence: Independent with basic ADLs;Independent with homemaking with  ambulation;Independent with transfers Able to Take Stairs?: Yes Driving: Yes Vocation: Retired Educational psychologist Movements are Fluid and Coordinated: No Fine Motor Movements are Fluid and Coordinated: No Coordination and Movement Description: Left hand casted.   Occupational Therapy: Precautions Precautions: Fall Precaution Comments: Hip  Required Braces or Orthoses: Yes Other Brace/Splint: Now has splint for wrist.  Home Living Lives With: Spouse Receives Help From: Family Type of Home: House Home Layout: One level Home Access: Stairs to enter Entrance Stairs-Rails: None Entrance Stairs-Number of Steps: 1 (in carport) Bathroom Shower/Tub: Engineer, manufacturing systems: Standard Bathroom Accessibility: Yes Home Adaptive Equipment: Grab bars in shower;Hand-held shower hose;Shower chair with back;Straight cane Prior Function Level of Independence: Independent with basic ADLs;Independent with homemaking with ambulation;Independent with transfers Able to Take Stairs?: Yes Driving: Yes Vocation: Retired Educational psychologist Movements are Fluid and Coordinated: No Fine Motor Movements are Fluid and Coordinated: No Coordination and Movement Description: Left hand casted.  Restrictions Weight Bearing Restrictions: Yes LUE Weight Bearing: Weight bearing as tolerated LLE Weight Bearing: Touchdown weight bearing  ADL Eating/Feeding: Simulated Where Assessed - Eating/Feeding: Edge of bed Grooming: Wash/dry face;Wash/dry hands;Supervision/safety;Set up Grooming Details (indicate cue type and reason): Applied deoderant Where Assessed - Grooming: Sitting, bed (Sitting EOB no support required) Upper Body Bathing: Not assessed Lower Body Bathing: Not assessed Upper Body Dressing: Not assessed Lower Body Dressing: Not  assessed Toilet Transfer: Not assessed Toilet Transfer Method: Other (comment) (Patient resistant to being transfered even to bedside chair) Toileting -  Hygiene: Not assessed Tub/Shower Transfer: Not assessed  SLP Recommendations: Equipment Recommended: Defer to next venue    Prior Function: Level of Independence: Independent with basic ADLs;Independent with homemaking with ambulation;Independent with transfers Able to Take Stairs?: Yes Driving: Yes Vocation: Retired ADL Eating/Feeding: Simulated Where Assessed - Eating/Feeding: Edge of bed Grooming: Wash/dry face;Wash/dry hands;Supervision/safety;Set up Grooming Details (indicate cue type and reason): Applied deoderant Where Assessed - Grooming: Sitting, bed (Sitting EOB no support required) Upper Body Bathing: Not assessed Lower Body Bathing: Not assessed Upper Body Dressing: Not assessed Lower Body Dressing: Not assessed Toilet Transfer: Not assessed Toilet Transfer Method: Other (comment) (Patient resistant to being transfered even to bedside chair) Toileting - Hygiene: Not assessed Tub/Shower Transfer: Not assessed  Additional Prior Functional Levels:  Bed Mobility: Independent Transfers: independent Mobility - Walk/Wheelchair: independent Upper Body Dressing: independent Lower Body Dressing: independent Grooming: independent Eating/Drinking: independent Toilet Transfer: independent Bladder Continence: independent Bowel Management: independent Stair Climbing: independent Communication: independent Memory: independent Cooking/Meal Prep: independent Housework: independent Money Management: independent Driving: independent  Prior Activity Level: Community (5-7x/wk): active in community  Current Functional Levels: ADLs/Mobility: ADL Eating/Feeding: Simulated Where Assessed - Eating/Feeding: Edge of bed Grooming: Wash/dry face;Wash/dry hands;Supervision/safety;Set up Grooming Details (indicate cue type and reason): Applied deoderant Where Assessed - Grooming: Sitting, bed (Sitting EOB no support required) Upper Body Bathing: Not assessed Lower Body Bathing:  Not assessed Upper Body Dressing: Not assessed Lower Body Dressing: Not assessed Toilet Transfer: Not assessed Toilet Transfer Method: Other (comment) (Patient resistant to being transfered even to bedside chair) Toileting - Hygiene: Not assessed Tub/Shower Transfer: Not assessed  Bed Mobility Bed Mobility: Yes Rolling Right: 4: Min assist Rolling Right Details (indicate cue type and reason): Mod Asssist of 2; max verbal cues assist and hand placement, assist minimally at LLE leg. Rolling Left: 2: Max assist Rolling Left Details (indicate cue type and reason): Max assist of trunk and left leg.  Max vc-ing for technique and hand placement Right Sidelying to Sit: 2: Max assist;HOB elevated (comment degrees) Right Sidelying to Sit Details (indicate cue type and reason): Mod of 2 Supine to Sit: 4: Min assist Supine to Sit Details (indicate cue type and reason): of 2 person Sitting - Scoot to [place] of Bed: 5: Supervision Sitting - Scoot to [place] of Bed Details (indicate cue type and reason): mod assist of hips and left leg to get to EOB, unable to let left foot down towards floor secondary to pain.   Sit to Supine: 4: Min assist Sit to Supine - Details (indicate cue type and reason): Patient able to motor plan all activity much better. Scooting to [facility]: 5: Supervision Scooting to [facility] Details (indicate cue type and reason): No verbal cues required. Transfers Transfers: No Ambulation/Gait Ambulation/Gait: No Stairs: No Posture/Postural Control Posture/Postural Control: No significant limitations Balance Balance Assessed: Yes Static Sitting Balance Static Sitting - Level of Assistance: 4: Min assist  Home Assistive Devices/Equipment:  Home Assistive Devices/Equipment Home Assistive Devices/Equipment: Eyeglasses  Discharge Planning:  Living Arrangements: Spouse/significant other Support Systems: Spouse/significant other Do you have any problems obtaining your medications?: No Type of  Residence: Private residence Home Care Services: No Patient expects to be discharged to:: home Expected Discharge Date:  (to be determined after possible short rehab stay) Case Management Consult Needed: No      Previous Home Environment:  Living Arrangements: Spouse/significant  other Support Systems: Spouse/significant other Do you have any problems obtaining your medications?: No Type of Residence: Private residence Home Care Services: No Patient expects to be discharged to:: home Expected Discharge Date:  (to be determined after possible short rehab stay) Home Environment Number of Levels: 1 Previous Home Environment Number of Steps: stairs Previous Home Environment Is Bedroom on Main Floor?: Yes Previous Home Environment Is Bathroom on Main Floor?: Yes  Discharge Living Setting:   Plans for Discharge Living Setting: Patient's home;Lives with (comment);House (spouse) Discharge Living Setting Number of Levels: 1 Discharge Living Setting is Bedroom on Main Floor?: Yes Discharge Living Setting is Bathroom on Main Floor?: Yes  Social/Family/Support Systems:   Patient Roles: Spouse;Parent Anticipated Caregiver: wife and children Anticipated Caregiver's Contact Information: wife Tyler Aas cell [phone], dtr Fleet Contras [phone] Caregiver Availability: 24/7 Discharge Plan Discussed with Primary Caregiver: Yes Is Caregiver In Agreement with Plan?: Yes Does Caregiver/Family have Issues with Lodging/Transportation while Pt is in Rehab?: No  Goals/Additional Needs:   Patient/Family Goal for Rehab: to be as independent as possible again Cultural Considerations: none Equipment Needs: to be determined Pt/Family Agrees to Admission and willing to participate: Yes Program Orientation Provided & Reviewed with Pt/Caregiver Including Roles  & Responsibilities: Yes  Preadmission Screen Completed By:  Oletta Darter, 02/07/2011 7:49 AM  Patient's condition:  Patient's condition discussed with  MD and deemed appropriate for CIR.  Preadmission Screen Competed XB:JYNWG Thomas,RN Time/Date,02/07/11 at 0830.  Discussed status with Dr. Riley Kill on 02/07/11 at 0730am (time/date) and received telephone approval for admission today.  Admission Coordinator:  Oletta Darter, 02/07/11 at [phone].

## 2011-02-06 NOTE — Progress Notes (Addendum)
Subjective:  No new complaints. He says that he is much more cough. He did have a bowel movement this morning.   Objective: Vital signs in last 24 hours: Filed Vitals:   02/05/11 2107 02/05/11 2115 02/05/11 2116 02/06/11 0534  BP:  123/70  116/66  Pulse:  72  55  Temp:   98.2 F (36.8 C) 98.2 F (36.8 C)  TempSrc:   Oral Oral  Resp:   20 18  Height:      Weight:      SpO2: 93%  93% 93%   Weight change:   Intake/Output Summary (Last 24 hours) at 02/06/11 1009 Last data filed at 02/06/11 0830  Gross per 24 hour  Intake     10 ml  Output      0 ml  Net     10 ml   Physical Exam: General: Alert and in no acute distress. Lungs clear to auscultation bilaterally without wheeze rhonchi or rales Cardiovascular: S1, S2, with bradycardia. Abdomen obese soft nontender nondistended Extremities splint on the left arm. Large dressing covering the left hip. He has a trace of pedal edema. No pedal edema on the right lower extremity. Neurologic: He is alert and oriented x2.  Lab Results: Basic Metabolic Panel:  Lab 02/05/11 1610 02/04/11 0634 02/01/11 0516  NA 135 132* --  K 3.9 4.2 --  CL 100 99 --  CO2 26 26 --  GLUCOSE 97 110* --  BUN 17 13 --  CREATININE 0.89 0.92 --  CALCIUM 9.1 8.7 --  MG -- -- 1.8  PHOS -- -- --   Liver Function Tests:  Lab 01/31/11 1731  AST 17  ALT 15  ALKPHOS 81  BILITOT 0.6  PROT 6.7  ALBUMIN 3.7   No results found for this basename: LIPASE:2,AMYLASE:2 in the last 168 hours No results found for this basename: AMMONIA:2 in the last 168 hours CBC:  Lab 02/06/11 0448 02/04/11 0634 02/03/11 0519  WBC 5.1 9.0 --  NEUTROABS -- 7.0 7.2  HGB 9.1* 9.5* --  HCT 26.5* 27.1* --  MCV 90.4 90.0 --  PLT 172 113* --   Cardiac Enzymes: No results found for this basename: CKTOTAL:3,CKMB:3,CKMBINDEX:3,TROPONINI:3 in the last 168 hours BNP: No results found for this basename: PROBNP:3 in the last 168 hours D-Dimer: No results found for this  basename: DDIMER:2 in the last 168 hours CBG: No results found for this basename: GLUCAP:6 in the last 168 hours Hemoglobin A1C: No results found for this basename: HGBA1C in the last 168 hours Fasting Lipid Panel: No results found for this basename: CHOL,HDL,LDLCALC,TRIG,CHOLHDL,LDLDIRECT in the last 960 hours Thyroid Function Tests:  Lab 02/01/11 0516  TSH 3.128  T4TOTAL --  FREET4 --  T3FREE --  THYROIDAB --   Coagulation:  Lab 02/01/11 0516  LABPROT 15.8*  INR 1.23    Micro Results: No results found for this or any previous visit (from the past 240 hour(s)). Studies/Results: No results found. Scheduled Meds:    . tenormin  12.5 mg Oral QHS  . budesonide-formoterol  2 puff Inhalation BID  . citalopram  20 mg Oral QHS  . enoxaparin (LOVENOX) injection  40 mg Subcutaneous Q24H  . omega-3 acid ethyl esters  1 g Oral QHS  . polyethylene glycol  17 g Oral Daily  . senna-docusate  1 tablet Oral BID  . simvastatin  20 mg Oral Daily  . vancomycin  1,000 mg Intravenous Once  . DISCONTD: tenormin  25 mg  Oral QHS   Continuous Infusions:    . 0.9 % NaCl with KCl 20 mEq / L 950 mL (02/04/11 1037)   PRN Meds:.acetaminophen, acetaminophen, albuterol, bisacodyl, diphenhydrAMINE, diphenhydrAMINE, magnesium hydroxide, methocarbamol, naloxone, ondansetron (ZOFRAN) IV, oxyCODONE-acetaminophen, promethazine, sodium chloride, traZODone Assessment/Plan: Active Problems:  Eosinophilia  COPD (chronic obstructive pulmonary disease)  Hip fracture, intertrochanteric  Wrist fracture  Thrombocytopenia  Anemia  Hyponatremia  Depression  Confusion  Bradycardia Status post open reduction internal fixation left distal femur fracture, by Dr. Hilda Lias on 02/01/2011    His mild confusion was likely secondary to the hydromorphone PCA pump and is now resolved with the discontinuation of the PCA pump. As needed Percocet has been ordered. He is bradycardic, but asymptomatic. It is secondary  to atenolol. The dose of atenolol was decreased yesterday.  The patient is followed by Dr. Jodene Nam for chronic thrombocytopenia. His platelet count has been waxing and waning, but today, it is within normal limits. He has blood loss anemia and was transfused one unit of packed red blood cells postoperatively. His hemoglobin has decreased 2 g since the transfusion but has been stable over the last 24-48 hours.. His serum sodium has normalized. His COPD is stable. Celexa was restarted for treatment of chronic depression. He is postoperative day #5. He is progressing well per my conversation with the occupational therapist this morning.  Insurance approval for inpatient rehabilitation at [facility] is pending. Hopefully, he will be approved and discharged tomorrow.   LOS: 6 days   Tarissa Kerin 02/06/2011, 10:09 AM

## 2011-02-06 NOTE — Progress Notes (Signed)
Subjective: 5 Days Post-Op Procedure(s) (LRB): COMPRESSION HIP (Left) OPEN REDUCTION INTERNAL FIXATION (ORIF) DISTAL FEMUR FRACTURE (Left) Patient reports pain as 3 on 0-10 scale.    Objective: Vital signs in last 24 hours: Temp:  [97.7 F (36.5 C)-98.2 F (36.8 C)] 98.2 F (36.8 C) (01/21 0534) Pulse Rate:  [55-72] 55  (01/21 0534) Resp:  [18-20] 18  (01/21 0534) BP: (114-132)/(66-89) 116/66 mmHg (01/21 0534) SpO2:  [90 %-93 %] 93 % (01/21 0534) FiO2 (%):  [99 %] 99 % (01/21 0817)  Intake/Output from previous day: 01/20 0701 - 01/21 0700 In: 240 [P.O.:240] Out: -  Intake/Output this shift: Total I/O In: 10 [P.O.:10] Out: -    Basename 02/06/11 0448 02/04/11 0634  HGB 9.1* 9.5*    Basename 02/06/11 0448 02/04/11 0634  WBC 5.1 9.0  RBC 2.93* 3.01*  HCT 26.5* 27.1*  PLT 172 113*    Basename 02/05/11 0610 02/04/11 0634  NA 135 132*  K 3.9 4.2  CL 100 99  CO2 26 26  BUN 17 13  CREATININE 0.89 0.92  GLUCOSE 97 110*  CALCIUM 9.1 8.7   No results found for this basename: LABPT:2,INR:2 in the last 72 hours  Neurologically intact Neurovascular intact Sensation intact distally Intact pulses distally Dorsiflexion/Plantar flexion intact Incision: no drainage No cellulitis present  Assessment/Plan: 5 Days Post-Op Procedure(s) (LRB): COMPRESSION HIP (Left) OPEN REDUCTION INTERNAL FIXATION (ORIF) DISTAL FEMUR FRACTURE (Left) Up with therapy  Bruce [name] 02/06/2011, 8:47 AM

## 2011-02-06 NOTE — Progress Notes (Signed)
Occupational Therapy Treatment Patient Details Name: Bruce [name] MRN: [id number] DOB: 08-18-1945 Today's Date: 02/06/2011 Patient seen from 900-930  ADL's  910-930 20' Therapeutic Exercise 900-910 OT Assessment/Plan  Patient has met goal for ability to sit EOB for self grooming and has increased activity tolerance and motivation. Patient eager to get to rehab.  Patient now able to motor plan activity and perform mobility tasks requested and will be a good candidate for inpatient rehab. OT Goals ADL Goals ADL Goal: Grooming - Progress: Met Arm Goals Pt Will Tolerate PROM: to maintain range of motion;Left upper extremity;with caregiver independent in performing Arm Goal: PROM - Progress: Progressing toward goal Additional Arm Goal #2: Discharge goal.  OT Treatment Precautions/Restrictions  Precautions Precautions: Fall Precaution Comments: Hip  Required Braces or Orthoses: Yes Other Brace/Splint: Now has splint for wrist.  Restrictions Weight Bearing Restrictions: Yes LLE Weight Bearing: Touchdown weight bearing   ADL ADL Eating/Feeding: Simulated Where Assessed - Eating/Feeding: Edge of bed Grooming: Wash/dry face;Wash/dry hands;Supervision/safety;Set up Grooming Details (indicate cue type and reason): Applied deoderant Where Assessed - Grooming: Sitting, bed (Sitting EOB no support required) Mobility  Bed Mobility Bed Mobility: Yes Rolling Right: 4: Min assist Supine to Sit: 4: Min assist Sitting - Scoot to Edge of Bed: 5: Supervision Sit to Supine: 4: Min assist Sit to Supine - Details (indicate cue type and reason): Patient able to motor plan all activity much better. Scooting to [facility]: 5: Supervision Scooting to [facility] Details (indicate cue type and reason): No verbal cues required. Transfers Transfers: No Exercises Shoulder Exercises Shoulder Flexion: AAROM;Supine Shoulder ABduction: AAROM;Supine  End of Session OT - End of Session Equipment Utilized During  Treatment: Other (comment) (splint for wrist) Activity Tolerance: Patient tolerated treatment well Patient left: in bed;with call bell in reach;with family/visitor present General Behavior During Session: [facility] for tasks performed Cognition: [facility] for tasks performed  Lisa Roca OTR/L 02/06/2011, 11:41 AM

## 2011-02-06 NOTE — Progress Notes (Signed)
2115 upon assessment, pt complained of pain in the left leg and general discomfort which was likely due to position. Pt said that the pain felt like tightness in his leg. His left leg is swollen, and the mid upper thigh is reddened. Pedal pulse on the left side is easily assessed. Pedal pulse on the right side was much harder, I think that I was able to assess a faint pulse, but am uncertain. The color is good in both feet; pt complains of nothing with right leg, just tightness in the left leg. Sheryn Bison

## 2011-02-07 ENCOUNTER — Encounter (HOSPITAL_COMMUNITY): Payer: Self-pay | Admitting: Orthopaedic Surgery

## 2011-02-07 ENCOUNTER — Inpatient Hospital Stay (HOSPITAL_COMMUNITY)
Admission: RE | Admit: 2011-02-07 | Discharge: 2011-02-16 | DRG: 462 | Disposition: A | Discharge status: Home / Self Care | Payer: BC Managed Care – PPO | Source: Ambulatory Visit | Attending: Physical Medicine & Rehabilitation | Admitting: Physical Medicine & Rehabilitation

## 2011-02-07 DIAGNOSIS — J449 Chronic obstructive pulmonary disease, unspecified: Secondary | ICD-10-CM

## 2011-02-07 DIAGNOSIS — J4489 Other specified chronic obstructive pulmonary disease: Secondary | ICD-10-CM

## 2011-02-07 DIAGNOSIS — F3289 Other specified depressive episodes: Secondary | ICD-10-CM

## 2011-02-07 DIAGNOSIS — Z79899 Other long term (current) drug therapy: Secondary | ICD-10-CM

## 2011-02-07 DIAGNOSIS — S72309A Unspecified fracture of shaft of unspecified femur, initial encounter for closed fracture: Secondary | ICD-10-CM

## 2011-02-07 DIAGNOSIS — D62 Acute posthemorrhagic anemia: Secondary | ICD-10-CM

## 2011-02-07 DIAGNOSIS — S72143A Displaced intertrochanteric fracture of unspecified femur, initial encounter for closed fracture: Secondary | ICD-10-CM

## 2011-02-07 DIAGNOSIS — Y92009 Unspecified place in unspecified non-institutional (private) residence as the place of occurrence of the external cause: Secondary | ICD-10-CM

## 2011-02-07 DIAGNOSIS — Z7901 Long term (current) use of anticoagulants: Secondary | ICD-10-CM

## 2011-02-07 DIAGNOSIS — W11XXXA Fall on and from ladder, initial encounter: Secondary | ICD-10-CM

## 2011-02-07 DIAGNOSIS — F17200 Nicotine dependence, unspecified, uncomplicated: Secondary | ICD-10-CM

## 2011-02-07 DIAGNOSIS — S62113A Displaced fracture of triquetrum [cuneiform] bone, unspecified wrist, initial encounter for closed fracture: Secondary | ICD-10-CM

## 2011-02-07 DIAGNOSIS — E785 Hyperlipidemia, unspecified: Secondary | ICD-10-CM

## 2011-02-07 DIAGNOSIS — Z880 Allergy status to penicillin: Secondary | ICD-10-CM

## 2011-02-07 DIAGNOSIS — S7222XA Displaced subtrochanteric fracture of left femur, initial encounter for closed fracture: Secondary | ICD-10-CM

## 2011-02-07 DIAGNOSIS — Z5189 Encounter for other specified aftercare: Principal | ICD-10-CM

## 2011-02-07 DIAGNOSIS — D696 Thrombocytopenia, unspecified: Secondary | ICD-10-CM

## 2011-02-07 DIAGNOSIS — S62109A Fracture of unspecified carpal bone, unspecified wrist, initial encounter for closed fracture: Secondary | ICD-10-CM

## 2011-02-07 DIAGNOSIS — M109 Gout, unspecified: Secondary | ICD-10-CM

## 2011-02-07 DIAGNOSIS — F329 Major depressive disorder, single episode, unspecified: Secondary | ICD-10-CM

## 2011-02-07 DIAGNOSIS — S62102A Fracture of unspecified carpal bone, left wrist, initial encounter for closed fracture: Secondary | ICD-10-CM

## 2011-02-07 DIAGNOSIS — E669 Obesity, unspecified: Secondary | ICD-10-CM

## 2011-02-07 DIAGNOSIS — Y93H3 Activity, building and construction: Secondary | ICD-10-CM

## 2011-02-07 DIAGNOSIS — F1010 Alcohol abuse, uncomplicated: Secondary | ICD-10-CM

## 2011-02-07 DIAGNOSIS — I498 Other specified cardiac arrhythmias: Secondary | ICD-10-CM

## 2011-02-07 DIAGNOSIS — E871 Hypo-osmolality and hyponatremia: Secondary | ICD-10-CM

## 2011-02-07 LAB — URINALYSIS, ROUTINE W REFLEX MICROSCOPIC
Glucose, UA: NEGATIVE mg/dL
Ketones, ur: NEGATIVE mg/dL
Leukocytes, UA: NEGATIVE
Protein, ur: NEGATIVE mg/dL
Urobilinogen, UA: 4 mg/dL — ABNORMAL HIGH (ref 0.0–1.0)

## 2011-02-07 LAB — URINE CULTURE

## 2011-02-07 MED ORDER — POLYSACCHARIDE IRON 150 MG PO CAPS
150.0000 mg | ORAL_CAPSULE | Freq: Two times a day (BID) | ORAL | Status: DC
  Administered 2011-02-07 – 2011-02-15 (×16): 150 mg via ORAL
  Filled 2011-02-07 (×20): qty 1

## 2011-02-07 MED ORDER — ACETAMINOPHEN 650 MG RE SUPP: 650.0000 mg | RECTAL | Status: DC | PRN

## 2011-02-07 MED ORDER — TRAZODONE HCL 50 MG PO TABS: 25.0000 mg | ORAL_TABLET | Freq: Every evening | ORAL | Status: DC | PRN

## 2011-02-07 MED ORDER — BUDESONIDE-FORMOTEROL FUMARATE 80-4.5 MCG/ACT IN AERO
2.0000 | INHALATION_SPRAY | Freq: Two times a day (BID) | RESPIRATORY_TRACT | Status: DC
  Administered 2011-02-07 – 2011-02-16 (×14): 2 via RESPIRATORY_TRACT
  Filled 2011-02-07 (×4): qty 6.9

## 2011-02-07 MED ORDER — SENNOSIDES-DOCUSATE SODIUM 8.6-50 MG PO TABS
1.0000 | ORAL_TABLET | Freq: Two times a day (BID) | ORAL | Status: DC
  Administered 2011-02-07 – 2011-02-09 (×4): 1 via ORAL
  Filled 2011-02-07 (×3): qty 1

## 2011-02-07 MED ORDER — ATENOLOL 12.5 MG HALF TABLET
12.5000 mg | ORAL_TABLET | Freq: Every day | ORAL | Status: DC
  Administered 2011-02-07 – 2011-02-15 (×6): 12.5 mg via ORAL
  Filled 2011-02-07 (×11): qty 1

## 2011-02-07 MED ORDER — BISACODYL 10 MG RE SUPP: 10.0000 mg | Freq: Every day | RECTAL | Status: DC | PRN

## 2011-02-07 MED ORDER — FLEET ENEMA 7-19 GM/118ML RE ENEM: 1.0000 | ENEMA | Freq: Once | RECTAL | Status: AC | PRN

## 2011-02-07 MED ORDER — MAGNESIUM HYDROXIDE 400 MG/5ML PO SUSP: 30.0000 mL | Freq: Every day | ORAL | Status: DC | PRN

## 2011-02-07 MED ORDER — GUAIFENESIN-DM 100-10 MG/5ML PO SYRP: 5.0000 mL | ORAL_SOLUTION | Freq: Four times a day (QID) | ORAL | Status: DC | PRN

## 2011-02-07 MED ORDER — ALBUTEROL SULFATE (5 MG/ML) 0.5% IN NEBU: 2.5000 mg | INHALATION_SOLUTION | RESPIRATORY_TRACT | Status: DC | PRN

## 2011-02-07 MED ORDER — ONDANSETRON HCL 4 MG/2ML IJ SOLN: 4.0000 mg | Freq: Four times a day (QID) | INTRAMUSCULAR | Status: DC | PRN

## 2011-02-07 MED ORDER — TRAMADOL HCL 50 MG PO TABS
50.0000 mg | ORAL_TABLET | Freq: Four times a day (QID) | ORAL | Status: DC | PRN
  Administered 2011-02-12: 50 mg via ORAL
  Filled 2011-02-07: qty 1

## 2011-02-07 MED ORDER — OMEGA-3-ACID ETHYL ESTERS 1 G PO CAPS
1.0000 g | ORAL_CAPSULE | Freq: Every day | ORAL | Status: DC
  Administered 2011-02-07 – 2011-02-15 (×9): 1 g via ORAL
  Filled 2011-02-07 (×11): qty 1

## 2011-02-07 MED ORDER — OXYCODONE-ACETAMINOPHEN 5-325 MG PO TABS
1.0000 | ORAL_TABLET | ORAL | Status: DC | PRN
  Administered 2011-02-07 (×2): 2 via ORAL
  Administered 2011-02-08: 1 via ORAL
  Administered 2011-02-08 (×2): 2 via ORAL
  Administered 2011-02-08 – 2011-02-09 (×3): 1 via ORAL
  Administered 2011-02-10 – 2011-02-11 (×4): 2 via ORAL
  Administered 2011-02-11: 1 via ORAL
  Administered 2011-02-11: 2 via ORAL
  Administered 2011-02-11: 1 via ORAL
  Administered 2011-02-12 (×2): 2 via ORAL
  Administered 2011-02-13: 1 via ORAL
  Administered 2011-02-13: 2 via ORAL
  Administered 2011-02-13: 1 via ORAL
  Administered 2011-02-14 (×2): 2 via ORAL
  Administered 2011-02-15: 1 via ORAL
  Administered 2011-02-15: 2 via ORAL
  Administered 2011-02-15: 1 via ORAL
  Administered 2011-02-16: 2 via ORAL
  Filled 2011-02-07 (×11): qty 2
  Filled 2011-02-07: qty 1
  Filled 2011-02-07: qty 2
  Filled 2011-02-07 (×5): qty 1
  Filled 2011-02-07 (×2): qty 2
  Filled 2011-02-07: qty 1
  Filled 2011-02-07 (×3): qty 2
  Filled 2011-02-07: qty 1
  Filled 2011-02-07 (×2): qty 2

## 2011-02-07 MED ORDER — POLYETHYLENE GLYCOL 3350 17 G PO PACK
17.0000 g | PACK | Freq: Every day | ORAL | Status: DC
  Administered 2011-02-08 – 2011-02-16 (×9): 17 g via ORAL
  Filled 2011-02-07 (×11): qty 1

## 2011-02-07 MED ORDER — ENOXAPARIN SODIUM 40 MG/0.4ML ~~LOC~~ SOLN
40.0000 mg | Freq: Every day | SUBCUTANEOUS | Status: DC
  Administered 2011-02-07 – 2011-02-16 (×10): 40 mg via SUBCUTANEOUS
  Filled 2011-02-07 (×15): qty 0.4

## 2011-02-07 MED ORDER — ACETAMINOPHEN 325 MG PO TABS
650.0000 mg | ORAL_TABLET | ORAL | Status: DC | PRN
  Administered 2011-02-11: 650 mg via ORAL
  Administered 2011-02-13: 325 mg via ORAL
  Administered 2011-02-14 – 2011-02-15 (×2): 650 mg via ORAL
  Filled 2011-02-07 (×4): qty 2

## 2011-02-07 MED ORDER — DIPHENHYDRAMINE HCL 12.5 MG/5ML PO ELIX: 12.5000 mg | ORAL_SOLUTION | Freq: Four times a day (QID) | ORAL | Status: DC | PRN

## 2011-02-07 MED ORDER — METHOCARBAMOL 500 MG PO TABS
500.0000 mg | ORAL_TABLET | Freq: Four times a day (QID) | ORAL | Status: DC | PRN
  Administered 2011-02-07 – 2011-02-15 (×11): 500 mg via ORAL
  Filled 2011-02-07 (×11): qty 1

## 2011-02-07 MED ORDER — CITALOPRAM HYDROBROMIDE 20 MG PO TABS
20.0000 mg | ORAL_TABLET | Freq: Every day | ORAL | Status: DC
  Administered 2011-02-07 – 2011-02-15 (×9): 20 mg via ORAL
  Filled 2011-02-07 (×11): qty 1

## 2011-02-07 MED ORDER — SIMVASTATIN 20 MG PO TABS
20.0000 mg | ORAL_TABLET | Freq: Every day | ORAL | Status: DC
  Administered 2011-02-08 – 2011-02-15 (×8): 20 mg via ORAL
  Filled 2011-02-07 (×9): qty 1

## 2011-02-07 MED ORDER — ALUM & MAG HYDROXIDE-SIMETH 200-200-20 MG/5ML PO SUSP: 30.0000 mL | ORAL | Status: DC | PRN

## 2011-02-07 NOTE — Progress Notes (Signed)
Contacted [employer] this am to inquire on decision regarding CIR. Case manager from [place] did receive all clinicals from Friday and states will be making decision regarding CIR within the hour. Do have bed availability at [facility] today. Marina Goodell admissions coordinator [phone].

## 2011-02-07 NOTE — Progress Notes (Signed)
Notified by Ralph Dowdy rn at [facility] that pts insurance has approved pt stay at inpt rehab. Bed is available today. Notified patient, wife, and Dr. Lendell Caprice. Arrangements completed for transport  inpt rehab this am. Patient, wife and md agrees.

## 2011-02-07 NOTE — Progress Notes (Signed)
Discussed with Dr. Sherrie Mustache, and care management. Patient has been accepted at inpatient rehabilitation. No new problems reported. Orders written for transfer. Cobra signed

## 2011-02-07 NOTE — H&P (Signed)
Physical Medicine and Rehabilitation Admission H&P     Chief complaint: : HPI: Bruce [name] is an 66 y.o. obese Caucasian male with mild COPD with occasional use of albuterol inhalers; mild thrombocytopenia being evaluated by Dr. 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 Admitted to [facility] on 01/15 past missing a step while up on a ladder repairing his home, with fell off a ladder sustaining injury to his left wrist and his left hip. Fall from the ladder was not associated with any dizziness palpitation shortness of breath chest pain nausea or vomiting. He was brought to the emergency room where x-rays revealed possible small avulsion fracture of triquetrum and acute displaced fracture of proximal left  IT femur and fracture of lateral femoral shaft. Evaluated by Dr. Hilda Lias and patient underwent ORIF left hip and left proximal shaft of femur on 01/16 Left wrist splinted. Post op TDWB on LLE and NWB left wrist.On SQ lovenox for DVT prophylaxis for 4 weeks total per ortho. Left wrist splint changed to cock-up splint on 01/20. ABLA treated with 1 unit PRBC with last hgb @9 .1.  Hyponatremia treated with gentle hydration.  Patient did have some issues with confusion likely due to PCA. He had some issues with bradycardia and atenolol dose decreased.   Review of Systems  Musculoskeletal: Positive for joint pain (left wrist and hip).  All other systems reviewed and are negative.   Past Medical History  Diagnosis Date  . Eosinophilia   . Thrombocytopenia   . COPD (chronic obstructive pulmonary disease)   . Obesity   . Degenerative joint disease   . Depression   . Gout   . Anemia 02/02/2011  . Hyperlipidemia   . Hip fracture, left January 2013.    Status post ORIF   Past Surgical History  Procedure Date  . Multiple tooth extractions   . Left hip orif January 2013.    Dr. Hilda Lias.  . Compression hip screw 02/01/2011    Procedure: COMPRESSION HIP;  Surgeon: Darreld Mclean, MD;  Location: [facility];  Service: Orthopedics;  Laterality: Left;  . Orif femur fracture 02/01/2011    Procedure: OPEN REDUCTION INTERNAL FIXATION (ORIF) DISTAL FEMUR FRACTURE;  Surgeon: Darreld Mclean, MD;  Location: [facility];  Service: Orthopedics;  Laterality: Left;   Family History  Problem Relation Age of Onset  . Cancer Mother   . Emphysema Father   . Cancer Sister   . Cancer Brother    Social History:  reports that he has quit smoking. His smokeless tobacco use includes Chew. He reports that he drinks alcohol. He reports that he does not use illicit drugs.   Allergies  Allergen Reactions  . Penicillins    Medications Prior to Admission  Medication Dose Route Frequency Provider Last Rate Last Dose  . 0.9 % NaCl with KCl 20 mEq/ L  infusion   Intravenous Continuous Elliot Cousin, MD 10 mL/hr at 02/04/11 1037 950 mL at 02/04/11 1037  . acetaminophen (TYLENOL) tablet 650 mg  650 mg Oral Q4H PRN Vania Rea, MD   650 mg at 02/05/11 2112   Or  . acetaminophen (TYLENOL) suppository 650 mg  650 mg Rectal Q4H PRN Vania Rea, MD      . albuterol (PROVENTIL) (5 MG/ML) 0.5% nebulizer solution 2.5 mg  2.5 mg Nebulization Q4H PRN Vania Rea, MD      . atenolol (TENORMIN) tablet 12.5 mg  12.5 mg Oral QHS Elliot Cousin, MD   12.5  mg at 02/06/11 2200  . bisacodyl (DULCOLAX) suppository 10 mg  10 mg Rectal Daily PRN Vania Rea, MD      . budesonide-formoterol [facility]) 80-4.5 MCG/ACT inhaler 2 puff  2 puff Inhalation BID Vania Rea, MD   2 puff at 02/07/11 0730  . citalopram (CELEXA) tablet 20 mg  20 mg Oral QHS Elliot Cousin, MD   20 mg at 02/06/11 2200  . diphenhydrAMINE (BENADRYL) injection 12.5 mg  12.5 mg Intravenous Q6H PRN Darreld Mclean, MD       Or  . diphenhydrAMINE (BENADRYL) 12.5 MG/5ML elixir 12.5 mg  12.5 mg Oral Q6H PRN Darreld Mclean, MD   12.5 mg at 02/02/11 2107  . enoxaparin (LOVENOX) injection 40 mg  40 mg Subcutaneous Q24H Darreld Mclean, MD    40 mg at 02/06/11 1652  . magnesium hydroxide (MILK OF MAGNESIA) suspension 30 mL  30 mL Oral Daily PRN Darreld Mclean, MD      . methocarbamol (ROBAXIN) tablet 500 mg  500 mg Oral Q6H PRN Elliot Cousin, MD   500 mg at 02/07/11 1020  . naloxone [facility]) injection 0.4 mg  0.4 mg Intravenous PRN Darreld Mclean, MD       And  . sodium chloride 0.9 % injection 9 mL  9 mL Intravenous PRN Darreld Mclean, MD      . omega-3 acid ethyl esters (LOVAZA) capsule 1 g  1 g Oral QHS Vania Rea, MD   1 g at 02/06/11 2200  . ondansetron (ZOFRAN) injection 4 mg  4 mg Intravenous Q6H PRN Darreld Mclean, MD      . oxyCODONE-acetaminophen (PERCOCET) 5-325 MG per tablet 1-2 tablet  1-2 tablet Oral Q4H PRN Elliot Cousin, MD   2 tablet at 02/07/11 1020  . polyethylene glycol (MIRALAX / GLYCOLAX) packet 17 g  17 g Oral Daily Elliot Cousin, MD   17 g at 02/07/11 4098  . promethazine (PHENERGAN) injection 12.5 mg  12.5 mg Intramuscular Q4H PRN Darreld Mclean, MD      . senna-docusate (Senokot-S) tablet 1 tablet  1 tablet Oral BID Elliot Cousin, MD   1 tablet at 02/07/11 [phone]  . simvastatin (ZOCOR) tablet 20 mg  20 mg Oral Daily Vania Rea, MD   20 mg at 02/07/11 0923  . traZODone (DESYREL) tablet 25 mg  25 mg Oral QHS PRN Vania Rea, MD   25 mg at 02/07/11 0230  . vancomycin (VANCOCIN) IVPB 1000 mg/200 mL premix  1,000 mg Intravenous Once Francisco J Valls, PHARMD       Medications Prior to Admission  Medication Sig Dispense Refill  . atorvastatin (LIPITOR) 10 MG tablet Take 10 mg by mouth at bedtime.       . budesonide-formoterol (SYMBICORT) 80-4.5 MCG/ACT inhaler Inhale 2 puffs into the lungs 2 (two) times daily.        . citalopram (CELEXA) 20 MG tablet TAKE ONE TABLET BY MOUTH EVERY DAY  30 tablet  3  . fish oil-omega-3 fatty acids 1000 MG capsule Take 1,000 mg by mouth at bedtime.         Home:  one level home. One step at entry.   Functional History:  Independent PTA.  Works   Functional  Status:  Mobility:          ADL:    Cognition:       There were no vitals taken for this visit.   Marland KitchenPhysical Exam  Constitutional: He is oriented to person, place, and time and well-developed,  well-nourished, and in no distress.  HENT:  Head: Normocephalic.  Eyes: Conjunctivae and EOM are normal. Pupils are equal, round, and reactive to light.  Neck: Normal range of motion.  Cardiovascular: Normal rate.   Pulmonary/Chest: Effort normal.  Abdominal: Soft.  Musculoskeletal:       Left hip appropriately tender.  Left wrist in splint with no palpable tenderness along metcarpals or carpals  Lymphadenopathy:       1+ edema left lower extremity  Neurological: He is alert and oriented to person, place, and time. No cranial nerve deficit or sensory deficit.  Reflex Scores:      Tricep reflexes are 2+ on the right side and 2+ on the left side.      Bicep reflexes are 2+ on the right side and 2+ on the left side.      Brachioradialis reflexes are 2+ on the right side and 2+ on the left side.      Patellar reflexes are 2+ on the right side and 2+ on the left side.      Achilles reflexes are 2+ on the right side and 2+ on the left side.      Pt is appropriate cognitively. UE strength grossly 4 to 5/5.  LLE 1-2 prox to 4+ distally.  RLE is 4 to 5/5.   Skin:       Left hip with staples and minimal serous drainage    Results for orders placed during the hospital encounter of 01/31/11 (from the past 48 hour(s))  CBC     Status: Abnormal   Collection Time   02/06/11  4:48 AM      Component Value Range Comment   WBC 5.1  4.0 - 10.5 (K/uL)    RBC 2.93 (*) 4.22 - 5.81 (MIL/uL)    Hemoglobin 9.1 (*) 13.0 - 17.0 (g/dL)    HCT 16.1 (*) 09.6 - 52.0 (%)    MCV 90.4  78.0 - 100.0 (fL)    MCH 31.1  26.0 - 34.0 (pg)    MCHC 34.3  30.0 - 36.0 (g/dL)    RDW 04.5  40.9 - 81.1 (%)    Platelets 172  150 - 400 (K/uL) DELTA CHECK NOTED   No results found.  Post Admission Physician  Evaluation: 1. Functional deficits secondary  to left femoral shaft, IT fx's/ questionable Left triquetrum fracture 2. Patient is admitted to receive collaborative, interdisciplinary care between the physiatrist, rehab nursing staff, and therapy team. 3. Patient's level of medical complexity and substantial therapy needs in context of that medical necessity cannot be provided at a lesser intensity of care such as a SNF. 4. Patient has experienced substantial functional loss from his/her baseline which was documented above under the "Functional History" and "Functional Status" headings.  Judging by the patient's diagnosis, physical exam, and functional history, the patient has potential for functional progress which will result in measurable gains while on inpatient rehab.  These gains will be of substantial and practical use upon discharge  in facilitating mobility and self-care at the household level. 5. Physiatrist will provide 24 hour management of medical needs as well as oversight of the therapy plan/treatment and provide guidance as appropriate regarding the interaction of the two. 6. 24 hour rehab nursing will assist with bladder management, bowel management, safety, skin/wound care, medication administration, pain management and patient education  and help integrate therapy concepts, techniques,education, etc. 7. PT will assess and treat for:  LES, weight bearing precautions, fxnl mobility, balance, pain  mgt.  Goals are: modified independent to supervision (perhaps minimal assist with stairs). 8. OT will assess and treat for: UES, fxnl mobilitye, ADL's.   Goals are: modified independent to minima assist. 9. SLP will assess and treat for: n/a. 10. Case Management and Social Worker will assess and treat for psychological issues and discharge planning. 11. Team conference will be held weekly to assess progress toward goals and to determine barriers to discharge. 12.  Patient will receive at least 3  hours of therapy per day at least 5 days per week. 13. ELOS and Prognosis: 1-2 weeks excellent   Medical Problem List and Plan: 1. DVT Prophylaxis/Anticoagulation: Pharmaceutical: Lovenox for 4 weeks post op  2. Pain Management: monitor on prn percocet and robaxin.  Monitor for sedation or confusion. May need scheduled meds  3. Mood: continue celexa.  4. ABLA:  Add iron supplement for now.  5. COPD: continue symbicort bid with albuterol prn SOB.  6. Bradycardia: monitor with bid checks.  Heart rate still in the 50s.  May need atenolol discontinued.  Will set parameters for medication.  He denies symptoms at present  7. Mild thrombocytopenia:  Platelets normalized.  Monitor as now on lovenox.  8. Ortho- nwb thrug left leg and left wrist.   Jacquelynn Cree 02/07/2011, 11:04 AM

## 2011-02-07 NOTE — Progress Notes (Signed)
Per CM, pt has bed at [facility] for today.  CSW to sign off as d/c plan is appropriate and family is agreeable.  Bruce [name]

## 2011-02-07 NOTE — Progress Notes (Addendum)
Pain medication given Robaxin and Percocet @1021  prior to transfer to [facility].  Called report to [facility] rehab and carelink. Awaiting arrival of [facility]link. Patient waiting patiently. IV cath removed prior to transfer due to outdated. IV cath intact and patient tolerates well.

## 2011-02-07 NOTE — Progress Notes (Signed)
Subjective: 6 Days Post-Op Procedure(s) (LRB): COMPRESSION HIP (Left) OPEN REDUCTION INTERNAL FIXATION (ORIF) DISTAL FEMUR FRACTURE (Left) Patient reports pain as 2 on 0-10 scale.    Objective: Vital signs in last 24 hours: Temp:  [97.4 F (36.3 C)-97.8 F (36.6 C)] 97.8 F (36.6 C) (01/22 0516) Pulse Rate:  [51-87] 52  (01/22 0516) Resp:  [18-20] 20  (01/22 0516) BP: (117-133)/(71-76) 133/75 mmHg (01/22 0516) SpO2:  [95 %-96 %] 95 % (01/22 0142) FiO2 (%):  [99 %] 99 % (01/21 0817) Weight:  [122.562 kg (270 lb 3.2 oz)] 122.562 kg (270 lb 3.2 oz) (01/22 0516)  Intake/Output from previous day: 01/21 0701 - 01/22 0700 In: 670 [P.O.:670] Out: 1125 [Urine:1125] Intake/Output this shift:     Basename 02/06/11 0448  HGB 9.1*    Basename 02/06/11 0448  WBC 5.1  RBC 2.93*  HCT 26.5*  PLT 172    Basename 02/05/11 0610  NA 135  K 3.9  CL 100  CO2 26  BUN 17  CREATININE 0.89  GLUCOSE 97  CALCIUM 9.1   No results found for this basename: LABPT:2,INR:2 in the last 72 hours  Neurologically intact Neurovascular intact Sensation intact distally Intact pulses distally Dorsiflexion/Plantar flexion intact Incision: no drainage No cellulitis present Compartment soft  Assessment/Plan: 6 Days Post-Op Procedure(s) (LRB): COMPRESSION HIP (Left) OPEN REDUCTION INTERNAL FIXATION (ORIF) DISTAL FEMUR FRACTURE (Left) Hopefully able to discharge to rehab unit today.  He will need to have sutures removed this Friday or Saturday and Steri-strip wound.  He will need elevated toilet seat or bedside commode.  He will need platform walker.  He is to have toe-touch on the left only.  No other weight bearing at this time.  He should have continued enoxaparin for three more weeks, subcutaneously.  I will need to see after discharge from rehab unit or in three weeks with x-rays of the femur on the left.  He is to continue incentive spirometry while more bed confined on a regular  basis every day.  Yuleimy Kretz 02/07/2011, 7:03 AM

## 2011-02-08 DIAGNOSIS — Z5189 Encounter for other specified aftercare: Secondary | ICD-10-CM

## 2011-02-08 DIAGNOSIS — S62109A Fracture of unspecified carpal bone, unspecified wrist, initial encounter for closed fracture: Secondary | ICD-10-CM

## 2011-02-08 DIAGNOSIS — S72309A Unspecified fracture of shaft of unspecified femur, initial encounter for closed fracture: Secondary | ICD-10-CM

## 2011-02-08 DIAGNOSIS — S72143A Displaced intertrochanteric fracture of unspecified femur, initial encounter for closed fracture: Secondary | ICD-10-CM

## 2011-02-08 DIAGNOSIS — W11XXXA Fall on and from ladder, initial encounter: Secondary | ICD-10-CM

## 2011-02-08 LAB — COMPREHENSIVE METABOLIC PANEL
ALT: 44 U/L (ref 0–53)
AST: 50 U/L — ABNORMAL HIGH (ref 0–37)
Alkaline Phosphatase: 116 U/L (ref 39–117)
CO2: 26 mEq/L (ref 19–32)
Calcium: 8.9 mg/dL (ref 8.4–10.5)
GFR calc non Af Amer: 86 mL/min — ABNORMAL LOW (ref >90)
Potassium: 4.1 mEq/L (ref 3.5–5.1)
Sodium: 135 mEq/L (ref 135–145)
Total Protein: 6.1 g/dL (ref 6.0–8.3)

## 2011-02-08 LAB — CBC
HCT: 28.6 % — ABNORMAL LOW (ref 39.0–52.0)
Hemoglobin: 9.7 g/dL — ABNORMAL LOW (ref 13.0–17.0)
MCH: 30.5 pg (ref 26.0–34.0)
MCHC: 33.9 g/dL (ref 30.0–36.0)
RDW: 12.7 % (ref 11.5–15.5)

## 2011-02-08 LAB — DIFFERENTIAL
Basophils Relative: 1 % (ref 0–1)
Eosinophils Absolute: 0.8 10*3/uL — ABNORMAL HIGH (ref 0.0–0.7)
Monocytes Absolute: 0.5 10*3/uL (ref 0.1–1.0)
Monocytes Relative: 7 % (ref 3–12)

## 2011-02-08 NOTE — Progress Notes (Signed)
Recreational Therapy Assessment and Plan  Patient Details  Name: Bruce [name] MRN: [id number] Date of Birth: 15-May-1945  Rehab Potential: Good ELOS: 10 days   Assessment Clinical Impression: Patient is a 65 y.o. year old male with recent admission to the hospital on 01/15 after missing a step while up on a ladder repairing his home, with fell off a ladder sustaining injury to his left wrist and his left hip. Fall from the ladder was not associated with any dizziness palpitation shortness of breath chest pain nausea or vomiting. He was brought to the emergency room where x-rays revealed possible small avulsion fracture of triquetrum and acute displaced fracture of proximal left IT femur and fracture of lateral femoral shaft. Pt with mild COPD with occasional use of albuterol inhalers; mild thrombocytopenia being evaluated by Dr. 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 Evaluated by Dr. Hilda Lias and patient underwent ORIF left hip and left proximal shaft of femur on 01/16 Left wrist splinted. Post op TDWB on LLE and NWB left wrist.On SQ lovenox for DVT prophylaxis for 4 weeks total per ortho. Left wrist splint changed to cock-up splint on 01/20. ABLA treated with 1 unit PRBC with last hgb @9 .1. Hyponatremia treated with gentle hydration. Patient did have some issues with confusion likely due to PCA. He had some issues with bradycardia and atenolol dose decreased. Patient transferred to CIR on 02/07/2011 .   Pt presents with decreased activity tolerance, decreased functional mobility, decreased balance, increased pain limiting pt's independence with leisure/community pursuits.   Recreational Therapy Leisure History/Participation Premorbid leisure interest/current participation: Ashby Dawes - Lawn care;Crafts - Woodworking;Community - Grocery store;Community - Press photographer - Travel (Comment) (mr fix it, handyman, Geologist, engineering, woodworking,construction) Other Leisure  Interests: Television Leisure Participation Style: With Family/Friends;Alone Awareness of Community Resources: Multimedia programmer Appropriate for Education?: Yes Patient Agreeable to [employer]?: Yes Social interaction - Mood/Behavior: Cooperative Recreational Therapy Orientation Orientation -Reviewed with patient: Available activity resources Strengths/Weaknesses Patient Strengths/Abilities: Willingness to participate;Active premorbidly Patient weaknesses: Physical limitations  Plan Rec Therapy Plan Is patient appropriate for Therapeutic Recreation?: Yes Rehab Potential: Good Treatment times per week: min 1 time per week >20 minutes Estimated Length of Stay: 10 days Therapy Goals Achieved By:: Recreation/leisure participation;Community reintegration/education;1:1 session;Adaptive equipment instruction;Group participation (Comment);Patient/family education  Recommendations for other services: None  Discharge Criteria: Patient will be discharged from TR if patient refuses treatment 3 consecutive times without medical reason.  If treatment goals not met, if there is a change in medical status, if patient makes no progress towards goals or if patient is discharged from hospital.  The above assessment, treatment plan, treatment alternatives and goals were discussed and mutually agreed upon: by patient  Bruce [name] 02/08/2011, 5:15 PM

## 2011-02-08 NOTE — Progress Notes (Signed)
Patient information reviewed and entered into [facility] system by Mancil Pfenning, RN, CRRN, PPS Coordinator.  Information including medical coding and functional independence measure will be reviewed and updated through discharge.    

## 2011-02-08 NOTE — Progress Notes (Signed)
Social Work Assessment and Plan Assessment and Plan  Patient Name: Bruce [name]  [id number] Date: 02/08/2011  Problem List:  Patient Active Problem List  Diagnoses  . GOUT, UNSPECIFIED  . Eosinophilia  . ALLERGIC RHINITIS, SEASONAL  . RECTAL BLEEDING  . ARTHRITIS  . COPD (chronic obstructive pulmonary disease)  . Hip fracture, intertrochanteric  . Wrist fracture  . Thrombocytopenia  . Anemia  . Hyponatremia  . Depression  . Confusion  . Bradycardia  . Closed left subtrochanteric femur fracture  . Wrist fracture, left    Past Medical History:  Past Medical History  Diagnosis Date  . Eosinophilia   . Thrombocytopenia   . COPD (chronic obstructive pulmonary disease)   . Obesity   . Degenerative joint disease   . Depression   . Gout   . Anemia 02/02/2011  . Hyperlipidemia   . Hip fracture, left January 2013.    Status post ORIF    Past Surgical History:  Past Surgical History  Procedure Date  . Multiple tooth extractions   . Left hip orif January 2013.    Dr. Hilda Lias.  . Compression hip screw 02/01/2011    Procedure: COMPRESSION HIP;  Surgeon: Darreld Mclean, MD;  Location: [facility];  Service: Orthopedics;  Laterality: Left;  . Orif femur fracture 02/01/2011    Procedure: OPEN REDUCTION INTERNAL FIXATION (ORIF) DISTAL FEMUR FRACTURE;  Surgeon: Darreld Mclean, MD;  Location: [facility];  Service: Orthopedics;  Laterality: Left;    Discharge Planning  Discharge Planning Living Arrangements: Spouse/significant other Support Systems: Spouse/significant other;Children;Church/faith community;Friends/neighbors Assistance Needed: May require assistance at discharge Do you have any problems obtaining your medications?: No Type of Residence: Private residence Patient expects to be discharged to:: home Case Management Consult Needed: Yes (Comment) (RNCM- already following)  Social/Family/Support Systems Social/Family/Support Systems Patient Roles: Spouse;Parent;Other  (Comment) (Employee) Contact Information: Bruce [name] Ability/Limitations of Caregiver: Wife can assist Caregiver Availability: 24/7  Employment Status Employment Status Employment Status: Employed Return to Work Plans: Plans to retrun when able Fish farm manager Issues: No issues Guardian/Conservator: None  Abuse/Neglect Abuse/Neglect Assessment (Assessment to be complete while patient is alone) Physical Abuse: Denies Verbal Abuse: Denies Sexual Abuse: Denies Exploitation of patient/patient's resources: Denies Self-Neglect: Denies  Emotional Status Emotional Status Pt's affect, behavior adn adjustment status: Pt is motivated to improve and get home as quickly as possible.  Wife here to observe in therapies Recent Psychosocial Issues: Other medical issues but active worked fulltime Pyschiatric History: History-depression takes meds for this and feels it helps.  Deferred Beck Depression Screen felt not necessary at this time, will continue to monitor.  Patient/Family Perceptions, Expectations & Goals Pt/Family Perceptions, Expectations and Goals Pt/Family understanding of illness & functional limitations: Pt and wife have a good understanding of his fractures and limitations.  Pt wants to be here a short time Premorbid pt/family roles/activities: Employee, Husband, Father, Home owner, [employer], etc Anticipated changes in roles/activities/participation: Plans to resume at discharge. Pt/family expectations/goals: Pt states : " I need to get home to my dogs, they miss me." Wife reports : " See he wants to be with his dogs I am right here."  Both use humor to get through difficult times.  Wife reports: " I will do whatever he needs. "  Careers adviser: None Premorbid Home Care/DME Agencies: None Transportation available at discharge: Wife  English as a second language teacher Resources: Media planner  (specify) Herbalist) Financial Resources: Employment Financial Screen Referred: Previously completed (Completed at  [facility]) Living Expenses: Lives with family Money Management: Patient;Spouse Home Management: Wife Patient/Family Preliminary Plans: Retrun home with wife assisting of needed.  Hopes to be here a short time, concerned about the bill.  Clinical Impression:  Pleasant couple who are funny together, can tell have been married a long time.  Pt is concerned about the bills, wife wants him to focus On his rehab and recovery.  Wife very supportive of pt.  Pt active prior to admission should do well here.      Bruce [name] 02/08/2011

## 2011-02-08 NOTE — Evaluation (Signed)
Occupational Therapy Assessment and Plan & Session Notes  Patient Details  Name: Bruce [name] MRN: [id number] Date of Birth: 09-Mar-1945  OT Diagnosis: acute pain and muscle weakness (generalized) Rehab Potential: Rehab Potential: Good ELOS: 10-14 days   Today's Date: 02/08/2011  Assessment & Plan Clinical Impression:Marico B Stracener is an 66 y.o. obese Caucasian male with mild COPD with occasional use of albuterol inhalers; mild thrombocytopenia being evaluated by Dr. 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 Admitted to [facility] on 01/15 past missing a step while up on a ladder repairing his home, with fell off a ladder sustaining injury to his left wrist and his left hip. Fall from the ladder was not associated with any dizziness palpitation shortness of breath chest pain nausea or vomiting. He was brought to the emergency room where x-rays revealed possible small avulsion fracture of triquetrum and acute displaced fracture of proximal left IT femur and fracture of lateral femoral shaft. Evaluated by Dr. Hilda Lias and patient underwent ORIF left hip and left proximal shaft of femur on 01/16 Left wrist splinted. Post op TDWB on LLE and NWB left wrist.On SQ lovenox for DVT prophylaxis for 4 weeks total per ortho. Left wrist splint changed to cock-up splint on 01/20. ABLA treated with 1 unit PRBC with last hgb @9 .1. Hyponatremia treated with gentle hydration. Patient did have some issues with confusion likely due to PCA. He had some issues with bradycardia and atenolol dose decreased. Patient transferred to CIR on 02/07/2011 .  Patient's past medical history is significant for: Eosinophilia   Thrombocytopenia   COPD (chronic obstructive pulmonary disease)   Obesity   Degenerative joint disease   Depression   Gout   Anemia  02/02/2011   Hyperlipidemia   Hip fracture, left  January 2013.  Status post ORIF    Patient currently requires supervision for UB ADLs, moderate  assistance for bathing & ADL transfers, and total assist for LB ADLs secondary to muscle weakness.  Prior to hospitalization, patient could complete ADLs & IADLs independently. 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   Patient will benefit from skilled intervention to increase independence with basic self-care skills prior to discharge home with care partner.  Anticipate patient will require intermittent supervision and additional occupational therapy is to be determined. Goals set at an overall modified independent level for ADLs and ADL transfers and ELOS = 10-14 days.   OT - End of Session Activity Tolerance: Tolerates 10 - 20 min activity with multiple rests OT Assessment Rehab Potential: Good Barriers to Discharge: None OT Plan OT Frequency: 1-2 X/day, 60-90 minutes Estimated Length of Stay: 10-14 days OT Treatment/Interventions: Ambulation/gait training;Balance/vestibular training;Community reintegration;DME/adaptive equipment instruction;Functional mobility training;Pain management;Neuromuscular re-education;Patient/family education;Self Care/advanced ADL retraining;Splinting/orthotics;Therapeutic Activities;Therapeutic Exercise;UE/LE Strength taining/ROM;UE/LE Coordination activities;Visual/perceptual remediation/compensation;Wheelchair propulsion/positioning OT Recommendation Follow Up Recommendations:  (OT:  TBD) Equipment Recommended: 3 in 1 bedside comode;Tub/shower bench  Precautions/Restrictions  Precautions Precautions: Fall Precaution Comments: weight bearing restricitions, see below Required Braces or Orthoses: Yes Other Brace/Splint: support splint for left wrist Restrictions Weight Bearing Restrictions: Yes LUE Weight Bearing: Non weight bearing (through wrist, orders written for okay to WB through forearm) LLE Weight Bearing: Touchdown weight bearing  General Chart Reviewed: Yes  Pain Pain Assessment Pain Assessment: No/denies  pain Pain Score: 0-No pain Pain Type: Acute pain Pain Location: Leg Pain Orientation: Left Pain Descriptors: Aching Pain Frequency: Occasional Pain Onset: Gradual Patients Stated Pain Goal: 3 Pain Intervention(s): Medication (See eMAR)  Home Living/Prior Functioning Home Living Lives With: Spouse Receives Help From: Family Type of Home: House Home Layout: One level Home Access: Stairs to enter Entrance Stairs-Rails: None Entrance Stairs-Number of Steps: 1 for main enterence & 1 step to enter hallway; 1 step split level Bathroom Shower/Tub: Engineer, manufacturing systems: Standard Bathroom Accessibility: Yes How Accessible: Accessible via wheelchair;Accessible via walker (1 would be w/c; bother are via walker) Home Adaptive Equipment: Grab bars in shower;Hand-held shower hose;Straight cane IADL History Homemaking Responsibilities: Yes Current License: Yes Mode of Transportation: Car Occupation: Part time employment Type of Occupation: Holiday representative Prior Function Level of Independence: Independent with basic ADLs;Independent with homemaking with ambulation;Independent with gait;Independent with transfers Driving: Yes Vocation: Part time employment  ADL - See FIM   Vision/Perception  Vision - History Baseline Vision: Wears glasses all the time Patient Visual Report: No change from baseline Vision - Assessment Eye Alignment: Within Functional Limits Perception Perception: Within Functional Limits   Cognition Overall Cognitive Status: Appears within functional limits for tasks assessed Arousal/Alertness: Awake/alert Orientation Level: Oriented X4  Sensation Sensation Light Touch: Appears Intact Additional Comments: UEs appear intact  Extremity/Trunk Assessment RUE Assessment RUE Assessment: Within Functional Limits LUE Assessment LUE Assessment: Exceptions to WFL LUE PROM (degrees) Overall PROM Left Upper Extremity: Within functional limits for tasks  assessed (NWB through left wrist; patient can WB through forearm)  Recommendations for other services: None  Discharge Criteria: Patient will be discharged from OT if patient refuses treatment 3 consecutive times without medical reason, if treatment goals not met, if there is a change in medical status, if patient makes no progress towards goals or if patient is discharged from hospital.  The above assessment, treatment plan, treatment alternatives and goals were discussed and mutually agreed upon: by patient and by family  SESSION NOTES  Session #1 [id number] - 60 Minutes Individual Therapy No complaints of pain  Initial 1:1 OT evaluation completed. Treatment emphasis on bed mobility, ADL retraining at bed level in sit -> stand position, functional ambulation/mobility throughout room using platform rolling walker, toilet transfer, and toileting (perineal hygiene & clothing management).   Session #2 1300-1330 - 30 Minutes Individual Therapy No complaints of pain  Treatment focus on functional mobility/ambulation using platform rolling walker throughout hallway and in ADL apartment and attempted a tub/shower transfer onto tub transfer bench. Patient able to ambulate -> tub bench and sit on bench with minimal assistance from therapist, but unable to throw legs into tub/over threshold at this time. Plan to practice transfer again this week. Patient with decreased activity tolerance/endurance and got a little out of breath during session.   Aissa Lisowski 02/08/2011, 9:52 AM

## 2011-02-08 NOTE — Progress Notes (Signed)
Physical Therapy Assessment and Plan  Patient Details  Name: Bruce [name] MRN: [id number] Date of Birth: [birth year]  PT Diagnosis: Difficulty walking, Edema, Muscle weakness and Pain in joint Rehab Potential: Good ELOS: 10-14 days   Today's Date: 02/08/2011 Time: 1030-1130 Time Calculation (min): 60 min  Assessment & Plan Clinical Impression: Patient is a 66 y.o. year old male with recent admission to the hospital on 01/15 after missing a step while up on a ladder repairing his home, with fell off a ladder sustaining injury to his left wrist and his left hip. Fall from the ladder was not associated with any dizziness palpitation shortness of breath chest pain nausea or vomiting. He was brought to the emergency room where x-rays revealed possible small avulsion fracture of triquetrum and acute displaced fracture of proximal left IT femur and fracture of lateral femoral shaft. Pt with mild COPD with occasional use of albuterol inhalers; mild thrombocytopenia being evaluated by Dr. 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 Evaluated by Dr. Hilda Lias and patient underwent ORIF left hip and left proximal shaft of femur on 01/16 Left wrist splinted. Post op TDWB on LLE and NWB left wrist.On SQ lovenox for DVT prophylaxis for 4 weeks total per ortho. Left wrist splint changed to cock-up splint on 01/20. ABLA treated with 1 unit PRBC with last hgb @9 .1. Hyponatremia treated with gentle hydration. Patient did have some issues with confusion likely due to PCA. He had some issues with bradycardia and atenolol dose decreased. Patient transferred to CIR on 02/07/2011 .   Patient currently requires min/modA with mobility secondary to muscle weakness and muscle joint tightness, decreased cardiorespiratoy endurance and decreased standing balance and decreased balance strategies.  Prior to hospitalization, patient was independent with mobility and lived with Spouse in a House home.  Home  access is 1 for main enterence & 1 step to enter hallway; 1 step split levelStairs to enter.  Patient will benefit from skilled PT intervention to maximize safe functional mobility, minimize fall risk and decrease caregiver burden for planned discharge home with 24 hour supervision.  Anticipate patient will TBD HHPT v OPPT at discharge.  PT - End of Session Activity Tolerance: Tolerates 30+ min activity with multiple rests (cueing needed for deep breathing, tends to hold breath) PT Assessment Rehab Potential: Good Barriers to Discharge: None PT Plan PT Frequency: 1-2 X/day, 60-90 minutes Estimated Length of Stay: 10-14 days PT Treatment/Interventions: Ambulation/gait training;Balance/vestibular training;Community reintegration;DME/adaptive equipment instruction;Functional mobility training;Neuromuscular re-education;Pain management;Patient/family education;Stair training;Splinting/orthotics;Therapeutic Activities;Therapeutic Exercise;UE/LE Strength taining/ROM;UE/LE Coordination activities;Wheelchair propulsion/positioning PT Recommendation Follow Up Recommendations: Other (comment) (HHPT v OPPT TBD) Equipment Recommended: Wheelchair (measurements);Wheelchair cushion (measurements) (has platform RW)  Precautions/Restrictions Precautions Precautions: Fall Precaution Comments: weight bearing restricitions, see below Required Braces or Orthoses: Yes Other Brace/Splint: support splint for left wrist Restrictions Weight Bearing Restrictions: Yes LUE Weight Bearing: Non weight bearing LLE Weight Bearing: Touchdown weight bearing    Cognition Overall Cognitive Status: Appears within functional limits for tasks assessed Arousal/Alertness: Awake/alert Orientation Level: Oriented X4  Pain - c/o pain in left hip/ leg with mobility, declined pain medicine at this time.   Sensation Sensation Light Touch: Appears Intact Proprioception: Appears Intact Coordination Gross Motor Movements are  Fluid and Coordinated: No (LLE due to pain and decreased strength, otherwise [facility]) Motor  Motor Motor: Within Functional Limits;Other (comment) (weakness and decreased strength/limited to pain and WB onLLE)      Trunk/Postural Assessment  Cervical Assessment Cervical Assessment: Within Functional Limits  Thoracic Assessment Thoracic Assessment: Within Functional Limits Lumbar Assessment Lumbar Assessment: Within Functional Limits (decreased WB on L hip) Postural Control Postural Control: Within Functional Limits  Balance Balance Balance Assessed: Yes Static Sitting Balance Static Sitting - Level of Assistance: 5: Stand by assistance Dynamic Sitting Balance Dynamic Sitting - Level of Assistance: 4: Min assist Static Standing Balance Static Standing - Level of Assistance: 4: Min assist Dynamic Standing Balance Dynamic Standing - Level of Assistance: 4: Min assist Extremity Assessment      RLE Assessment RLE Assessment: Within Functional Limits LLE Assessment LLE Assessment: Exceptions to WFL LLE PROM (degrees) Overall PROM Left Lower Extremity: Deficits;Due to pain;Other (Comment) (and weakness) LLE Overall PROM Comments: decreased hip and knee flexion due to pain, ankle WFL LLE Strength LLE Overall Strength: Deficits LLE Overall Strength Comments: ankle 3/5, hip and knee 2-/5 Increased edema noted in L thigh/hip area - elevating legrest placed on w/c and education for keeping LE elevated.  Recommendations for other services: None  Discharge Criteria: Patient will be discharged from PT if patient refuses treatment 3 consecutive times without medical reason, if treatment goals not met, if there is a change in medical status, if patient makes no progress towards goals or if patient is discharged from hospital.  The above assessment, treatment plan, treatment alternatives and goals were discussed and mutually agreed upon: by patient and by family  Individual therapy treatment  initiated with AAROM to LLE for decreased swelling and increase ROM for functional mobility. Gait training with left platform RW x 30', x 15' with overall minA, cueing for gait pattern for WB precautions, to take smaller step with LLE. Pt also reminded of cues for breathing through out activity, tending to hold breath with sats decreased to 89% but with a few deep breaths recovered to 98% on room air within 10 seconds. Therapist demonstrated technique for up/down 1 step with PFRW, will attempt in later  due to fatigue and pain increasing. Min A transfer back to bed with PFRW and min A to return to supine in the bed to rest.   Tedd Sias 02/08/2011, 12:25 PM

## 2011-02-08 NOTE — Progress Notes (Addendum)
Physical Therapy Session Note  Patient Details  Name: Bruce [name] MRN: [id number] Date of Birth: 10/21/1945  Today's Date: 02/08/2011 Time: 4098-1191 Time Calculation (min): 45 min  Precautions: Precautions Precautions: Fall Precaution Comments: weight bearing restricitions, see below Required Braces or Orthoses: Yes Other Brace/Splint: support splint for left wrist Restrictions Weight Bearing Restrictions: Yes LUE Weight Bearing: Non weight bearing LLE Weight Bearing: Touchdown weight bearing  Short Term Goals: PT Short Term Goal 1: Demonstrate functional transfers with supervision PT Short Term Goal 2: Demonstrate dynamic standing balance with supervision for functional task PT Short Term Goal 3: Perform gait x 50' with supervision with Left PFRW  Pain reported 2/10 in LLE, premedicated.  Other Treatments   Nustep x 10 minutes for LE AAROM, general activity tolerance and decrease edema. Encouraged pt to use RUE to assist with LLE movement to prevent increased pressure on LLE, good compliance. Pt able to increase knee and hip ROM at his own pace and reported this felt really good on his leg, not painful at all. Stair training with PFRW up/down 1 step backwards with mod A, most difficulty with managing the RW. Gait training with left PFRW x 100' with steady A/ supervision, cueing for gait pattern with AD. Overall excellent participation this afternoon and pt very motivated to work with therapy.  Therapy/Group: Individual Therapy  Karolee Stamps [facility] 02/08/2011, 3:59 PM

## 2011-02-08 NOTE — Plan of Care (Signed)
Overall Plan of Care [facility]) Patient Details Name: Bruce [name] MRN: [id number] DOB: [birth year]  Diagnosis:  Left intertrochanteric hip fracture/left femoral shaft fracture/questionable left  triquetrum fracture Primary Diagnosis:    Closed left subtrochanteric femur fracture Co-morbidities: Wound care, pain management  Functional Problem List  Patient demonstrates impairments in the following areas: Balance, Edema, Endurance, Pain and Skin Integrity  Basic ADL's: grooming, bathing, dressing and toileting Advanced ADL's: simple meal preparation  Transfers:  bed mobility, bed to chair, toilet, tub/shower, car and furniture Locomotion:  ambulation, wheelchair mobility and stairs  Additional Impairments:  Functional use of upper extremity  Anticipated Outcomes Item Anticipated Outcome  Eating/Swallowing    Basic self-care  Mod I  Tolieting  Mod I  Bowel/Bladder  Continent of bowel and bladder  Transfers  Modified independent, minA car  Locomotion  Modified independent gait and w/c mobility, minA step for home entry  Communication    Cognition    Pain  <3  Safety/Judgment    Other     Therapy Plan: PT Frequency: 1-2 X/day, 60-90 minutes OT Frequency: 1-2 X/day, 60-90 minutes     Team Interventions: Item RN PT OT SLP SW TR Other  Self Care/Advanced ADL Retraining   x      Neuromuscular Re-Education  x x      Therapeutic Activities  x x      UE/LE Strength Training/ROM  x x      UE/LE Coordination Activities  x x      Visual/Perceptual Remediation/Compensation         DME/Adaptive Equipment Instruction  x x      Therapeutic Exercise  x x      Balance/Vestibular Training  x x      Patient/Family Education  x x      Cognitive Remediation/Compensation         Functional Mobility Training  x x      Ambulation/Gait Training  x x      Stair Training  x       Wheelchair Propulsion/Positioning  x x      Functional Tourist information centre manager  Reintegration  x x      Dysphagia/Aspiration Film/video editor         Bladder Management         Bowel Management         Disease Management/Prevention         Pain Management  x x      Medication Management         Skin Care/Wound Management  x x      Splinting/Orthotics  x x      Discharge Planning  x x  x    Psychosocial Support     x                       Team Discharge Planning: Destination:  Home Projected Follow-up:  PT and HHPT v OPPT, Additional OT is TBD Projected Equipment Needs:  Wheelchair, Tub Transfer Bench? & BSC? Patient/family involved in discharge planning:  Yes  MD ELOS: 1 week Medical Rehab Prognosis:  Excellent Assessment: Patient admitted for comprehensive inpatient rehabilitation. He will be participating with PT and OT for at least 3 hours of therapy per day at least 5 days a week a dressing mobility, ADLs, adaptive equipment training, safety, pain management, upper  or lower extremity strength and range of motion. Goals will be set at a modified independent level with and without the wheelchair. Patient is quite motivated.

## 2011-02-08 NOTE — Progress Notes (Signed)
Inpatient Rehabilitation Center Individual Statement of Services  Patient Name:  Bruce [name]  Date:  02/08/2011  Welcome to the Inpatient Rehabilitation Center.  Our goal is to provide you with an individualized program based on your diagnosis and situation, designed to meet your specific needs.  With this comprehensive rehabilitation program, you will be expected to participate in at least 3 hours of rehabilitation therapies Monday-Friday, with modified therapy programming on the weekends.  Your rehabilitation program will include the following services:  Physical Therapy (PT), Occupational Therapy (OT), 24 hour per day rehabilitation nursing, Therapeutic Recreaction (TR), Case Management (RN and Child psychotherapist), Rehabilitation Medicine, Nutrition Services and Pharmacy Services  Weekly team conferences will be held on Tuesdays to discuss your progress.  Your RN Case Designer, television/film set will talk with you frequently to get your input and to update you on team discussions.  Team conferences with you and your family in attendance may also be held.  Estimated length of stay: 10-14 days Overall predicted outcome:Modified independent   Depending on your progress and recovery, your program may change.  Your RN Case Estate agent will coordinate services and will keep you informed of any changes.  Your RN Sports coach and SW names and contact numbers are listed  below.  The following services may also be recommended but are not provided by the Inpatient Rehabilitation Center:   Driving Evaluations  Home Health Rehabiltiation Services  Outpatient Rehabilitatation [facility]  Vocational Rehabilitation   Arrangements will be made to provide these services after discharge if needed.  Arrangements include referral to agencies that provide these services.  Your insurance has been verified to be:  BCBS Your primary doctor is:  Dr Lysbeth Galas  Pertinent information will be shared with  your doctor and your insurance company.  Case Manager: Lutricia Horsfall, [facility] [phone]  Social Worker:  Dossie Der, [place] [phone]  Information discussed with and copy given to patient by: Meryl Dare, 02/09/2011

## 2011-02-08 NOTE — Progress Notes (Signed)
Patient ID: Bruce [name], male   DOB: Jun 07, 1945, 66 y.o.   MRN: [id number] Subjective/Complaints: Review of Systems  Musculoskeletal: Positive for joint pain (left leg and wirst).  All other systems reviewed and are negative.  1/23 slept well   Objective: Vital Signs: Blood pressure 115/65, pulse 54, temperature 98.6 F (37 C), temperature source Oral, resp. rate 19, height 5\' 7"  (1.702 m), weight 120.748 kg (266 lb 3.2 oz), SpO2 93.00%. No results found.  Basename 02/08/11 0520 02/06/11 0448  WBC 6.6 5.1  HGB 9.7* 9.1*  HCT 28.6* 26.5*  PLT 211 172    Basename 02/08/11 0520  NA 135  K 4.1  CL 101  CO2 26  GLUCOSE 91  BUN 19  CREATININE 0.98  CALCIUM 8.9   CBG (last 3)  No results found for this basename: GLUCAP:3 in the last 72 hours  Wt Readings from Last 3 Encounters:  02/07/11 120.748 kg (266 lb 3.2 oz)  02/07/11 122.562 kg (270 lb 3.2 oz)  02/07/11 122.562 kg (270 lb 3.2 oz)    Physical Exam:  General appearance: alert, cooperative and no distress Head: Normocephalic, without obvious abnormality, atraumatic Eyes: conjunctivae/corneas clear. PERRL, EOM's intact. Fundi benign. Ears: normal TM's and external ear canals both ears Nose: Nares normal. Septum midline. Mucosa normal. No drainage or sinus tenderness. Throat: lips, mucosa, and tongue normal; teeth and gums normal Neck: no adenopathy, no carotid bruit, no JVD, supple, symmetrical, trachea midline and thyroid not enlarged, symmetric, no tenderness/mass/nodules Back: symmetric, no curvature. ROM normal. No CVA tenderness. Resp: clear to auscultation bilaterally Cardio: regular rate and rhythm, S1, S2 normal, no murmur, click, rub or gallop GI: soft, non-tender; bowel sounds normal; no masses,  no organomegaly Extremities: extremities normal, atraumatic, no cyanosis or edema Pulses: 2+ and symmetric Skin: Skin color, texture, turgor normal. No rashes or lesions Neurologic: Grossly  normal Incision/Wound: left leg incisions clean and intact with minimal serous drainage Exam updated 1/23   Assessment/Plan: 1. Functional deficits secondary to left femoral neck and shaft fx's s/p ORIF and ? Left triquetrum fx which require 3+ hours per day of interdisciplinary therapy in a comprehensive inpatient rehab setting. Physiatrist is providing close team supervision and 24 hour management of active medical problems listed below. Physiatrist and rehab team continue to assess barriers to discharge/monitor patient progress toward functional and medical goals.  TDWB LLE and NWB L wrist  Mobility:         ADL:   Cognition: Cognition Orientation Level: Oriented X4 Cognition Orientation Level: Oriented X4  1. DVT Prophylaxis/Anticoagulation: Pharmaceutical: Lovenox for 4 weeks post op  2. Pain Management: monitor on prn percocet and robaxin. Monitor for sedation or confusion. May need scheduled meds at some point but will hold for now. 3. Mood: continue celexa.  4. ABLA: Add iron supplement for now.  5. COPD: continue symbicort bid with albuterol prn SOB.  6. Bradycardia: monitor with bid checks. Heart rate still in the 50s. Proceed with atenolol as long as he's not symptomatic or HR doesn't drop any further 7. Mild thrombocytopenia: Platelets normalized. Monitor for  now on lovenox.  8. Ortho- tdwb thru left leg and nwb left wrist.   LOS (Days) 1 A FACE TO FACE EVALUATION WAS PERFORMED  Contrell Ballentine T 02/08/2011, 6:56 AM

## 2011-02-09 NOTE — Progress Notes (Signed)
Patient is alert and oriented, medicated for pain x1 today.  Participated in PT today.  Up with standby assist with walker.  LBM 02/09/11, had senna this am and complained that senna made him nauseated.  Senna was subsequently discontinued by Marissa Nestle, PA.  Patient had loose stool at midday and felt somewhat better.  Ate better at supper.  Wife at bedside.

## 2011-02-09 NOTE — Progress Notes (Signed)
Physical Therapy Session Note  Patient Details  Name: Bruce [name] MRN: [id number] Date of Birth: 1945/06/21  Today's Date: 02/09/2011 Time: 1030-1125 Time Calculation (min): 55 min  Precautions: Precautions Precautions: Fall Precaution Comments: weight bearing restricitions, see below Required Braces or Orthoses: Yes Other Brace/Splint: support splint for left wrist Restrictions Weight Bearing Restrictions: Yes LUE Weight Bearing: Non weight bearing LLE Weight Bearing: Touchdown weight bearing  Short Term Goals: PT Short Term Goal 1: Demonstrate functional transfers with supervision PT Short Term Goal 2: Demonstrate dynamic standing balance with supervision for functional task PT Short Term Goal 3: Perform gait x 56' with supervision with Left PFRW  Skilled Therapeutic Interventions/Progress Updates: Bed mobility retraining with overall supervision/min A to manage left lower extremity. Initial sit to stand required mod A, but reminded pt not to pull on walker and able to complete with min A. Supine therex for lower extremity ROM and strengthening: ankle pumps, heel slides (maxi slide for L lower extremity), hip abduction, glut/quad sets, and SAQ x 15 reps each bilateral lower extremities. Used 5# ankle weight for R lower extremity. Gait training with PFRW with close supervision, cues to decreased step length with L leg to maintain WB status back to pt room.    Pain Pain Assessment Pain Score:   4 Premedicated per pt report.     Therapy/Group: Individual Therapy  Karolee Stamps [facility] 02/09/2011, 11:38 AM

## 2011-02-09 NOTE — Progress Notes (Signed)
Patient ID: DAESHAUN SPECHT, male   DOB: [birth year], 66 y.o.   MRN: [id number] Patient ID: LANDYN BUCKALEW, male   DOB: December 20, 1945, 66 y.o.   MRN: [id number] Subjective/Complaints: Review of Systems  Musculoskeletal: Positive for joint pain (left leg and wirst).  All other systems reviewed and are negative.   1/24---pain well controlled   Objective: Vital Signs: Blood pressure 135/61, pulse 60, temperature 98.2 F (36.8 C), temperature source Oral, resp. rate 19, height 5\' 7"  (1.702 m), weight 120.748 kg (266 lb 3.2 oz), SpO2 98.00%. No results found.  Basename 02/08/11 0520  WBC 6.6  HGB 9.7*  HCT 28.6*  PLT 211    Basename 02/08/11 0520  NA 135  K 4.1  CL 101  CO2 26  GLUCOSE 91  BUN 19  CREATININE 0.98  CALCIUM 8.9   CBG (last 3)  No results found for this basename: GLUCAP:3 in the last 72 hours  Wt Readings from Last 3 Encounters:  02/07/11 120.748 kg (266 lb 3.2 oz)  02/07/11 122.562 kg (270 lb 3.2 oz)  02/07/11 122.562 kg (270 lb 3.2 oz)    Physical Exam:  General appearance: alert, cooperative and no distress Head: Normocephalic, without obvious abnormality, atraumatic Eyes: conjunctivae/corneas clear. PERRL, EOM's intact. Fundi benign. Ears: normal TM's and external ear canals both ears Nose: Nares normal. Septum midline. Mucosa normal. No drainage or sinus tenderness. Throat: lips, mucosa, and tongue normal; teeth and gums normal Neck: no adenopathy, no carotid bruit, no JVD, supple, symmetrical, trachea midline and thyroid not enlarged, symmetric, no tenderness/mass/nodules Back: symmetric, no curvature. ROM normal. No CVA tenderness. Resp: clear to auscultation bilaterally Cardio: regular rate and rhythm, S1, S2 normal, no murmur, click, rub or gallop GI: soft, non-tender; bowel sounds normal; no masses,  no organomegaly Extremities: trace  To 1+ edema on LLE. Pulses: 2+ and symmetric Skin: Skin color, texture, turgor normal. No rashes or  lesions Neurologic: Grossly normal Incision/Wound: left leg incisions clean and intact with minimal serous drainage still Exam updated 1/24   Assessment/Plan: 1. Functional deficits secondary to left femoral neck and shaft fx's s/p ORIF and ? Left triquetrum fx which require 3+ hours per day of interdisciplinary therapy in a comprehensive inpatient rehab setting. Physiatrist is providing close team supervision and 24 hour management of active medical problems listed below. Physiatrist and rehab team continue to assess barriers to discharge/monitor patient progress toward functional and medical goals.  TDWB LLE and NWB L wrist  Mobility:         ADL:   Cognition: Cognition Overall Cognitive Status: Appears within functional limits for tasks assessed Arousal/Alertness: Awake/alert Orientation Level: Oriented X4 Cognition Arousal/Alertness: Awake/alert Orientation Level: Oriented X4  1. DVT Prophylaxis/Anticoagulation: Pharmaceutical: Lovenox for 4 weeks post op  2. Pain Management: monitor on prn percocet and robaxin. Good control at present. 3. Mood: continue celexa.  4. ABLA: iron supplement 5. COPD: continue symbicort bid with albuterol prn SOB.  6. Bradycardia: monitor with bid checks. Heart rate still in the 50s.  atenolol as long as he's not symptomatic or HR doesn't drop any further 7. Mild thrombocytopenia: Platelets normalized.   8. Ortho- tdwb thru left leg and nwb left wrist. He may propel w/c short dx only (less than 30 ft)   LOS (Days) 2 A FACE TO FACE EVALUATION WAS PERFORMED  SWARTZ,ZACHARY T 02/09/2011, 8:07 AM

## 2011-02-09 NOTE — Progress Notes (Signed)
Physical Therapy Session Note  Patient Details  Name: Bruce [name] MRN: [id number] Date of Birth: [birth year]  Today's Date: 02/09/2011 Time: 4098-1191 Time Calculation (min): 45 min  Precautions: Precautions Precautions: Fall Precaution Comments: weight bearing restricitions, see below Required Braces or Orthoses: Yes Other Brace/Splint: support splint for left wrist Restrictions Weight Bearing Restrictions: Yes LUE Weight Bearing: Non weight bearing LLE Weight Bearing: Touchdown weight bearing  Short Term Goals: PT Short Term Goal 1: Demonstrate functional transfers with supervision PT Short Term Goal 2: Demonstrate dynamic standing balance with supervision for functional task PT Short Term Goal 3: Perform gait x 50' with supervision with Left PFRW   Pain  2/10 pain in left leg, declined pain medicine at this time.     Pt's wife and daughter observed therapy. Simulated car transfer problem solving through which vehicle (they have multiple trucks) would be best and discussed recommendations for pushing seat all the way back or sitting in back seat to be able to scoot across bench in order to get LLE into the car (unable with simulated car due to ROM restriction). May bring in real truck to practice prior to d/c if ROM doesn't significantly improve.  Stair training with PFRW up/down curb step with minA, good carryover from yesterday noted and pt stated he felt more comfortable. Pt's wife, not comfortable with it stating it made her nervous, no hands on with this yet.  Nustep for ROM and edema control x 8 minutes on level 2, minimal use of LUE and LLE.   Therapy/Group: Individual Therapy  Bruce [name] [facility] 02/09/2011, 4:14 PM

## 2011-02-09 NOTE — Progress Notes (Signed)
Occupational Therapy Session Notes  Patient Details  Name: Bruce [name] MRN: [id number] Date of Birth: Nov 08, 1945  Today's Date: 02/09/2011  Precautions: Precautions Precautions: Fall Precaution Comments: weight bearing restricitions, see below Required Braces or Orthoses: Yes Other Brace/Splint: support splint for left wrist Restrictions Weight Bearing Restrictions: Yes LUE Weight Bearing: Non weight bearing LLE Weight Bearing: Touchdown weight bearing  Short Term Goals: OT Short Term Goal 1: Patient will bathe 10/10 body parts with minimal assistance OT Short Term Goal 2: Patient will complete LB dressing with minimal assistance OT Short Term Goal 3: Patient will perform toileting (perineal hygiene & clothing management) with supervision OT Short Term Goal 4: Patient will perform grooming tasks independently  ADL - See FIM   Session #1 4098-1191 - 60 Minutes Individual Therapy No complaints of pain  Upon entering room, patient supine in bed. Patient stated he already had clean clothes donned. Engaged in bed mobility (supine -> sit with supervision & HOB elevated w/ rails up), dynamic sitting edge of bed to donn socks (introduced and educated patient on use of sock aid). Patient and wife stated they have been getting up to go into bathroom without help from staff, educated them on safety concerns and educated patient and wife on ambulating <-> bathroom; checked wife off to take patient to bathroom using platform rolling walker and steady assist from wife. Discussed reacher, sock aid, long-handled shoe horn, and long handled brush to increase independence with ADLs; gave visual handout on hip kit.   Session #2 1330-1400 - 30 Minutes Individual Therapy No complaints of pain  Skilled intervention focusing on bed mobility without HOB raised and without bed rails, edge of bed -> w/c using platform rolling walker, functional ambulation around ADL apartment using platform rolling  walker, and tub/shower transfer onto tub transfer bench with minimal assistance from therapist. Wife present during session for education on safety, ambulation, and tub/shower transfer.    Jaymari Cromie 02/09/2011, 8:53 AM

## 2011-02-10 DIAGNOSIS — S72309A Unspecified fracture of shaft of unspecified femur, initial encounter for closed fracture: Secondary | ICD-10-CM

## 2011-02-10 DIAGNOSIS — W11XXXA Fall on and from ladder, initial encounter: Secondary | ICD-10-CM

## 2011-02-10 DIAGNOSIS — S62109A Fracture of unspecified carpal bone, unspecified wrist, initial encounter for closed fracture: Secondary | ICD-10-CM

## 2011-02-10 DIAGNOSIS — Z5189 Encounter for other specified aftercare: Secondary | ICD-10-CM

## 2011-02-10 DIAGNOSIS — S72143A Displaced intertrochanteric fracture of unspecified femur, initial encounter for closed fracture: Secondary | ICD-10-CM

## 2011-02-10 NOTE — Progress Notes (Signed)
Subjective/Complaints: Review of Systems  Musculoskeletal: Positive for joint pain (left leg and wirst).  All other systems reviewed and are negative.   1/25---some nausea yesterday. Got behind on pain meds.   Objective: Vital Signs: Blood pressure 112/71, pulse 51, temperature 97.9 F (36.6 C), temperature source Oral, resp. rate 20, height 5\' 7"  (1.702 m), weight 120.748 kg (266 lb 3.2 oz), SpO2 97.00%. No results found.  Basename 02/08/11 0520  WBC 6.6  HGB 9.7*  HCT 28.6*  PLT 211    Basename 02/08/11 0520  NA 135  K 4.1  CL 101  CO2 26  GLUCOSE 91  BUN 19  CREATININE 0.98  CALCIUM 8.9   CBG (last 3)  No results found for this basename: GLUCAP:3 in the last 72 hours  Wt Readings from Last 3 Encounters:  02/07/11 120.748 kg (266 lb 3.2 oz)  02/07/11 122.562 kg (270 lb 3.2 oz)  02/07/11 122.562 kg (270 lb 3.2 oz)    Physical Exam:  General appearance: alert, cooperative and no distress Head: Normocephalic, without obvious abnormality, atraumatic Eyes: conjunctivae/corneas clear. PERRL, EOM's intact. Fundi benign. Ears: normal TM's and external ear canals both ears Nose: Nares normal. Septum midline. Mucosa normal. No drainage or sinus tenderness. Throat: lips, mucosa, and tongue normal; teeth and gums normal Neck: no adenopathy, no carotid bruit, no JVD, supple, symmetrical, trachea midline and thyroid not enlarged, symmetric, no tenderness/mass/nodules Back: symmetric, no curvature. ROM normal. No CVA tenderness. Resp: clear to auscultation bilaterally Cardio: regular rate and rhythm, S1, S2 normal, no murmur, click, rub or gallop GI: soft, non-tender; bowel sounds normal; no masses,  no organomegaly Extremities: trace  To 1+ edema on LLE. Pulses: 2+ and symmetric Skin: Skin color, texture, turgor normal. No rashes or lesions Neurologic: Grossly normal Incision/Wound: left leg incisions clean and intact Exam updated 1/25   Assessment/Plan: 1. Functional  deficits secondary to left femoral neck and shaft fx's s/p ORIF and ? Left triquetrum fx which require 3+ hours per day of interdisciplinary therapy in a comprehensive inpatient rehab setting. Physiatrist is providing close team supervision and 24 hour management of active medical problems listed below. Physiatrist and rehab team continue to assess barriers to discharge/monitor patient progress toward functional and medical goals.  TDWB LLE and NWB L wrist  Mobility:         ADL:   Cognition: Cognition Overall Cognitive Status: Appears within functional limits for tasks assessed Arousal/Alertness: Awake/alert Orientation Level: Oriented X4 Cognition Arousal/Alertness: Awake/alert Orientation Level: Oriented X4  1. DVT Prophylaxis/Anticoagulation: Pharmaceutical: Lovenox for 4 weeks post op  2. Pain Management: monitor on prn percocet and robaxin. Good control at present. 3. Mood: continue celexa.  4. ABLA: iron supplement. Recheck h/h monday 5. COPD: continue symbicort bid with albuterol prn SOB.  6. Bradycardia: monitor with bid checks. Heart rate still in the 50s.  atenolol as long as he's not symptomatic or HR doesn't drop below 50 7. Mild thrombocytopenia: Platelets normalized.   8. Ortho- tdwb thru left leg and nwb left wrist. He may propel w/c short dx only (less than 30 ft)   LOS (Days) 3 A FACE TO FACE EVALUATION WAS PERFORMED  SWARTZ,ZACHARY T 02/10/2011, 8:10 AM

## 2011-02-10 NOTE — Progress Notes (Signed)
Physical Therapy Session Note  Patient Details  Name: ROMEY MATHIESON MRN: [id number] Date of Birth: Sep 04, 1945  Today's Date: 02/10/2011 Time: 0825-0925 Time Calculation (min): 60 min  Precautions: Precautions Precautions: Fall Precaution Comments: weight bearing restricitions, see below Required Braces or Orthoses: Yes Other Brace/Splint: support splint for left wrist Restrictions Weight Bearing Restrictions: Yes LUE Weight Bearing: Non weight bearing LLE Weight Bearing: Touchdown weight bearing  Short Term Goals: PT Short Term Goal 1: Demonstrate functional transfers with supervision PT Short Term Goal 2: Demonstrate dynamic standing balance with supervision for functional task PT Short Term Goal 3: Perform gait x 50' with supervision with Left PFRW  Therex for bilateral lower extremities ROM, edema control and strengthening (5 # weight on right lower extremity) ankle pumps, heel slides, hip abduction/adduction, SAQ, LAQ, and isometric abduction/adduction seated. Dynamic standing balance activity for horseshoe toss with close supervision, cueing to maintain toe touch WB on LLE. Gait training with PFRW back to room x 150' with supervision, cues for TDWB on LLE.  Discussed pt progress with pt and wife and options for follow up therapy, recommending outpatient and pt/family in agreement.    Therapy/Group: Individual Therapy  Karolee Stamps [facility] 02/10/2011, 10:20 AM

## 2011-02-10 NOTE — Progress Notes (Signed)
Occupational Therapy Session Notes  Patient Details  Name: Bruce [name] MRN: [id number] Date of Birth: 07-21-1945  Today's Date: 02/10/2011  Precautions: Precautions Precautions: Fall Precaution Comments: weight bearing restricitions, see below Required Braces or Orthoses: Yes Other Brace/Splint: support splint for left wrist Restrictions Weight Bearing Restrictions: Yes LUE Weight Bearing: Non weight bearing LLE Weight Bearing: Touchdown weight bearing  Short Term Goals: OT Short Term Goal 1: Patient will bathe 10/10 body parts with minimal assistance OT Short Term Goal 2: Patient will complete LB dressing with minimal assistance OT Short Term Goal 3: Patient will perform toileting (perineal hygiene & clothing management) with supervision OT Short Term Goal 4: Patient will perform grooming tasks independently  ADL - See FIM  Session #1 4098-1191 - 60 Minutes Individual Therapy No complaints of pain Found patient supine in bed with pants donned. Therapist donned thigh length TED hose with patient standing to pull TEDs up over thighs. Skilled treatment focus on use of sock aid to donn bilateral socks and shoe horn to assist with donning bilateral shoes. Also focused on dynamic standing balance/endurance, overall activity tolerance/endurance, UB bathing & dressing seated in w/c at sink donning/doffing left support splint, and grooming tasks seated in w/c at sink.   Session #2 1330-1400 - 30 Minutes Individual Therapy No complaints of pain  Skilled treatment focus on functional ambulation/mobility in hallway and throughout ADL apartment. Also focused on IADL kitchen task. Patient at an overall supervision/set-up level for simple home management task.   Donnelle Olmeda 02/10/2011, 7:31 AM

## 2011-02-10 NOTE — Progress Notes (Signed)
Physical Therapy Session Note  Patient Details  Name: Bruce [name] MRN: [id number] Date of Birth: 12-24-1945  Today's Date: 02/10/2011 Time: 1330-1400 Time Calculation (min): 30 min  Precautions: Precautions Precautions: Fall Precaution Comments: weight bearing restricitions, see below Required Braces or Orthoses: Yes Other Brace/Splint: support splint for left wrist Restrictions Weight Bearing Restrictions: Yes LUE Weight Bearing: Non weight bearing LLE Weight Bearing: Touchdown weight bearing  Short Term Goals: PT Short Term Goal 1: Demonstrate functional transfers with supervision PT Short Term Goal 2: Demonstrate dynamic standing balance with supervision for functional task PT Short Term Goal 3: Perform gait x 41' with supervision with Left PFRW  Skilled Therapeutic Interventions/Progress Updates: w/c mobility with supervision x 120' focusing on longer stroke for more efficient propulsion.  Sitting therapeutic ex for bil LEs : 10 x 1:AA to active L hip flexion, AA LAQ, bil alternating ankle DF. Flexibility ex for LLE: kicking beach ball x 10 with increasing knee flexion excursion, in sitting.   In standing with PFRW, 10 x 1 LLE hip abduction, and LLE toe touch  to targets for increased motor control.  Pt benefited from coordinating breathing with effort of exs.  Gait training with PFRW x 125' with supervision, VCs for TDWB LLE,.      Pain Pain Assessment Pain Score:   6; premedicated      Therapy/Group: Individual Therapy  Nino Amano 02/10/2011, 3:51 PM

## 2011-02-11 NOTE — Progress Notes (Signed)
Physical Therapy Note  Patient Details  Name: Bruce [name] MRN: [id number] Date of Birth: 1945/12/02 Today's Date: 02/11/2011  1300-1400 (60 minutes) group Pain- 2/10 Lt LE- premedicated Focus of treatment: improve gait safety/endurance with PFRW on level/ 1 step Treatment: Pt participated in Gait group ambulating 60 feet/ 80 feet X 2 with PFRW SBA  TDWB Lt LE/NWB RT UE with intermittent cues to maintain weight bearing status Lt LE; up/down 1 step (curb) with PFRW SBA post instruction to enter home  1400-1425 (25 minutes) individual treatment session Pain - see above Focus of treatment- therapeutic activities to improve tolerance to activity Treatment: Nustep (crosstrainer) 2 X 5 minutes progressing from level 3 to level 4 (increasing resistance) . Perceived exertion 13 (somewhat hard)  Emelee Rodocker,JIM 02/11/2011, 2:39 PM

## 2011-02-11 NOTE — Progress Notes (Signed)
Physical Therapy Note  Patient Details  Name: Bruce [name] MRN: [id number] Date of Birth: 1946-01-09 Today's Date: 02/11/2011  7829-5621 (55 minutes) group treatment Pain- 3/10 Lt LE- nurse notified [facility] given Focus of treatment: Lt LE AROM/strengthening/ gait training in group setting Treatment: Transfers- SPT PFRW SBA with vcs/visual cues to maintain TDWB Lt LE; bilateral hip, knee , ankle strengthening exercises in supine X 20; Gait training- 40 feet X 2 PFRW TDWB Lt LE/NWB Lt wrist SBA with visual cues for TDWB   Kinesha Auten,JIM 02/11/2011, 10:02 AM

## 2011-02-11 NOTE — Progress Notes (Signed)
Patient ID: Bruce [name], male   DOB: Aug 24, 1945, 66 y.o.   MRN: [id number] Subjective/Complaints: Review of Systems  Musculoskeletal: Positive for joint pain (left leg and wirst).  All other systems reviewed and are negative.   1/26----better night.  Pain less  Objective: Vital Signs: Blood pressure 128/65, pulse 62, temperature 97.8 F (36.6 C), temperature source Oral, resp. rate 20, height 5\' 7"  (1.702 m), weight 120.748 kg (266 lb 3.2 oz), SpO2 99.00%. No results found. No results found for this basename: WBC:2,HGB:2,HCT:2,PLT:2 in the last 72 hours No results found for this basename: NA:2,K:2,CL:2,CO2:2,GLUCOSE:2,BUN:2,CREATININE:2,CALCIUM:2 in the last 72 hours CBG (last 3)  No results found for this basename: GLUCAP:3 in the last 72 hours  Wt Readings from Last 3 Encounters:  02/07/11 120.748 kg (266 lb 3.2 oz)  02/07/11 122.562 kg (270 lb 3.2 oz)  02/07/11 122.562 kg (270 lb 3.2 oz)    Physical Exam:  General appearance: alert, cooperative and no distress Head: Normocephalic, without obvious abnormality, atraumatic Eyes: conjunctivae/corneas clear. PERRL, EOM's intact. Fundi benign. Ears: normal TM's and external ear canals both ears Nose: Nares normal. Septum midline. Mucosa normal. No drainage or sinus tenderness. Throat: lips, mucosa, and tongue normal; teeth and gums normal Neck: no adenopathy, no carotid bruit, no JVD, supple, symmetrical, trachea midline and thyroid not enlarged, symmetric, no tenderness/mass/nodules Back: symmetric, no curvature. ROM normal. No CVA tenderness. Resp: clear to auscultation bilaterally Cardio: regular rate and rhythm, S1, S2 normal, no murmur, click, rub or gallop GI: soft, non-tender; bowel sounds normal; no masses,  no organomegaly Extremities: trace  edema on LLE. Pulses: 2+ and symmetric Skin: Skin color, texture, turgor normal. No rashes or lesions Neurologic: Grossly normal Incision/Wound: left leg incisions clean and  intact Exam updated 1/26   Assessment/Plan: 1. Functional deficits secondary to left femoral neck and shaft fx's s/p ORIF and ? Left triquetrum fx which require 3+ hours per day of interdisciplinary therapy in a comprehensive inpatient rehab setting. Physiatrist is providing close team supervision and 24 hour management of active medical problems listed below. Physiatrist and rehab team continue to assess barriers to discharge/monitor patient progress toward functional and medical goals.  TDWB LLE and NWB L wrist  Mobility:         ADL:   Cognition: Cognition Overall Cognitive Status: Appears within functional limits for tasks assessed Arousal/Alertness: Awake/alert Orientation Level: Oriented X4 Cognition Arousal/Alertness: Awake/alert Orientation Level: Oriented X4  1. DVT Prophylaxis/Anticoagulation: Pharmaceutical: Lovenox for 4 weeks post op  2. Pain Management: monitor on prn percocet and robaxin. Good control at present. 3. Mood: continue celexa.  4. ABLA: iron supplement. Recheck h/h monday 5. COPD: continue symbicort bid with albuterol prn SOB.  6. Bradycardia: monitor with bid checks. Heart rate still in the 50s.  atenolol as long as he's not symptomatic or HR doesn't drop below 50 7. Mild thrombocytopenia: Platelets normalized.   8. Ortho- tdwb thru left leg and nwb left wrist. He may propel w/c short dx only (less than 30 ft)   LOS (Days) 4 A FACE TO FACE EVALUATION WAS PERFORMED  Cendy Oconnor T 02/11/2011, 8:04 AM

## 2011-02-11 NOTE — Progress Notes (Signed)
Occupational Therapy Session Note  Patient Details  Name: Bruce [name] MRN: [id number] Date of Birth: 03/23/1945  Today's Date: 02/11/2011 Time: 1030-1115 Time Calculation (min): 45 min  Precautions: Precautions Precautions: Fall Precaution Comments: weight bearing restricitions, see below Required Braces or Orthoses: Yes Other Brace/Splint: support splint for left wrist Restrictions Weight Bearing Restrictions: Yes LUE Weight Bearing: Non weight bearing LLE Weight Bearing: Touchdown weight bearing  Short Term Goals: OT Short Term Goal 1: Patient will bathe 10/10 body parts with minimal assistance OT Short Term Goal 2: Patient will complete LB dressing with minimal assistance OT Short Term Goal 3: Patient will perform toileting (perineal hygiene & clothing management) with supervision OT Short Term Goal 4: Patient will perform grooming tasks independently  Skilled Therapeutic Interventions/Progress Updates:    Pt in w/c upon arrival and stated he had already completed bathing and dressing prior to PT earlier in morning.  Pt rolled self in w/c to ADL apt to practice tub bench transfers and bed transfers.  Pt amb to bathroom with PFRW for tub bench transfer.  Pt supervision for transfer into tub but required assistance lifting LLE out of tub.  Bed transfers at supervision.  Pt maintaining WBing restrictions without verbal cues.  Pain Pain Assessment Pain Assessment: No/denies pain   Therapy/Group: Individual Therapy  [name] 02/11/2011, 11:16 AM

## 2011-02-12 NOTE — Progress Notes (Signed)
Patient ID: Bruce [name], male   DOB: October 02, 1945, 66 y.o.   MRN: [id number] Patient ID: Bruce [name], male   DOB: 10/13/1945, 66 y.o.   MRN: [id number] Subjective/Complaints: Review of Systems  Musculoskeletal: Positive for joint pain (left leg and wirst).  All other systems reviewed and are negative.   1/27-no problems  Objective: Vital Signs: Blood pressure 128/71, pulse 53, temperature 98.4 F (36.9 C), temperature source Oral, resp. rate 18, height 5\' 7"  (1.702 m), weight 120.748 kg (266 lb 3.2 oz), SpO2 99.00%. No results found. No results found for this basename: WBC:2,HGB:2,HCT:2,PLT:2 in the last 72 hours No results found for this basename: NA:2,K:2,CL:2,CO2:2,GLUCOSE:2,BUN:2,CREATININE:2,CALCIUM:2 in the last 72 hours CBG (last 3)  No results found for this basename: GLUCAP:3 in the last 72 hours  Wt Readings from Last 3 Encounters:  02/07/11 120.748 kg (266 lb 3.2 oz)  02/07/11 122.562 kg (270 lb 3.2 oz)  02/07/11 122.562 kg (270 lb 3.2 oz)    Physical Exam:  General appearance: alert, cooperative and no distress Head: Normocephalic, without obvious abnormality, atraumatic Eyes: conjunctivae/corneas clear. PERRL, EOM's intact. Fundi benign. Ears: normal TM's and external ear canals both ears Nose: Nares normal. Septum midline. Mucosa normal. No drainage or sinus tenderness. Throat: lips, mucosa, and tongue normal; teeth and gums normal Neck: no adenopathy, no carotid bruit, no JVD, supple, symmetrical, trachea midline and thyroid not enlarged, symmetric, no tenderness/mass/nodules Back: symmetric, no curvature. ROM normal. No CVA tenderness. Resp: clear to auscultation bilaterally Cardio: regular rate and rhythm, S1, S2 normal, no murmur, click, rub or gallop GI: soft, non-tender; bowel sounds normal; no masses,  no organomegaly Extremities: trace  edema on LLE. Pulses: 2+ and symmetric Skin: Skin color, texture, turgor normal. No rashes or lesions Neurologic:  Grossly normal Incision/Wound: left leg incisions clean. Still some central scab Exam updated 1/27   Assessment/Plan: 1. Functional deficits secondary to left femoral neck and shaft fx's s/p ORIF and ? Left triquetrum fx which require 3+ hours per day of interdisciplinary therapy in a comprehensive inpatient rehab setting. Physiatrist is providing close team supervision and 24 hour management of active medical problems listed below. Physiatrist and rehab team continue to assess barriers to discharge/monitor patient progress toward functional and medical goals.  TDWB LLE and NWB L wrist  Mobility:     Ambulation/Gait Ambulation/Gait Assistance: 5: Supervision   ADL:   Cognition: Cognition Overall Cognitive Status: Appears within functional limits for tasks assessed Arousal/Alertness: Awake/alert Orientation Level: Oriented X4 Cognition Arousal/Alertness: Awake/alert Orientation Level: Oriented X4  1. DVT Prophylaxis/Anticoagulation: Pharmaceutical: Lovenox for 4 weeks post op-need to set up home lovenox 2. Pain Management: monitor on prn percocet and robaxin. Good control at present. 3. Mood: continue celexa.  4. ABLA: iron supplement. Recheck h/h monday 5. COPD: continue symbicort bid with albuterol prn SOB.  6. Bradycardia: monitor with bid checks. Heart rate still in the 50s.  atenolol as long as he's not symptomatic or HR doesn't drop below 50 7. Mild thrombocytopenia: Platelets normalized.   8. Ortho- tdwb thru left leg and nwb left wrist. He may propel w/c short dx only (less than 30 ft)  -continue with staples for now. Likely remove them at d/c   LOS (Days) 5 A FACE TO FACE EVALUATION WAS PERFORMED  Jancy Sprankle T 02/12/2011, 7:03 AM

## 2011-02-12 NOTE — Progress Notes (Signed)
Physical Therapy Session Note  Patient Details  Name: Bruce [name] MRN: [id number] Date of Birth: [birth year]  Today's Date: 02/12/2011 Time: 1305-1400 Time Calculation (min): 55 min  Precautions: Precautions Precautions: Fall Precaution Comments: weight bearing restricitions, see below Required Braces or Orthoses: Yes Other Brace/Splint: support splint for left wrist Restrictions Weight Bearing Restrictions: Yes LUE Weight Bearing: Non weight bearing LLE Weight Bearing: Touchdown weight bearing  Short Term Goals: PT Short Term Goal 1: Demonstrate functional transfers with supervision PT Short Term Goal 2: Demonstrate dynamic standing balance with supervision for functional task PT Short Term Goal 3: Perform gait x 50' with supervision with Left PFRW   Denies pain.  Pt participated in group therapy for dynamic gait training through obstacle course negotiating turns/obstacles and walking on compliant surface with PFRW x 2. Therapeutic activity for dynamic standing balance, functional strengthening with sit to stands without AD, and socialization for bean bag toss with other patients in group. Good participation and supervision level overall. Gait with PFRW to/from group session > 150' with supervision, cueing for WB technique on LLE.      Therapy/Group: Group Therapy  Karolee Stamps [facility] 02/12/2011, 4:29 PM

## 2011-02-12 NOTE — Progress Notes (Signed)
Orthopedic Tech Progress Note Patient Details:  Bruce [name] 12-19-1945 [id number]  Patient ID: Bruce [name], male   DOB: 16-Aug-1945, 66 y.o.   MRN: [id number]   Bruce [name] 02/12/2011, 1:09 PM Splint removed by Astrid Divine. Ortho tech replaced with wrist splint

## 2011-02-13 NOTE — Progress Notes (Signed)
Subjective/Complaints: Review of Systems  Musculoskeletal: Positive for joint pain (left leg and wirst).  All other systems reviewed and are negative.   1/28- no issues per pt.  Wife concerned about pain levels at times.  Objective: Vital Signs: Blood pressure 107/57, pulse 47, temperature 97.5 F (36.4 C), temperature source Oral, resp. rate 18, height 5\' 7"  (1.702 m), weight 120.748 kg (266 lb 3.2 oz), SpO2 97.00%. No results found. No results found for this basename: WBC:2,HGB:2,HCT:2,PLT:2 in the last 72 hours No results found for this basename: NA:2,K:2,CL:2,CO2:2,GLUCOSE:2,BUN:2,CREATININE:2,CALCIUM:2 in the last 72 hours CBG (last 3)  No results found for this basename: GLUCAP:3 in the last 72 hours  Wt Readings from Last 3 Encounters:  02/07/11 120.748 kg (266 lb 3.2 oz)  02/07/11 122.562 kg (270 lb 3.2 oz)  02/07/11 122.562 kg (270 lb 3.2 oz)    Physical Exam:  General appearance: alert, cooperative and no distress Head: Normocephalic, without obvious abnormality, atraumatic Eyes: conjunctivae/corneas clear. PERRL, EOM's intact. Fundi benign. Ears: normal TM's and external ear canals both ears Nose: Nares normal. Septum midline. Mucosa normal. No drainage or sinus tenderness. Throat: lips, mucosa, and tongue normal; teeth and gums normal Neck: no adenopathy, no carotid bruit, no JVD, supple, symmetrical, trachea midline and thyroid not enlarged, symmetric, no tenderness/mass/nodules Back: symmetric, no curvature. ROM normal. No CVA tenderness. Resp: clear to auscultation bilaterally Cardio: regular rate and rhythm, S1, S2 normal, no murmur, click, rub or gallop GI: soft, non-tender; bowel sounds normal; no masses,  no organomegaly Extremities: trace  edema on LLE. Pulses: 2+ and symmetric Skin: Skin color, texture, turgor normal. No rashes or lesions Neurologic: Grossly normalLLE 3+/5 prox to 5/5 distally Incision/Wound: left leg incisions clean. Still some central  scab. Left wrist only tender a bit along distal radius. Exam updated 1/28   Assessment/Plan: 1. Functional deficits secondary to left femoral neck and shaft fx's s/p ORIF and ? Left triquetrum fx which require 3+ hours per day of interdisciplinary therapy in a comprehensive inpatient rehab setting. Physiatrist is providing close team supervision and 24 hour management of active medical problems listed below. Physiatrist and rehab team continue to assess barriers to discharge/monitor patient progress toward functional and medical goals.  TDWB LLE and NWB L wrist  Mobility:     Ambulation/Gait Ambulation/Gait Assistance: 5: Supervision   ADL:   Cognition: Cognition Overall Cognitive Status: Appears within functional limits for tasks assessed Arousal/Alertness: Awake/alert Orientation Level: Oriented X4 Cognition Arousal/Alertness: Awake/alert Orientation Level: Oriented X4  1. DVT Prophylaxis/Anticoagulation: Pharmaceutical: Lovenox for 4 weeks post op-need to set up home lovenox 2. Pain Management: monitor on prn percocet and robaxin. Discussed with patient and wife regarding pain meds and use of them with activities. He understands and is ok with current pain control. 3. Mood: continue celexa.  4. ABLA: iron supplement. Recheck h/h tomorrow 5. COPD: continue symbicort bid with albuterol prn SOB.  6. Bradycardia: monitor with bid checks. Heart rate still in the 50s.  atenolol as long as he's not symptomatic or HR doesn't drop below 50 (47 today---observe) 7. Mild thrombocytopenia: Platelets normalized.   8. Ortho- tdwb thru left leg and nwb left wrist. He may propel w/c short dx only (less than 30 ft)  -continue with staples for now. Likely remove them at d/c   LOS (Days) 6 A FACE TO FACE EVALUATION WAS PERFORMED  Alvan Culpepper T 02/13/2011, 6:46 AM

## 2011-02-13 NOTE — Progress Notes (Signed)
Physical Therapy Session Note  Patient Details  Name: NEELY CECENA MRN: [id number] Date of Birth: 1945-07-31  Today's Date: 02/13/2011 Time: 1191-4782 Time Calculation (min): 30 min  Precautions: Precautions Precautions: Fall Precaution Comments: weight bearing restricitions, see below Required Braces or Orthoses: Yes Other Brace/Splint: support splint for left wrist Restrictions Weight Bearing Restrictions: Yes LUE Weight Bearing: Non weight bearing LLE Weight Bearing: Touchdown weight bearing  Short Term Goals: PT Short Term Goal 1: Demonstrate functional transfers with supervision PT Short Term Goal 1 - Progress: Met PT Short Term Goal 2: Demonstrate dynamic standing balance with supervision for functional task PT Short Term Goal 2 - Progress: Met PT Short Term Goal 3: Perform gait x 51' with supervision with Left PFRW PT Short Term Goal 3 - Progress: Met  Pain - no complaints.    Treatment session focused on family education for real car transfer. Pt wife and grandson (& grandson's gf present) observed and education completed on safety regarding transfer. Pt completed with PFRW at modified independent level, no cueing needed and very safe into and out of truck. Pt able to manage LLE independently as well.  Nustep on level 5 x 10 minutes for ROM and strengthening, supervision transfers overall today. Good participation and pt encouraged with progress.  Therapy/Group: Individual Therapy  Karolee Stamps [facility] 02/13/2011, 4:37 PM

## 2011-02-13 NOTE — Progress Notes (Signed)
Occupational Therapy Session Notes  Patient Details  Name: Bruce [name] MRN: [id number] Date of Birth: 04-08-1945  Today's Date: 02/13/2011  Precautions: Precautions Precautions: Fall Precaution Comments: weight bearing restricitions, see below Required Braces or Orthoses: Yes Other Brace/Splint: support splint for left wrist Restrictions Weight Bearing Restrictions: Yes LUE Weight Bearing: Non weight bearing LLE Weight Bearing: Touchdown weight bearing  Short Term Goals: OT Short Term Goal 1: Patient will bathe 10/10 body parts with minimal assistance OT Short Term Goal 2: Patient will complete LB dressing with minimal assistance OT Short Term Goal 3: Patient will perform toileting (perineal hygiene & clothing management) with supervision OT Short Term Goal 4: Patient will perform grooming tasks independently   ADL - See FIM  Session #1 [id number] - 45 Minutes Individual Therapy No complaints of pain Upon entering room, patient seated in w/c eating breakfast. Discussed TED hose wearing schedule and purpose with patient and wife. Demonstrated donning TED hose and had wife return deonstrate with donning hose. Engaged in functional ambulation -> ADL apartment using platform rolling walker for simulated tub/shower transfer <-> tub transfer bench with minimal assist from therapist getting out of tub/shower. Discussed home safety and use of DME and AE prn. Patient reports, his son plans to install elevated toilet seats in house bathrooms. Recommend tub transfer bench for shower equipment to increase independence with transfers and for safety.   Session #2 1400-1500 - 60 Minutes Individual Therapy No complaints of pain Engaged in ADL retraining at shower level; incision covered. Focused skilled session on shower transfer (not simulated), functional ambulation around room using platform rolling walker, UB/LB bathing using AE prn to increase independence, UB/LB dressing using AE prn to  increase independence. Patients wife present during entire session, she tends to try to assist too much - even when patient stating he can do it himself.    [name] 02/13/2011, 8:33 AM

## 2011-02-13 NOTE — Progress Notes (Signed)
Physical Therapy Session Note  Patient Details  Name: Bruce [name] MRN: [id number] Date of Birth: 1945/12/17  Today's Date: 02/13/2011 Time: 0825-0920 Time Calculation (min): 55 min  Precautions: Precautions Precautions: Fall Precaution Comments: weight bearing restricitions, see below Required Braces or Orthoses: Yes Other Brace/Splint: support splint for left wrist Restrictions Weight Bearing Restrictions: Yes LUE Weight Bearing: Non weight bearing LLE Weight Bearing: Touchdown weight bearing  Short Term Goals: PT Short Term Goal 1: Demonstrate functional transfers with supervision PT Short Term Goal 2: Demonstrate dynamic standing balance with supervision for functional task PT Short Term Goal 3: Perform gait x 50' with supervision with Left PFRW    Premedicated for pain, no complaints. Stair training with PFRW up/down curb step for home entry x 3 reps, supervision overall - pt reports feeling comfortable with this. ROM and strengthening for heel slides (using sheet for pt to assist with ROM), hip abduction/adduction and SAQ in supine x 15 reps x 2 sets bilateral. Seated heel slides to promote flexion x 15 reps x 2 sets on RLE. Gait training with PFRW overall supervision for general strengthening and activity tolerance x 100'.   Son plan to bring truck this afternoon to practice real car transfer.  Therapy/Group: Individual Therapy  Karolee Stamps [facility] 02/13/2011, 9:26 AM

## 2011-02-14 DIAGNOSIS — W11XXXA Fall on and from ladder, initial encounter: Secondary | ICD-10-CM

## 2011-02-14 DIAGNOSIS — Z5189 Encounter for other specified aftercare: Secondary | ICD-10-CM

## 2011-02-14 DIAGNOSIS — S62109A Fracture of unspecified carpal bone, unspecified wrist, initial encounter for closed fracture: Secondary | ICD-10-CM

## 2011-02-14 DIAGNOSIS — S72309A Unspecified fracture of shaft of unspecified femur, initial encounter for closed fracture: Secondary | ICD-10-CM

## 2011-02-14 DIAGNOSIS — S72143A Displaced intertrochanteric fracture of unspecified femur, initial encounter for closed fracture: Secondary | ICD-10-CM

## 2011-02-14 NOTE — Progress Notes (Signed)
Patient ID: Bruce [name], male   DOB: [birth year], 66 y.o.   MRN: [id number] Clinical update faxed to [facility] at [place], including team conference notes and progress notes.

## 2011-02-14 NOTE — Progress Notes (Signed)
Patient ID: Bruce [name], male   DOB: 02-11-1945, 66 y.o.   MRN: [id number] Subjective/Complaints: Review of Systems  Musculoskeletal: Positive for joint pain (left leg and wirst).  All other systems reviewed and are negative.   1/29----pain controlled.  HR lower at night  Objective: Vital Signs: Blood pressure 129/61, pulse 50, temperature 98.4 F (36.9 C), temperature source Oral, resp. rate 18, height 5\' 7"  (1.702 m), weight 120.748 kg (266 lb 3.2 oz), SpO2 97.00%. No results found.  Basename 02/14/11 0640  WBC --  HGB 10.4*  HCT 31.6*  PLT --   No results found for this basename: NA:2,K:2,CL:2,CO2:2,GLUCOSE:2,BUN:2,CREATININE:2,CALCIUM:2 in the last 72 hours CBG (last 3)  No results found for this basename: GLUCAP:3 in the last 72 hours  Wt Readings from Last 3 Encounters:  02/07/11 120.748 kg (266 lb 3.2 oz)  02/07/11 122.562 kg (270 lb 3.2 oz)  02/07/11 122.562 kg (270 lb 3.2 oz)    Physical Exam:  General appearance: alert, cooperative and no distress Head: Normocephalic, without obvious abnormality, atraumatic Eyes: conjunctivae/corneas clear. PERRL, EOM's intact. Fundi benign. Ears: normal TM's and external ear canals both ears Nose: Nares normal. Septum midline. Mucosa normal. No drainage or sinus tenderness. Throat: lips, mucosa, and tongue normal; teeth and gums normal Neck: no adenopathy, no carotid bruit, no JVD, supple, symmetrical, trachea midline and thyroid not enlarged, symmetric, no tenderness/mass/nodules Back: symmetric, no curvature. ROM normal. No CVA tenderness. Resp: clear to auscultation bilaterally Cardio: regular rate and rhythm, S1, S2 normal, no murmur, click, rub or gallop GI: soft, non-tender; bowel sounds normal; no masses,  no organomegaly Extremities: trace  edema on LLE. Pulses: 2+ and symmetric Skin: Skin color, texture, turgor normal. No rashes or lesions Neurologic: Grossly normalLLE 3+/5 prox to 5/5 distally Incision/Wound: left  leg incisions clean. Still some central scab. Left wrist only tender a bit along distal radius. Exam updated 1/29   Assessment/Plan: 1. Functional deficits secondary to left femoral neck and shaft fx's s/p ORIF and ? Left triquetrum fx which require 3+ hours per day of interdisciplinary therapy in a comprehensive inpatient rehab setting. Physiatrist is providing close team supervision and 24 hour management of active medical problems listed below. Physiatrist and rehab team continue to assess barriers to discharge/monitor patient progress toward functional and medical goals.  TDWB LLE and NWB L wrist  Mobility:     Ambulation/Gait Ambulation/Gait Assistance: 5: Supervision   ADL:   Cognition: Cognition Overall Cognitive Status: Appears within functional limits for tasks assessed Arousal/Alertness: Awake/alert Orientation Level: Oriented X4 Cognition Arousal/Alertness: Awake/alert Orientation Level: Oriented X4  1. DVT Prophylaxis/Anticoagulation: Pharmaceutical: Lovenox for 4 weeks post op-need to set up home lovenox 2. Pain Management: monitor on prn percocet and robaxin. Discussed with patient and wife regarding pain meds and use of them with activities. He understands and is ok with current pain control. 3. Mood: continue celexa.  4. ABLA: iron supplement. Recheck h/h tomorrow 5. COPD: continue symbicort bid with albuterol prn SOB.  6. Bradycardia: monitor with bid checks. Heart rate still in the 50s.  atenolol as long as he's not symptomatic or HR doesn't drop below 50 routinely.  Could consider titrating it down as an outpt. 7. Mild thrombocytopenia: Platelets normalized.   8. Ortho- tdwb thru left leg and nwb left wrist. He may propel w/c short dx only (less than 30 ft)  -continue with staples for now. Likely remove them at d/c   LOS (Days) 7 A FACE TO FACE EVALUATION WAS  PERFORMED  SWARTZ,ZACHARY T 02/14/2011, 7:33 AM

## 2011-02-14 NOTE — Progress Notes (Signed)
Patient Details  Name: [name] MRN: [id number] Date of Birth: [birth year]  Today's Date: 02/14/2011 Time: 1030-12 Skilled Therapeutic Interventions/Progress Updates: Community Outing to [employer] ambulatory level with PFRW at overall supervision level.  Pt demonstrated good safety awareness, was able to ID obstacles and negotiate them without assist.  Pt also able to identify opportunities for energy conservation independently.  No c/o pain. Therapy/Group: Community Reintegration  Activity Level: Moderate:  Level of assist: Supervision  Zyaira Vejar 02/14/2011, 4:45 PM

## 2011-02-14 NOTE — Patient Care Conference (Signed)
Inpatient RehabilitationTeam Conference Note Date: 02/14/2011   Time: 1:40 PM    Patient Name: Bruce [name]      Medical Record Number: [id number]  Date of Birth: 07-05-1945 Sex: Male         Room/Bed: 4006/4006-01 Payor Info: Payor: BLUE CROSS BLUE SHIELD  Plan: BCBS [place] PPO  Product Type: *No Product type*     Admitting Diagnosis: lt hip fx,lt wrist fx  Admit Date/Time:  02/07/2011 11:36 AM Admission Comments: No comment available   Primary Diagnosis:  Closed left subtrochanteric femur fracture Principal Problem: Closed left subtrochanteric femur fracture  Patient Active Problem List  Diagnoses Date Noted  . Closed left subtrochanteric femur fracture 02/07/2011  . Wrist fracture, left 02/07/2011  . Bradycardia 02/05/2011  . Depression 02/04/2011  . Confusion 02/04/2011  . Hyponatremia 02/03/2011  . Anemia 02/02/2011  . COPD (chronic obstructive pulmonary disease) 01/31/2011  . Hip fracture, intertrochanteric 01/31/2011  . Wrist fracture 01/31/2011  . Thrombocytopenia 01/31/2011  . GOUT, UNSPECIFIED 02/15/2009  . Eosinophilia 02/15/2009  . ALLERGIC RHINITIS, SEASONAL 02/15/2009  . RECTAL BLEEDING 02/15/2009  . ARTHRITIS 02/15/2009    Expected Discharge Date: Expected Discharge Date: 02/16/11  Team Members Present: Physician: Dr. Faith Rogue Case Manager Present: Lutricia Horsfall, RN Social Worker Present: Dossie Der, LCSW PT Present: Vincente Liberty, PTA OT Present: Edwin Cap, OT RN: Briant Sites, RN     Current Status/Progress Goal Weekly Team Focus  Medical   pain issues. wrist feeling better  wound still with some drainage but healing.  pain control and adherence to ortho precautions  see above   Bowel/Bladder     Continent   Remain continent     Swallow/Nutrition/ Hydration     n/a        ADL's   overall supervision -> modified independent  overall modified independent  ADL retraining using AE prn, functional mobility using platform RW, and  activity tolerance/endurance   Mobility   supervision, approaching modified independent  modified indpendent overall ambulatory level  dynamic gait and balance, d/c planning, family education as needed.   Communication     n/a        Safety/Cognition/ Behavioral Observations    ortho precautions:  TDWB-LLE and  NWB- LUE   Remain compliant     Pain     Pain at 1-2 of L leg and wrist contorlled with PRN pain med   Pain > 3     Skin     Incision clean and dry, staples intact   remove staples prior to d/c        *See Interdisciplinary Assessment and Plan and progress notes for long and short-term goals  Barriers to Discharge: none    Possible Resolutions to Barriers:  none    Discharge Planning/Teaching Needs:  Home with wife who can provide assist. Wife here daily and attends therapies with pt.      Team Discussion:  Discussion of injuries. Wounds healing. Pain controlled. Remains on ortho precautions. Ready for d/c 1/31. Lovenox education needed.  Revisions to Treatment Plan:  none   Continued Need for Acute Rehabilitation Level of Care: The patient requires daily medical management by a physician with specialized training in physical medicine and rehabilitation for the following conditions: Daily direction of a multidisciplinary physical rehabilitation program to ensure safe treatment while eliciting the highest outcome that is of practical value to the patient.: Yes Daily medical management of patient stability for increased activity during participation in an intensive  rehabilitation regime.: Yes Daily analysis of laboratory values and/or radiology reports with any subsequent need for medication adjustment of medical intervention for : Post surgical problems;Other  Meryl Dare 02/14/2011, 1:55 PM

## 2011-02-14 NOTE — Progress Notes (Signed)
Social Work Patient ID: Bruce [name], male   DOB: 1945-12-31, 66 y.o.   MRN: [id number]  Met with pt and wife to inform of team conference-goals-mod/i level and discharge date-1/31. Discussed Team's recommendations tub bench and follow up OP.  Aware of OP in [place] will set up First appt.  Wife had questions regarding ted hose, driving, and going back to work.  Referred her back to  Ortho MD who they will be following up with at discharge.  Agreeable to recommendations and glad to be going Home.

## 2011-02-14 NOTE — Progress Notes (Signed)
Physical Therapy Session Note  Patient Details  Name: TAISHAWN SMALDONE MRN: [id number] Date of Birth: December 24, 1945  Today's Date: 02/14/2011 Time: 1030-1200 Time Calculation (min): 90 min  Precautions: Precautions Precautions: Fall Precaution Comments: weight bearing restricitions, see below Required Braces or Orthoses: Yes Other Brace/Splint: support splint for left wrist Restrictions Weight Bearing Restrictions: Yes LUE Weight Bearing: Non weight bearing LLE Weight Bearing: Touchdown weight bearing  Short Term Goals: PT Short Term Goal 1: Demonstrate functional transfers with supervision PT Short Term Goal 1 - Progress: Met PT Short Term Goal 2: Demonstrate dynamic standing balance with supervision for functional task PT Short Term Goal 2 - Progress: Met PT Short Term Goal 3: Perform gait x 54' with supervision with Left PFRW PT Short Term Goal 3 - Progress: Met  Denies pain. Pt participated in outing to a restaurant for community re-integration. Focus on education and safety with mobility, energy conservation techniques, and discussed community mobility. Pt performed at overall modified independent/supervision level with PFRW.   Therapy/Group: Mal Misty 02/14/2011, 1:11 PM

## 2011-02-14 NOTE — Progress Notes (Signed)
Occupational Therapy Session Notes  Patient Details  Name: Bruce [name] MRN: [id number] Date of Birth: October 20, 1945  Today's Date: 02/14/2011  Precautions: Precautions Precautions: Fall Precaution Comments: weight bearing restricitions, see below Required Braces or Orthoses: Yes Other Brace/Splint: support splint for left wrist Restrictions Weight Bearing Restrictions: Yes LUE Weight Bearing: Non weight bearing LLE Weight Bearing: Touchdown weight bearing  Short Term Goals: OT Short Term Goal 1: Patient will bathe 10/10 body parts with minimal assistance OT Short Term Goal 2: Patient will complete LB dressing with minimal assistance OT Short Term Goal 3: Patient will perform toileting (perineal hygiene & clothing management) with supervision OT Short Term Goal 4: Patient will perform grooming tasks independently  ADL - See FIM  Session #1 [id number] - 45 Minutes Individual Therapy No complaints of pain Treatment emphasis on ADL retraining at shower level with use of AE prn to increase overall independence. Focused skilled intervention on functional ambulation throughout room using platform rolling walker, use of AE prn, dynamic standing balance/endurance, increasing overall activity tolerance/endurance, and grooming tasks seated in w/c at sink.   Session #2 1300-1330 - 30 Minutes Individual Therapy No complaints of pain Therapeutic exercise focusing on functional ambulation using platform rolling walker, increasing overall activity tolerance/endurance, and ROM and strengthening throughout bilateral upper & lower extremities.   Kamareon Sciandra 02/14/2011, 7:57 AM

## 2011-02-14 NOTE — Progress Notes (Signed)
Patient ID: Bruce [name], male   DOB: Jul 11, 1945, 66 y.o.   MRN: [id number] Met with pt's wife to explain that pt will need Lovenox after discharge. Pt's wife tearful, saying that they cannot afford the medications. Called pt's insurance co. to request info on pt's pharmacy benefits. Provided pt's wife with info on expected cost of meds post d/c. Requested SW to consult with pt's wife re: financial concerns. Requested nursing to provide education on Lovenox. SW met with pt and wife to review team conference.

## 2011-02-15 DIAGNOSIS — W11XXXA Fall on and from ladder, initial encounter: Secondary | ICD-10-CM

## 2011-02-15 DIAGNOSIS — S62109A Fracture of unspecified carpal bone, unspecified wrist, initial encounter for closed fracture: Secondary | ICD-10-CM

## 2011-02-15 DIAGNOSIS — S72309A Unspecified fracture of shaft of unspecified femur, initial encounter for closed fracture: Secondary | ICD-10-CM

## 2011-02-15 DIAGNOSIS — S72143A Displaced intertrochanteric fracture of unspecified femur, initial encounter for closed fracture: Secondary | ICD-10-CM

## 2011-02-15 DIAGNOSIS — Z5189 Encounter for other specified aftercare: Secondary | ICD-10-CM

## 2011-02-15 NOTE — Progress Notes (Signed)
Subjective/Complaints: Review of Systems  Musculoskeletal: Positive for joint pain (left leg and wirst).  All other systems reviewed and are negative.   1/30-no problems.  Pain controlled  Objective: Vital Signs: Blood pressure 105/52, pulse 48, temperature 98.1 F (36.7 C), temperature source Oral, resp. rate 18, height 5\' 7"  (1.702 m), weight 120.748 kg (266 lb 3.2 oz), SpO2 97.00%. No results found.  Basename 02/14/11 0640  WBC --  HGB 10.4*  HCT 31.6*  PLT --   No results found for this basename: NA:2,K:2,CL:2,CO2:2,GLUCOSE:2,BUN:2,CREATININE:2,CALCIUM:2 in the last 72 hours CBG (last 3)  No results found for this basename: GLUCAP:3 in the last 72 hours  Wt Readings from Last 3 Encounters:  02/07/11 120.748 kg (266 lb 3.2 oz)  02/07/11 122.562 kg (270 lb 3.2 oz)  02/07/11 122.562 kg (270 lb 3.2 oz)    Physical Exam:  General appearance: alert, cooperative and no distress Head: Normocephalic, without obvious abnormality, atraumatic Eyes: conjunctivae/corneas clear. PERRL, EOM's intact. Fundi benign. Ears: normal TM's and external ear canals both ears Nose: Nares normal. Septum midline. Mucosa normal. No drainage or sinus tenderness. Throat: lips, mucosa, and tongue normal; teeth and gums normal Neck: no adenopathy, no carotid bruit, no JVD, supple, symmetrical, trachea midline and thyroid not enlarged, symmetric, no tenderness/mass/nodules Back: symmetric, no curvature. ROM normal. No CVA tenderness. Resp: clear to auscultation bilaterally Cardio: regular rate and rhythm, S1, S2 normal, no murmur, click, rub or gallop GI: soft, non-tender; bowel sounds normal; no masses,  no organomegaly Extremities: trace  edema on LLE. Pulses: 2+ and symmetric Skin: Skin color, texture, turgor normal. No rashes or lesions Neurologic: Grossly normalLLE 3+/5 prox to 5/5 distally Incision/Wound: left leg incisions clean. Central staples still in place. Left wrist only tender a bit along  distal radius. Exam updated 1/30   Assessment/Plan: 1. Functional deficits secondary to left femoral neck and shaft fx's s/p ORIF and ? Left triquetrum fx which require 3+ hours per day of interdisciplinary therapy in a comprehensive inpatient rehab setting. Physiatrist is providing close team supervision and 24 hour management of active medical problems listed below. Physiatrist and rehab team continue to assess barriers to discharge/monitor patient progress toward functional and medical goals.  TDWB LLE and NWB L wrist  Mobility:     Ambulation/Gait Ambulation/Gait Assistance: 5: Supervision   ADL:   Cognition: Cognition Overall Cognitive Status: Appears within functional limits for tasks assessed Arousal/Alertness: Awake/alert Orientation Level: Oriented X4 Cognition Arousal/Alertness: Awake/alert Orientation Level: Oriented X4  1. DVT Prophylaxis/Anticoagulation: Pharmaceutical: Lovenox for 4 weeks post op 2. Pain Management: monitor on prn percocet and robaxin. Discussed with patient and wife regarding pain meds and use of them with activities. He understands and is ok with current pain control. 3. Mood: continue celexa.  4. ABLA: iron supplement. hgb 10.4 yesterday 5. COPD: continue symbicort bid with albuterol prn SOB.  6. Bradycardia: monitor with bid checks. Heart rate still in the low 50s. Continue atenolol as long as he's not symptomatic or HR doesn't drop below 50 routinely.  Could consider titrating it down as an outpt. 7. Mild thrombocytopenia: Platelets normalized.   8. Ortho- tdwb thru left leg and nwb left wrist. He may propel w/c short dx only (less than 30 ft)  -remove remaining staples tomorrow   LOS (Days) 8 A FACE TO FACE EVALUATION WAS PERFORMED  SWARTZ,ZACHARY T 02/15/2011, 8:22 AM

## 2011-02-15 NOTE — Progress Notes (Signed)
Patient alert and oriented x 4 calls appropriately, and able to make needs known. Left hip incision with staples in middle of incision a scant amount of drainage noted- dressing changed placed 4x4 with ABD pad, and mepliex tape. Ambulates with platform walker supervision. Given Percocet at 0953, and Tylenol at 1630 to manage pain.Will continue with plan of care.

## 2011-02-15 NOTE — Progress Notes (Signed)
Physical Therapy Session Note  Patient Details  Name: Bruce [name] MRN: [id number] Date of Birth: [birth year]  Today's Date: 02/15/2011 Time: 0830-0926 Time Calculation (min): 56 min  Precautions: Precautions Precautions: Fall Precaution Comments: weight bearing restricitions, see below Required Braces or Orthoses: Yes Other Brace/Splint: support splint for L wrist Restrictions Weight Bearing Restrictions: Yes LUE Weight Bearing: Non weight bearing LLE Weight Bearing: Touchdown weight bearing  Short Term Goals: PT Short Term Goal 1: Demonstrate functional transfers with supervision PT Short Term Goal 1 - Progress: Met PT Short Term Goal 2: Demonstrate dynamic standing balance with supervision for functional task PT Short Term Goal 2 - Progress: Met PT Short Term Goal 3: Perform gait x 61' with supervision with Left PFRW PT Short Term Goal 3 - Progress: Met   Pain Pain Assessment Pain Assessment: 0-10 Pain Score:   2 Pain Type: Surgical pain Pain Location: Hip Pain Orientation: Left Pain Descriptors: Sharp Pain Onset: Gradual Pain Intervention(s): Medication (See eMAR) Mobility Transfers Transfers: Yes Stand Pivot Transfers: 6: Modified independent (Device/Increase time) Locomotion  Ambulation Ambulation: Yes Ambulation/Gait Assistance: 6: Modified independent (Device/Increase time) Ambulation Distance (Feet): 150 Feet Assistive device: Left platform walker Wheelchair Mobility Wheelchair Mobility: Yes Wheelchair Assistance: 6: Modified independent (Device/Increase time) Wheelchair Parts Management: Independent Distance: 150  Trunk/Postural Assessment  Cervical Assessment Cervical Assessment: Within Functional Limits Thoracic Assessment Thoracic Assessment: Within Functional Limits Lumbar Assessment Lumbar Assessment: Within Functional Limits Postural Control Postural Control: Within Functional Limits  Balance Static Sitting Balance Static Sitting -  Level of Assistance: 7: Independent Dynamic Sitting Balance Dynamic Sitting - Level of Assistance: 6: Modified independent (Device/Increase time) Static Standing Balance Static Standing - Level of Assistance: 6: Modified independent (Device/Increase time) Dynamic Standing Balance Dynamic Standing - Level of Assistance: 6: Modified independent (Device/Increase time)   Exercises: Gait with PFRW room to gym for and W/c propulsion back to room using RLE and R hand to propel, for general activity tolerance. Therapeutic ex on NuStep for generalized strengthening and L knee flexion flexibility, at level 5 x 13 minutes, rated 13 on Borg exertion scale.         Therapy/Group: Individual Therapy  Heatherly Stenner 02/15/2011, 11:39 AM

## 2011-02-15 NOTE — Progress Notes (Signed)
Occupational Therapy Session Notes & Discharge Summary  Patient Details  Name: Bruce [name] MRN: [id number] Date of Birth: [birth year] Today's Date: 02/15/2011  SESSION NOTES  Session #1 [phone] - 45 Minutes Individual Therapy No complaints of pain Engaged in ADL retraining at shower level. Focused skilled OT on functional mobility/ambulation throughout room using platform rolling walker, shower stall transfer, UB/LB bathing, UB/LB dressing, bed mobility, dynamic standing balance/endurance, and grooming tasks seated at sink in w/c. Notified nursing of wet dressing along incision after shower. Left patient seated in w/c to eat breakfast.   Session #2 1300-1400 - 60 Minutes Individual Therapy No complaints of pain Therapeutic activity focusing on functional ambulation/mobility using platform rolling walker throughout hospital gift shop. Therapeutic exercise focusing on UE & LE strengthening and ROM exercises. Discussed discharge safety and exercises for home use. Patient and wife excited to discharge home tomorrow.   DISCHARGE SUMMARY  Patient has met 9 of 9 long term goals due to improved activity tolerance, improved balance, ability to compensate for deficits, functional use of  LEFT upper and LEFT lower extremity, improved awareness and improved coordination.  Patient's care partner is independent to provide the necessary supervision prn at discharge.  Patient plans to discharge -> home with wife who will be able to provide intermittent supervision prn.   See FIM and discharge navigator for objective and subjective measures.  Recommendation:  No additional OT recommended at this time.  Equipment: Tub Advertising copywriter. Patient reports he recently had handicap/elevated toilet seats installed  Patient/family agrees with progress made and goals achieved: Yes  [name] 02/15/2011, 7:44 AM

## 2011-02-16 MED ORDER — ENOXAPARIN SODIUM 40 MG/0.4ML ~~LOC~~ SOLN: 40.0000 mg | Freq: Every day | SUBCUTANEOUS | Status: DC

## 2011-02-16 MED ORDER — POLYETHYLENE GLYCOL 3350 17 G PO PACK: 17.0000 g | PACK | Freq: Every day | ORAL | Status: AC

## 2011-02-16 MED ORDER — METHOCARBAMOL 500 MG PO TABS: 500.0000 mg | ORAL_TABLET | Freq: Four times a day (QID) | ORAL | Status: AC | PRN

## 2011-02-16 MED ORDER — OXYCODONE-ACETAMINOPHEN 5-325 MG PO TABS: 1.0000 | ORAL_TABLET | Freq: Four times a day (QID) | ORAL | Status: AC | PRN

## 2011-02-16 MED ORDER — POLYSACCHARIDE IRON 150 MG PO CAPS: 150.0000 mg | ORAL_CAPSULE | Freq: Two times a day (BID) | ORAL | Status: DC

## 2011-02-16 NOTE — Progress Notes (Signed)
Social Work Discharge Note Discharge Note  The overall goal for the admission was met for:   Discharge location: Yes-HOME WITH WIFE TO ASSIST  Length of Stay: Yes-9 DAYS  Discharge activity level: Yes-MOD/I LEVEL-SUPERVISION  Home/community participation: Yes  Services provided included: MD, RD, PT, OT, RN, CM, TR, Pharmacy and SW  Financial Services: Private Insurance: [employer]  Follow-up services arranged: Outpatient: [facility]2/[facility]-MONDAY 9:20-10:15, DME: [facility] and Patient/Family has no preference for HH/DME agencies  Comments (or additional information): WIFE AWARE OF RESOURCES IN THE COMMUNITY  Patient/Family verbalized understanding of follow-up arrangements: Yes  Individual responsible for coordination of the follow-up plan: DORIS-WIFE  Confirmed correct DME delivered: [name] 02/16/2011    [name]

## 2011-02-16 NOTE — Progress Notes (Addendum)
Patient ID: Bruce [name], male   DOB: 01/14/1946, 66 y.o.   MRN: [id number] Subjective/Complaints: Review of Systems  Musculoskeletal: Positive for joint pain (left leg and wirst).  All other systems reviewed and are negative.   1/31--no problems.  Pain controlled  Objective: Vital Signs: Blood pressure 106/54, pulse 50, temperature 97.9 F (36.6 C), temperature source Oral, resp. rate 18, height 5\' 7"  (1.702 m), weight 118.298 kg (260 lb 12.8 oz), SpO2 98.00%. No results found.  Basename 02/14/11 0640  WBC --  HGB 10.4*  HCT 31.6*  PLT --   No results found for this basename: NA:2,K:2,CL:2,CO2:2,GLUCOSE:2,BUN:2,CREATININE:2,CALCIUM:2 in the last 72 hours CBG (last 3)  No results found for this basename: GLUCAP:3 in the last 72 hours  Wt Readings from Last 3 Encounters:  02/15/11 118.298 kg (260 lb 12.8 oz)  02/07/11 122.562 kg (270 lb 3.2 oz)  02/07/11 122.562 kg (270 lb 3.2 oz)    Physical Exam:  General appearance: alert, cooperative and no distress Head: Normocephalic, without obvious abnormality, atraumatic Eyes: conjunctivae/corneas clear. PERRL, EOM's intact. Fundi benign. Ears: normal TM's and external ear canals both ears Nose: Nares normal. Septum midline. Mucosa normal. No drainage or sinus tenderness. Throat: lips, mucosa, and tongue normal; teeth and gums normal Neck: no adenopathy, no carotid bruit, no JVD, supple, symmetrical, trachea midline and thyroid not enlarged, symmetric, no tenderness/mass/nodules Back: symmetric, no curvature. ROM normal. No CVA tenderness. Resp: clear to auscultation bilaterally Cardio: regular rate and rhythm, S1, S2 normal, no murmur, click, rub or gallop GI: soft, non-tender; bowel sounds normal; no masses,  no organomegaly Extremities: trace  edema on LLE. Pulses: 2+ and symmetric Skin: Skin color, texture, turgor normal. No rashes or lesions Neurologic: Grossly normalLLE 3+/5 prox to 5/5 distally Incision/Wound: left leg  incisions clean. Central staples still in place without drainage. Left wrist only tender a bit along distal radius. Exam updated 1/31   Assessment/Plan: 1. Functional deficits secondary to left femoral neck and shaft fx's s/p ORIF and ? Left triquetrum fx which require 3+ hours per day of interdisciplinary therapy in a comprehensive inpatient rehab setting. Physiatrist is providing close team supervision and 24 hour management of active medical problems listed below. Physiatrist and rehab team continue to assess barriers to discharge/monitor patient progress toward functional and medical goals.  D/c home today  TDWB LLE and NWB L wrist  Mobility:   Transfers Transfers: Yes Stand Pivot Transfers: 6: Modified independent (Device/Increase time) Ambulation/Gait Ambulation/Gait Assistance: 6: Modified independent (Device/Increase time) Ambulation Distance (Feet): 150 Feet Assistive device: Left platform walker Wheelchair Mobility Wheelchair Mobility: Yes Wheelchair Assistance: 6: Modified independent (Device/Increase time) Wheelchair Parts Management: Independent Distance: 150 ADL:   Cognition: Cognition Overall Cognitive Status: Appears within functional limits for tasks assessed Arousal/Alertness: Awake/alert Orientation Level: Oriented X4 Safety/Judgment: Appears intact Cognition Arousal/Alertness: Awake/alert Orientation Level: Oriented X4  1. DVT Prophylaxis/Anticoagulation: Pharmaceutical: Lovenox for 4 weeks post op 2. Pain Management: monitor on prn percocet and robaxin. Discussed with patient and wife regarding pain meds and use of them with activities. He understands and is ok with current pain control. 3. Mood: continue celexa.  4. ABLA: iron supplement. hgb 10.4 yesterday 5. COPD: continue symbicort bid with albuterol prn SOB.  6. Bradycardia: monitor with bid checks. Heart rate still in the low 50s. D/c atenolol and f/u with pcp 7. Mild thrombocytopenia: Platelets  normalized.   8. Ortho- tdwb thru left leg and nwb left wrist. He may propel w/c short dx only (less  than 30 ft)  -remove remaining staples today   LOS (Days) 9 A FACE TO FACE EVALUATION WAS PERFORMED  SWARTZ,ZACHARY T 02/16/2011, 7:01 AM

## 2011-02-16 NOTE — Progress Notes (Signed)
Social Work Patient ID: Bruce [name], male   DOB: 1945-12-06, 66 y.o.   MRN: [id number]  Pt wanted OP Rehab appt changed, contacted [facility] to change appt to 2/5-1:25-2:30 pm. Pt liked this better and agreeable.  Gave wife the appt in writing and added to discharge papers. Set for discharge Today.

## 2011-02-16 NOTE — Progress Notes (Signed)
Patient was discharged with wife and daughter assisted by nurse tech. Marissa Nestle, PA gave discharge instructions and answered all questions. Verbalized understanding

## 2011-02-16 NOTE — Progress Notes (Signed)
Therapeutic Recreation Discharge Summary Patient Details  Name: Bruce [name] MRN: [id number] Date of Birth: 02/04/1945  Long term goals set: 1  Long term goals met: 1  Comments on progress toward goals: Pt has made excellent progress toward goal meeting supervision level for community pursuits.  Pt demonstrates good safety awareness and is able to identify and utilize energy conservation techniques.  Pt discharged home with wife today.  Reasons goals not met: n/a  Equipment acquired: n/a  Reasons for discharge: discharge from hospital  Patient/family agrees with progress made and goals achieved: Yes  [name] 02/16/2011, 8:20 AM

## 2011-02-17 NOTE — Discharge Summary (Signed)
NAME:  Bruce [name], Bruce [name] NO.:  [id number]  MEDICAL RECORD NO.:  [id number]  LOCATION:  4006                         FACILITY:  [facility]  PHYSICIAN:  Ranelle Oyster, M.D.DATE OF BIRTH:  12/28/1945  DATE OF ADMISSION:  02/07/2011 DATE OF DISCHARGE:  02/16/2011                              DISCHARGE SUMMARY   DISCHARGE DIAGNOSES: 1. Left intertrochanteric femur fracture with fracture of lateral     femoral shaft as well as avulsion fracture of right triquetrum. 2. Acute blood loss anemia. 3. Chronic obstructive pulmonary disease. 4. Obesity. 5. Depression.  HISTORY OF PRESENT ILLNESS:  Mr. [name] is a 66 year old obese Caucasian male with history of mild COPD, mild thrombocytopenia, admitted to [facility] on January 15 past missing a step while on a ladder and sustaining a fall with injury to left wrist and left hip.  Fall from ladder not associated with any dizziness, palpitation, shortness of breath, chest pain, nausea or vomiting.  The patient was evaluated in ED where x-rays revealed possibly small avulsion fracture triquetrum and acute displaced fracture in proximal intertrochanteric femur and fracture of lateral femoral shaft.  The patient was evaluated by Dr. Hilda Lias and underwent ORIF left hip and left proximal shaft of femur on January 16.  Left wrist was splinted.  Postop is touchdown weightbearing on left lower extremity and nonweightbearing on left wrist.  He is on subcu Lovenox for DVT prophylaxis for 4 total weeks per ortho input.  Left wrist splint changed to cock-up splint on January 20. He was noted to have acute blood loss anemia treated with 1 unit of packed red blood cells with last hemoglobin at 9.1.  Hyponatremia was treated with gentle hydration.  The patient has had some issues with confusion likely due to PCA which are resolving.  The patient was also noted to have some issues with bradycardia and his atenolol dose  was decreased.  Therapies were initiated and are ongoing.  The patient was evaluated by rehab and we felt that he would benefit from a CIR program.  PAST MEDICAL HISTORY:  Positive for COPD, obesity, depression, gout, anemia, eosinophilia, thrombocytopenia, hyperlipidemia.  PAST SURGICAL HISTORY:  Positive for multiple tooth extraction.  FAMILY HISTORY:  Positive for cancer in mother, emphysema in father, and cancer in brother and sister.  SOCIAL HISTORY:  The patient is married.  He does not smoke, however uses smokeless tobacco.  Chews 1-2 packs a day.  Positive alcohol use. Does not use illicit drugs.  ALLERGIES:  Penicillin.  FUNCTIONAL HISTORY:  The patient was independent prior to admission.  He still works.  PHYSICAL EXAMINATION:  GENERAL:  The patient is obese male, alert and oriented x3.  No acute distress. HEENT:  Atraumatic, normocephalic.  Conjunctivae normal.  Extraocular movement intact.  Pupils equal, round, and reactive to light.  Oral mucosa is pink and moist. NECK:  Supple with normal range of motion. CARDIOVASCULAR:  Normal rate, regular rhythm. PULMONARY:  Normal effort.  Lungs are clear. ABDOMEN:  Soft, nontender. MUSCULOSKELETAL:  Left hip with tenderness to range of motion.  Left wrist splint in place.  No palpable tenderness along metacarpals.  He is noted to have  1+ edema left lower extremity. NEUROLOGIC:  The patient is alert and oriented x3.  Reflexes 2+ bilaterally.  Upper extremity strength is 4 to 5/5.  Left lower extremity is 1 to 2/5 proximally, 4/5 distally.  Right lower extremity is 4 to 5/5.  The patient with appropriate cognition. SKIN:  Left hip incision with staples in place and minimal serous drainage.  HOSPITAL COURSE:  [name] was admitted to rehab on February 07, 2011, for inpatient therapies to consist of PT, OT at least 3 hours 5 days a week.  Past admission, physiatrist, rehab, RN, and therapy team have worked together  to provide customized collaborative interdisciplinary care.  Rehab RN has worked with the patient on bowel and bladder program as well as med administration and wound care monitoring.  The patient's left hip incision was noted to be healing well.  He was noted to have some minimal serous drainage from hip at time of admission.  This is resolved by time of discharge.  Staples were discontinued on January 31 prior to discharge.  He is noted to have edema of left lower extremity.  He is encouraged to keep left lower extremity elevated when in bed and also advised to continue wearing TEDs for now.  The patient was started on iron supplement for his acute blood loss anemia.  Labs done past admission revealed H and H at 9.7 and 28.6.  White count 6.6, platelets 211.  Follow up H and H of January 29 shows some improvement with hemoglobin at 10.4, hematocrit at 31.6.  Check of lytes revealed sodium 135, potassium 4.1, chloride 101, CO2 26, BUN 19, creatinine 0.98, glucose 91.  LFTs reveals mild elevation in AST at 50 and low albumin at 2.7.  The patient was maintained on subcu Lovenox throughout his stay.  Wife was educated regarding Lovenox administration and the patient is to continue on this for 2 additional weeks to complete a DVT prophylaxis course.  Patient's pain has been reasonably controlled with p.r.n. use of OxyIR.  During the patient's stay in rehab, weekly team conferences were held to monitor patient's progress, set goals, as well as discussed barriers to discharge.  At admission, the patient was noted to require min to mod assist for mobility due to muscle weakness as well as muscle and joint tightness and decreased endurance.  He was noted to have decreased standing balance with decreased balance strategies.  The patient was noted to require moderate assist for bathing and ADL transfers, total assist for lower body ADLs.  OT has worked with the patient on ambulation and mobility  as well as therapeutic exercises on upper and lower extremity strengthening.  At the time of discharge, the patient is showing improved activity tolerance, improved balance and improved ability to compensate for deficits.  The patient is currently at modified independent level for transfers, modified independent level for ambulating 150 feet with left platform walker.  He is able to propel wheelchair using right lower and right upper extremity independently. The patient is able to perform ADL tasks with min assist.  Family education was done with wife.  She will provide assistance as needed past discharge.  Further followup outpatient physical therapy has been set up at [facility] past discharge.  DISCHARGE MEDICATIONS: 1. Lovenox 40 mg subcu daily. 2. Robaxin 500 mg p.o. q.i.d. p.r.n. spasms. 3. Percocet 5/325 1-2 p.o. q.6 h. p.r.n. pain #75 Rx. 4. MiraLax 17 g in 8 ounces per day. 5. Niferex 150  mg b.i.d. 6. Lipitor 10 mg p.o. at bedtime. 7. Symbicort 80/4.5 mcg 2 puffs b.i.d. 8. Celexa 20 mg p.o. per day. 9. Fish oil tabs 1 p.o. at bedtime.  DIET:  Cardiac diet.  ACTIVITY LEVEL:  As tolerated.  Intermittent supervision for mobility walk with walker.  Continue touchdown weightbearing left lower extremity.  No weight  On left wrist.  Wear splint on left wrist at all times.  Wear support stockings to help with swelling left lower extremity and keep legs elevated when seated.  SPECIAL INSTRUCTIONS:  [facility] to begin on Tuesday February 5 at 1:45.  FOLLOWUP:  The patient to follow up with Dr. Hilda Lias on February 4 at 9:45am.  Follow up with Dr. Lysbeth Galas for post hospital checkup in 2 weeks. Follow up with Dr. Riley Kill as needed.     Delle Reining, P.A.   ______________________________ Ranelle Oyster, M.D.    PL/MEDQ  D:  02/16/2011  T:  02/17/2011  Job:  [id number]  cc:   Delaney Meigs, M.D. Teola Bradley, M.D.

## 2011-02-20 ENCOUNTER — Ambulatory Visit: Payer: BC Managed Care – PPO | Admitting: Physical Therapy

## 2011-02-21 ENCOUNTER — Ambulatory Visit: Payer: BC Managed Care – PPO | Attending: Physical Medicine & Rehabilitation | Admitting: Physical Therapy

## 2011-02-21 DIAGNOSIS — M25539 Pain in unspecified wrist: Secondary | ICD-10-CM | POA: Insufficient documentation

## 2011-02-21 DIAGNOSIS — R262 Difficulty in walking, not elsewhere classified: Secondary | ICD-10-CM | POA: Insufficient documentation

## 2011-02-21 DIAGNOSIS — M25559 Pain in unspecified hip: Secondary | ICD-10-CM | POA: Insufficient documentation

## 2011-02-21 DIAGNOSIS — IMO0001 Reserved for inherently not codable concepts without codable children: Secondary | ICD-10-CM | POA: Insufficient documentation

## 2011-02-21 DIAGNOSIS — R5381 Other malaise: Secondary | ICD-10-CM | POA: Insufficient documentation

## 2011-02-21 DIAGNOSIS — M2560 Stiffness of unspecified joint, not elsewhere classified: Secondary | ICD-10-CM | POA: Insufficient documentation

## 2011-02-23 ENCOUNTER — Ambulatory Visit: Payer: BC Managed Care – PPO | Admitting: Physical Therapy

## 2011-02-28 ENCOUNTER — Ambulatory Visit: Payer: BC Managed Care – PPO | Admitting: *Deleted

## 2011-03-02 ENCOUNTER — Ambulatory Visit: Payer: BC Managed Care – PPO | Admitting: *Deleted

## 2011-03-07 ENCOUNTER — Ambulatory Visit: Payer: BC Managed Care – PPO | Admitting: Physical Therapy

## 2011-03-09 ENCOUNTER — Ambulatory Visit: Payer: BC Managed Care – PPO | Admitting: Physical Therapy

## 2011-03-14 ENCOUNTER — Ambulatory Visit: Payer: BC Managed Care – PPO | Admitting: *Deleted

## 2011-03-16 ENCOUNTER — Ambulatory Visit: Payer: BC Managed Care – PPO | Admitting: *Deleted

## 2011-03-21 ENCOUNTER — Ambulatory Visit: Payer: BC Managed Care – PPO | Attending: Physical Medicine & Rehabilitation | Admitting: *Deleted

## 2011-03-21 DIAGNOSIS — M2560 Stiffness of unspecified joint, not elsewhere classified: Secondary | ICD-10-CM | POA: Insufficient documentation

## 2011-03-21 DIAGNOSIS — R262 Difficulty in walking, not elsewhere classified: Secondary | ICD-10-CM | POA: Insufficient documentation

## 2011-03-21 DIAGNOSIS — IMO0001 Reserved for inherently not codable concepts without codable children: Secondary | ICD-10-CM | POA: Insufficient documentation

## 2011-03-21 DIAGNOSIS — M25559 Pain in unspecified hip: Secondary | ICD-10-CM | POA: Insufficient documentation

## 2011-03-21 DIAGNOSIS — R5381 Other malaise: Secondary | ICD-10-CM | POA: Insufficient documentation

## 2011-03-21 DIAGNOSIS — M25539 Pain in unspecified wrist: Secondary | ICD-10-CM | POA: Insufficient documentation

## 2011-03-22 ENCOUNTER — Telehealth (HOSPITAL_COMMUNITY): Payer: Self-pay | Admitting: Oncology

## 2011-03-23 ENCOUNTER — Ambulatory Visit: Payer: BC Managed Care – PPO | Admitting: *Deleted

## 2011-03-28 ENCOUNTER — Ambulatory Visit: Payer: BC Managed Care – PPO | Admitting: Physical Therapy

## 2011-03-30 ENCOUNTER — Ambulatory Visit: Payer: BC Managed Care – PPO | Admitting: Physical Therapy

## 2011-04-04 ENCOUNTER — Ambulatory Visit: Payer: BC Managed Care – PPO | Admitting: Physical Therapy

## 2011-04-06 ENCOUNTER — Ambulatory Visit: Payer: BC Managed Care – PPO | Admitting: Physical Therapy

## 2011-04-11 ENCOUNTER — Ambulatory Visit: Payer: BC Managed Care – PPO | Admitting: *Deleted

## 2011-04-13 ENCOUNTER — Ambulatory Visit: Payer: BC Managed Care – PPO | Admitting: *Deleted

## 2011-04-18 ENCOUNTER — Ambulatory Visit: Payer: BC Managed Care – PPO | Attending: Physical Medicine & Rehabilitation | Admitting: *Deleted

## 2011-04-18 DIAGNOSIS — M25539 Pain in unspecified wrist: Secondary | ICD-10-CM | POA: Insufficient documentation

## 2011-04-18 DIAGNOSIS — M2560 Stiffness of unspecified joint, not elsewhere classified: Secondary | ICD-10-CM | POA: Insufficient documentation

## 2011-04-18 DIAGNOSIS — R5381 Other malaise: Secondary | ICD-10-CM | POA: Insufficient documentation

## 2011-04-18 DIAGNOSIS — M25559 Pain in unspecified hip: Secondary | ICD-10-CM | POA: Insufficient documentation

## 2011-04-18 DIAGNOSIS — IMO0001 Reserved for inherently not codable concepts without codable children: Secondary | ICD-10-CM | POA: Insufficient documentation

## 2011-04-18 DIAGNOSIS — R262 Difficulty in walking, not elsewhere classified: Secondary | ICD-10-CM | POA: Insufficient documentation

## 2011-04-20 ENCOUNTER — Encounter: Payer: BC Managed Care – PPO | Admitting: *Deleted

## 2011-04-21 ENCOUNTER — Encounter: Payer: Self-pay | Admitting: Physical Medicine & Rehabilitation

## 2011-04-25 ENCOUNTER — Ambulatory Visit: Payer: BC Managed Care – PPO | Admitting: *Deleted

## 2011-04-27 ENCOUNTER — Ambulatory Visit: Payer: BC Managed Care – PPO | Admitting: *Deleted

## 2011-05-02 ENCOUNTER — Ambulatory Visit: Payer: BC Managed Care – PPO | Admitting: *Deleted

## 2011-05-04 ENCOUNTER — Ambulatory Visit: Payer: BC Managed Care – PPO | Admitting: *Deleted

## 2011-05-09 ENCOUNTER — Ambulatory Visit: Payer: BC Managed Care – PPO | Admitting: *Deleted

## 2011-05-11 ENCOUNTER — Ambulatory Visit: Payer: BC Managed Care – PPO | Admitting: *Deleted

## 2011-05-12 ENCOUNTER — Ambulatory Visit (HOSPITAL_COMMUNITY): Payer: BC Managed Care – PPO | Admitting: Oncology

## 2011-05-15 ENCOUNTER — Encounter (HOSPITAL_COMMUNITY): Payer: Self-pay

## 2011-05-15 ENCOUNTER — Encounter (HOSPITAL_COMMUNITY): Payer: BC Managed Care – PPO | Attending: Hematology and Oncology

## 2011-05-15 VITALS — BP 160/89 | HR 49 | Temp 97.8°F | Ht 68.0 in | Wt 272.2 lb

## 2011-05-15 DIAGNOSIS — D696 Thrombocytopenia, unspecified: Secondary | ICD-10-CM | POA: Insufficient documentation

## 2011-05-15 DIAGNOSIS — D721 Eosinophilia, unspecified: Secondary | ICD-10-CM | POA: Insufficient documentation

## 2011-05-15 DIAGNOSIS — J45909 Unspecified asthma, uncomplicated: Secondary | ICD-10-CM | POA: Insufficient documentation

## 2011-05-15 LAB — COMPREHENSIVE METABOLIC PANEL
BUN: 15 mg/dL (ref 6–23)
CO2: 27 mEq/L (ref 19–32)
Chloride: 99 mEq/L (ref 96–112)
Creatinine, Ser: 1.03 mg/dL (ref 0.50–1.35)
GFR calc Af Amer: 87 mL/min — ABNORMAL LOW (ref >90)
GFR calc non Af Amer: 75 mL/min — ABNORMAL LOW (ref >90)
Glucose, Bld: 104 mg/dL — ABNORMAL HIGH (ref 70–99)
Total Bilirubin: 0.5 mg/dL (ref 0.3–1.2)

## 2011-05-15 LAB — DIFFERENTIAL
Eosinophils Absolute: 1 10*3/uL — ABNORMAL HIGH (ref 0.0–0.7)
Lymphs Abs: 1.3 10*3/uL (ref 0.7–4.0)
Neutrophils Relative %: 58 % (ref 43–77)

## 2011-05-15 LAB — CBC
MCH: 29.2 pg (ref 26.0–34.0)
Platelets: 135 10*3/uL — ABNORMAL LOW (ref 150–400)
RBC: 4.32 MIL/uL (ref 4.22–5.81)
WBC: 6.5 10*3/uL (ref 4.0–10.5)

## 2011-05-15 MED ORDER — CITALOPRAM HYDROBROMIDE 20 MG PO TABS: 20.0000 mg | ORAL_TABLET | Freq: Every day | ORAL | Status: DC

## 2011-05-15 NOTE — Patient Instructions (Addendum)
Patient to follow up as instructed.   Current Outpatient Prescriptions  Medication Sig Dispense Refill  . atorvastatin (LIPITOR) 10 MG tablet Take 10 mg by mouth at bedtime.       . budesonide-formoterol (SYMBICORT) 80-4.5 MCG/ACT inhaler Inhale 2 puffs into the lungs 2 (two) times daily.        . citalopram (CELEXA) 20 MG tablet Take 20 mg by mouth daily.      . fish oil-omega-3 fatty acids 1000 MG capsule Take 1,000 mg by mouth at bedtime.       . methocarbamol (ROBAXIN) 500 MG tablet Take 500 mg by mouth 3 (three) times daily.      . naproxen (NAPROSYN) 500 MG tablet Take 500 mg by mouth 2 (two) times daily with a meal.      . oxyCODONE-acetaminophen (PERCOCET) 5-325 MG per tablet Take 1 tablet by mouth every 6 (six) hours as needed.      . enoxaparin (LOVENOX) 40 MG/0.4ML SOLN Inject 0.4 mLs (40 mg total) into the skin daily.  15 Syringe  0  . polysaccharide iron (NIFEREX) 150 MG CAPS capsule Take 1 capsule (150 mg total) by mouth 2 times daily at 12 noon and 4 pm.  60 each  17 April 2011  Sunday Monday Tuesday Wednesday Thursday Friday Saturday      1   2   TREATMENT-ORTHO   9:00 AM  (45 min.)  Chris Ramseur  [facility] 3   4   5   6    7   8   9    TREATMENT-ORTHO   9:00 AM  (45 min.)  Chris Ramseur  [facility] 10   11   TREATMENT-ORTHO   9:00 AM  (45 min.)  Chris Ramseur  [facility] 12   13    14   15   16    TREATMENT-ORTHO   9:00 AM  (45 min.)  Chris Ramseur  [facility] 17   18   TREATMENT-ORTHO   9:00 AM  (45 min.)  Chris Ramseur  [facility] 19   20    21   22   23    TREATMENT-ORTHO   9:00 AM  (45 min.)  Chris Ramseur  [facility] 24   25   TREATMENT-ORTHO   9:00 AM  (45 min.)  Thayer Ohm [facility]seur  [facility] 26   27    28   29    OFFICE VISIT 30   9:30 AM  (30 min.)  [employer] Covering Provider  [facility] [address]                    Bruce [name]  [id number] 04-13-1945 Dr. Glenford Peers   [facility]  Discharge Instructions  RECOMMENDATIONS MADE BY THE CONSULTANT AND ANY TEST RESULTS WILL BE SENT TO YOUR REFERRING DOCTOR.   EXAM FINDINGS BY MD TODAY AND SIGNS AND SYMPTOMS TO REPORT TO CLINIC OR PRIMARY MD: Lab work is stable.  Report unusual bruising or bleeding.  MEDICATIONS PRESCRIBED: Prescription for Celexa sent to your pharmacy.      SPECIAL INSTRUCTIONS/FOLLOW-UP: Return to Clinic in 6 months.   I acknowledge that I have been informed and understand all the instructions given to me and received a copy. I do not have any more questions at this time, but understand that I may call the Specialty Clinic at  [facility] at [phone] during business hours should I have any further questions or need assistance in obtaining follow-up care.    __________________________________________  _____________  __________ Signature of Patient or Authorized Representative            Date                   Time    __________________________________________ [employer]

## 2011-05-15 NOTE — Progress Notes (Signed)
This office note has been dictated.

## 2011-05-15 NOTE — Progress Notes (Signed)
CC:   Bruce [name], M.D.  IDENTIFYING STATEMENT:  The patient is a 66 year old man who is seen routinely here with eosinophilia and thrombocytopenia.  INTERVAL HISTORY:  The patient was last seen 9 months ago.  Since that time, unrelated to his hematology issues, he fell, fracturing his left hip and has since had repair.  He also injured his wrist and finger.  He does have environmental allergies.  He has not lost any weight.  He denies fever, chills, night sweats.  Denies anorexia.  Denies diarrhea.  MEDICATIONS:  Unchanged.  PHYSICAL EXAMINATION:  Alert and oriented x3.  Vitals:  Pulse 49, blood pressure 160/89, temperature 97.9, respirations 16, weight 272 pounds. Sclerae anicteric.  Mouth moist.  Chest:  CVS unremarkable.  Skin:  No bruising.  Extremities:  No edema.  LABORATORY DATA:  05/15/2011:  White cell count 6.5, hemoglobin 12.6, hematocrit 38.9, platelets 135, absolute eosinophils 1.  IMPRESSION/PLAN:  Mr. Capelli is a 66 year old man seen by Hematology with history of thrombocytopenia and eosinophilia.  Review of his most up-to-date CBCs notes stable lab work.  He does have underlying asthma/allergies.  He follows up in 6 months' time for ongoing assessment.    ______________________________ Laurice Record, M.D. LIO/MEDQ  D:  05/15/2011  T:  05/15/2011  Job:  [id number]

## 2011-05-16 ENCOUNTER — Ambulatory Visit: Payer: BC Managed Care – PPO | Admitting: Physical Therapy

## 2011-05-18 ENCOUNTER — Ambulatory Visit: Payer: BC Managed Care – PPO | Attending: Physical Medicine & Rehabilitation | Admitting: *Deleted

## 2011-05-18 DIAGNOSIS — R5381 Other malaise: Secondary | ICD-10-CM | POA: Insufficient documentation

## 2011-05-18 DIAGNOSIS — M2560 Stiffness of unspecified joint, not elsewhere classified: Secondary | ICD-10-CM | POA: Insufficient documentation

## 2011-05-18 DIAGNOSIS — M25559 Pain in unspecified hip: Secondary | ICD-10-CM | POA: Insufficient documentation

## 2011-05-18 DIAGNOSIS — M25539 Pain in unspecified wrist: Secondary | ICD-10-CM | POA: Insufficient documentation

## 2011-05-18 DIAGNOSIS — R262 Difficulty in walking, not elsewhere classified: Secondary | ICD-10-CM | POA: Insufficient documentation

## 2011-05-18 DIAGNOSIS — IMO0001 Reserved for inherently not codable concepts without codable children: Secondary | ICD-10-CM | POA: Insufficient documentation

## 2011-05-23 ENCOUNTER — Ambulatory Visit: Payer: BC Managed Care – PPO | Admitting: Physical Therapy

## 2011-05-25 ENCOUNTER — Ambulatory Visit: Payer: BC Managed Care – PPO | Admitting: Physical Therapy

## 2011-05-30 ENCOUNTER — Ambulatory Visit: Payer: BC Managed Care – PPO | Admitting: Physical Therapy

## 2011-06-02 ENCOUNTER — Encounter: Payer: BC Managed Care – PPO | Admitting: Physical Therapy

## 2011-06-06 ENCOUNTER — Encounter: Payer: BC Managed Care – PPO | Admitting: *Deleted

## 2011-06-08 ENCOUNTER — Ambulatory Visit: Payer: BC Managed Care – PPO | Admitting: *Deleted

## 2011-06-15 ENCOUNTER — Ambulatory Visit: Payer: BC Managed Care – PPO | Admitting: Physical Therapy

## 2011-06-20 ENCOUNTER — Ambulatory Visit: Payer: BC Managed Care – PPO | Attending: Physical Medicine & Rehabilitation | Admitting: Physical Therapy

## 2011-06-20 DIAGNOSIS — M25559 Pain in unspecified hip: Secondary | ICD-10-CM | POA: Insufficient documentation

## 2011-06-20 DIAGNOSIS — IMO0001 Reserved for inherently not codable concepts without codable children: Secondary | ICD-10-CM | POA: Insufficient documentation

## 2011-06-20 DIAGNOSIS — M2560 Stiffness of unspecified joint, not elsewhere classified: Secondary | ICD-10-CM | POA: Insufficient documentation

## 2011-06-20 DIAGNOSIS — R5381 Other malaise: Secondary | ICD-10-CM | POA: Insufficient documentation

## 2011-06-20 DIAGNOSIS — M25539 Pain in unspecified wrist: Secondary | ICD-10-CM | POA: Insufficient documentation

## 2011-06-20 DIAGNOSIS — R262 Difficulty in walking, not elsewhere classified: Secondary | ICD-10-CM | POA: Insufficient documentation

## 2011-09-21 ENCOUNTER — Other Ambulatory Visit (HOSPITAL_COMMUNITY): Payer: Self-pay | Admitting: Oncology

## 2011-09-21 ENCOUNTER — Telehealth (HOSPITAL_COMMUNITY): Payer: Self-pay | Admitting: *Deleted

## 2011-09-21 DIAGNOSIS — D696 Thrombocytopenia, unspecified: Secondary | ICD-10-CM

## 2011-09-21 DIAGNOSIS — D721 Eosinophilia, unspecified: Secondary | ICD-10-CM

## 2011-09-21 MED ORDER — CITALOPRAM HYDROBROMIDE 20 MG PO TABS: 20.0000 mg | ORAL_TABLET | Freq: Every day | ORAL | Status: DC

## 2011-10-27 ENCOUNTER — Other Ambulatory Visit (HOSPITAL_COMMUNITY): Payer: Self-pay | Admitting: Oncology

## 2011-10-27 ENCOUNTER — Telehealth (HOSPITAL_COMMUNITY): Payer: Self-pay | Admitting: *Deleted

## 2011-10-27 DIAGNOSIS — D721 Eosinophilia, unspecified: Secondary | ICD-10-CM

## 2011-10-27 DIAGNOSIS — D696 Thrombocytopenia, unspecified: Secondary | ICD-10-CM

## 2011-10-27 NOTE — Telephone Encounter (Signed)
Spoke with wife and informed that he should have 2 refills left.  Per wife [facility] in [place] filled it but told her that they did not have any refills on the Rx.

## 2011-10-27 NOTE — Telephone Encounter (Signed)
He should have 2 refills left

## 2011-11-13 ENCOUNTER — Encounter (HOSPITAL_COMMUNITY): Payer: BC Managed Care – PPO | Attending: Oncology

## 2011-11-13 DIAGNOSIS — D696 Thrombocytopenia, unspecified: Secondary | ICD-10-CM | POA: Insufficient documentation

## 2011-11-13 DIAGNOSIS — D721 Eosinophilia, unspecified: Secondary | ICD-10-CM | POA: Insufficient documentation

## 2011-11-13 LAB — DIFFERENTIAL
Eosinophils Absolute: 1.6 10*3/uL — ABNORMAL HIGH (ref 0.0–0.7)
Eosinophils Relative: 20 % — ABNORMAL HIGH (ref 0–5)
Lymphocytes Relative: 18 % (ref 12–46)
Lymphs Abs: 1.4 10*3/uL (ref 0.7–4.0)
Monocytes Absolute: 0.6 10*3/uL (ref 0.1–1.0)

## 2011-11-13 LAB — CBC
HCT: 40.3 % (ref 39.0–52.0)
MCH: 30.3 pg (ref 26.0–34.0)
MCV: 91.2 fL (ref 78.0–100.0)
RDW: 13 % (ref 11.5–15.5)
WBC: 7.8 10*3/uL (ref 4.0–10.5)

## 2011-11-13 LAB — COMPREHENSIVE METABOLIC PANEL
Albumin: 3.8 g/dL (ref 3.5–5.2)
BUN: 15 mg/dL (ref 6–23)
Chloride: 104 mEq/L (ref 96–112)
Creatinine, Ser: 1.08 mg/dL (ref 0.50–1.35)
GFR calc Af Amer: 82 mL/min — ABNORMAL LOW (ref >90)
Total Bilirubin: 0.4 mg/dL (ref 0.3–1.2)

## 2011-11-13 NOTE — Progress Notes (Signed)
Labs drawn today for cbc/diff,cmp 

## 2011-11-17 ENCOUNTER — Ambulatory Visit (HOSPITAL_COMMUNITY): Payer: BC Managed Care – PPO | Admitting: Oncology

## 2011-12-01 ENCOUNTER — Encounter (HOSPITAL_COMMUNITY): Payer: Self-pay | Admitting: Oncology

## 2011-12-01 ENCOUNTER — Encounter (HOSPITAL_COMMUNITY): Payer: BC Managed Care – PPO | Attending: Oncology | Admitting: Oncology

## 2011-12-01 VITALS — BP 171/83 | HR 50 | Temp 97.4°F | Resp 20 | Wt 281.5 lb

## 2011-12-01 DIAGNOSIS — D696 Thrombocytopenia, unspecified: Secondary | ICD-10-CM

## 2011-12-01 DIAGNOSIS — F3289 Other specified depressive episodes: Secondary | ICD-10-CM

## 2011-12-01 DIAGNOSIS — F329 Major depressive disorder, single episode, unspecified: Secondary | ICD-10-CM

## 2011-12-01 DIAGNOSIS — J301 Allergic rhinitis due to pollen: Secondary | ICD-10-CM

## 2011-12-01 DIAGNOSIS — J45909 Unspecified asthma, uncomplicated: Secondary | ICD-10-CM

## 2011-12-01 DIAGNOSIS — D721 Eosinophilia, unspecified: Secondary | ICD-10-CM

## 2011-12-01 DIAGNOSIS — I10 Essential (primary) hypertension: Secondary | ICD-10-CM

## 2011-12-01 HISTORY — DX: Unspecified asthma, uncomplicated: J45.909

## 2011-12-01 NOTE — Patient Instructions (Signed)
[facility]  Discharge Instructions  RECOMMENDATIONS MADE BY THE CONSULTANT AND ANY TEST RESULTS WILL BE SENT TO YOUR REFERRING DOCTOR.   EXAM FINDINGS BY MD TODAY AND SIGNS AND SYMPTOMS TO REPORT TO CLINIC OR PRIMARY MD:  Exam per T. Kefalas PA    SPECIAL INSTRUCTIONS/FOLLOW-UP:    We will call pt with appts.   I acknowledge that I have been informed and understand all the instructions given to me and received a copy. I do not have any more questions at this time, but understand that I may call the Specialty Clinic at [facility] at [phone] during business hours should I have any further questions or need assistance in obtaining follow-up care.    __________________________________________  _____________  __________ Signature of Patient or Authorized Representative            Date                   Time    __________________________________________ Nurse's Signature

## 2011-12-01 NOTE — Progress Notes (Signed)
Bruce Hector, MD [address] [place] [address]  1. Thrombocytopenia  iron polysaccharides (POLYSACCHARIDE IRON) 150 MG capsule, CBC with Differential, US Abdomen Limited  2. Eosinophilia  iron polysaccharides (POLYSACCHARIDE IRON) 150 MG capsule, CBC with Differential  3. Asthma    4. ALLERGIC RHINITIS, SEASONAL      CURRENT THERAPY: Observation  INTERVAL HISTORY: Bruce [name] 65 y.o. 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   I personally reviewed and went over laboratory results with the patient.  Labs are stable and he continues to have eosinophilia and mild thrombocytopenia.  Both are stable. In Jan 2012 he was admitted for fractured hip.  At that time his eosinophils were within normal limits.  Otherwise, they are mildly elevated.  This tells me that his counts do respond.  His last CT abd/pelvis was in 2011 and he did not have splenomegaly at that time. Since it has been 2 years, and he continues to have a mildly, yet stable, platelet count, we will re-evaluate for splenomegaly via [place].   He does report to having asthma and allergies.  He takes Symbicort, 2 puffs, BID and he report he was diagnosed many years ago by Dr.Best.  He also has allergies and his worst time is Winter-Spring time with his allergies.     Complete ROS questioning is negative.  He has already had a flu shot.  Past Medical History  Diagnosis Date  . Eosinophilia   . Thrombocytopenia   . COPD (chronic obstructive pulmonary disease)   . Obesity   . Degenerative joint disease   . Depression   . Gout   . Anemia 02/02/2011  . Hyperlipidemia   . Hip fracture, left January 2013.    Status post ORIF  . Wrist fracture, left   . Laceration of finger, index     left  . Asthma 12/01/2011    On symbicort 2 puffs BID    has GOUT, UNSPECIFIED; Eosinophilia; ALLERGIC RHINITIS,  SEASONAL; RECTAL BLEEDING; ARTHRITIS; COPD (chronic obstructive pulmonary disease); Hip fracture, intertrochanteric; Wrist fracture; Thrombocytopenia; Anemia; Hyponatremia; Depression; Confusion; Bradycardia; Closed left subtrochanteric femur fracture; Wrist fracture, left; and Asthma on his problem list.     is allergic to penicillins.  Mr. Tabora had no medications administered during this visit.  Past Surgical History  Procedure Date  . Multiple tooth extractions   . Left hip orif January 2013.    Dr. Hilda Lias.  . Compression hip screw 02/01/2011    Procedure: COMPRESSION HIP;  Surgeon: Darreld Mclean, MD;  Location: [facility];  Service: Orthopedics;  Laterality: Left;  . Orif femur fracture 02/01/2011    Procedure: OPEN REDUCTION INTERNAL FIXATION (ORIF) DISTAL FEMUR FRACTURE;  Surgeon: Darreld Mclean, MD;  Location: [facility];  Service: Orthopedics;  Laterality: Left;    Denies any headaches, dizziness, double vision, fevers, chills, night sweats, nausea, vomiting, diarrhea, constipation, chest pain, heart palpitations, shortness of breath, blood in stool, black tarry stool, urinary pain, urinary burning, urinary frequency, hematuria.   PHYSICAL EXAMINATION  ECOG PERFORMANCE STATUS: 0 - Asymptomatic  Filed Vitals:   12/01/11 0917  BP: 171/83  Pulse: 50  Temp: 97.4 F (36.3 C)  Resp: 20    GENERAL:alert, no distress, obese and smiling SKIN: skin color, texture, turgor are normal, no rashes or significant lesions HEAD: Normocephalic, No masses, lesions, tenderness or abnormalities EYES: normal, Conjunctiva are pink and  non-injected EARS: External ears normal OROPHARYNX:mucous membranes are moist  NECK: supple, trachea midline LYMPH:  no palpable lymphadenopathy, no hepatosplenomegaly BREAST:not examined LUNGS: clear to auscultation and percussion, decreased breath sounds HEART: regular rate & rhythm, no murmurs, no gallops, S1 normal and S2 normal ABDOMEN:abdomen soft, non-tender,  obese, normal bowel sounds, no masses or organomegaly and no hepatosplenomegaly BACK: Back symmetric, no curvature. EXTREMITIES:less then 2 second capillary refill, no joint deformities, effusion, or inflammation, no skin discoloration, no clubbing, no cyanosis  NEURO: alert & oriented x 3 with fluent speech, no focal motor/sensory deficits, gait normal    LABORATORY DATA: CBC    Component Value Date/Time   WBC 7.8 11/13/2011 0847   RBC 4.42 11/13/2011 0847   HGB 13.4 11/13/2011 0847   HCT 40.3 11/13/2011 0847   PLT 137* 11/13/2011 0847   MCV 91.2 11/13/2011 0847   MCH 30.3 11/13/2011 0847   MCHC 33.3 11/13/2011 0847   RDW 13.0 11/13/2011 0847   LYMPHSABS 1.4 11/13/2011 0847   MONOABS 0.6 11/13/2011 0847   EOSABS 1.6* 11/13/2011 0847   BASOSABS 0.1 11/13/2011 0847    Results for Bruce [name], Bruce [name] (MRN [id number]) as of 12/01/2011 09:39  Ref. Range 06/24/2010 09:02 06/24/2010 09:02 09/12/2010 09:34 01/31/2011 17:31 02/02/2011 05:21 02/03/2011 05:19 02/04/2011 06:34 02/08/2011 05:20 05/15/2011 10:33 11/13/2011 08:47  Eosinophils Absolute Latest Range: 0.0-0.7 K/uL 1.0 (H) 1.0 (H) 1.6 (H) 0.9 (H) 0.8 (H) 0.4 0.4 0.8 (H) 1.0 (H) 1.6 (H)    Results for Bruce [name], Bruce [name] (MRN [id number]) as of 12/01/2011 09:39  Ref. Range 06/24/2010 09:02 09/12/2010 09:34 01/31/2011 17:31 02/01/2011 05:16 02/02/2011 05:21 02/03/2011 05:19 02/04/2011 06:34 02/06/2011 04:48 02/08/2011 05:20 05/15/2011 10:33 11/13/2011 08:47  Platelets Latest Range: 150-400 K/uL 126 (L) 133 (L) 126 (L) 119 (L) 113 (L) 99 (L) 113 (L) 172 211 135 (L) 137 (L)      ASSESSMENT:  1. 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 Asthma/Allergies, on Symbicort.  3. Obesity.  4. HTN 5. Hypercholesterolemia.  6. Degenerative joint disease.  7. Depression on citalopram with nice improvement.  8. History of gout.   PLAN:  1. I personally reviewed and went over laboratory  results with the patient. 2. I personally reviewed and went over radiographic studies with the patient. 3. [place] of Abdomen, (limited) to evaluate splenomegaly. 4. Labs in 6 months: CBC diff 5. Patient education regarding his blood work.  6. Return in 6 months for follow-up.    All questions were answered. The patient knows to call the clinic with any problems, questions or concerns. We can certainly see the patient much sooner if necessary.  The patient and plan discussed with Glenford Peers, MD and he is in agreement with the aforementioned.  KEFALAS,THOMAS

## 2011-12-25 ENCOUNTER — Ambulatory Visit (HOSPITAL_COMMUNITY)
Admission: RE | Admit: 2011-12-25 | Discharge: 2011-12-25 | Disposition: A | Discharge status: Home / Self Care | Payer: BC Managed Care – PPO | Source: Ambulatory Visit | Attending: Oncology | Admitting: Oncology

## 2011-12-25 DIAGNOSIS — D696 Thrombocytopenia, unspecified: Secondary | ICD-10-CM | POA: Insufficient documentation

## 2011-12-26 ENCOUNTER — Telehealth (HOSPITAL_COMMUNITY): Payer: Self-pay | Admitting: *Deleted

## 2011-12-26 NOTE — Telephone Encounter (Signed)
Pt would like to know results of Ultrasound from 12/9

## 2012-03-04 ENCOUNTER — Other Ambulatory Visit (HOSPITAL_COMMUNITY): Payer: Self-pay | Admitting: Oncology

## 2012-05-30 ENCOUNTER — Encounter (HOSPITAL_COMMUNITY): Payer: BC Managed Care – PPO | Attending: Oncology

## 2012-05-30 DIAGNOSIS — J45909 Unspecified asthma, uncomplicated: Secondary | ICD-10-CM | POA: Insufficient documentation

## 2012-05-30 DIAGNOSIS — D696 Thrombocytopenia, unspecified: Secondary | ICD-10-CM | POA: Insufficient documentation

## 2012-05-30 DIAGNOSIS — D721 Eosinophilia, unspecified: Secondary | ICD-10-CM | POA: Insufficient documentation

## 2012-05-30 LAB — CBC WITH DIFFERENTIAL/PLATELET
Basophils Absolute: 0.1 10*3/uL (ref 0.0–0.1)
Basophils Relative: 1 % (ref 0–1)
Lymphocytes Relative: 20 % (ref 12–46)
MCHC: 33 g/dL (ref 30.0–36.0)
Neutro Abs: 2.6 10*3/uL (ref 1.7–7.7)
Platelets: 143 10*3/uL — ABNORMAL LOW (ref 150–400)
RDW: 13.1 % (ref 11.5–15.5)
WBC: 6.1 10*3/uL (ref 4.0–10.5)

## 2012-05-30 NOTE — Progress Notes (Signed)
Labs drawn today for cbc/diff 

## 2012-06-03 ENCOUNTER — Encounter (HOSPITAL_COMMUNITY): Payer: Self-pay

## 2012-06-03 ENCOUNTER — Encounter (HOSPITAL_BASED_OUTPATIENT_CLINIC_OR_DEPARTMENT_OTHER): Payer: BC Managed Care – PPO

## 2012-06-03 VITALS — BP 151/58 | HR 53 | Temp 97.9°F | Resp 18 | Wt 282.2 lb

## 2012-06-03 DIAGNOSIS — D696 Thrombocytopenia, unspecified: Secondary | ICD-10-CM

## 2012-06-03 MED ORDER — CITALOPRAM HYDROBROMIDE 20 MG PO TABS: ORAL_TABLET | ORAL | Status: DC

## 2012-06-03 NOTE — Patient Instructions (Addendum)
[facility] Discharge Instructions  RECOMMENDATIONS MADE BY THE CONSULTANT AND ANY TEST RESULTS WILL BE SENT TO YOUR REFERRING PHYSICIAN.  EXAM FINDINGS BY THE PHYSICIAN TODAY AND SIGNS OR SYMPTOMS TO REPORT TO CLINIC OR PRIMARY PHYSICIAN: Exam and discussion by Dr. Sharia Reeve.  Your blood work was good.  Generally don't worry about platelet count that is greater than 100,000.  The high eosinophil count may be related to your asthma and allergies.  MEDICATIONS PRESCRIBED:  Citalopram refilled  INSTRUCTIONS GIVEN AND DISCUSSED: Report unusual bruising or bleeding, night sweats, recurring infections, etc.  SPECIAL INSTRUCTIONS/FOLLOW-UP: Follow-up with blood work and exam in 6 months.  Thank you for choosing [facility] to provide your oncology and hematology care.  To afford each patient quality time with our providers, please arrive at least 15 minutes before your scheduled appointment time.  With your help, our goal is to use those 15 minutes to complete the necessary work-up to ensure our physicians have the information they need to help with your evaluation and healthcare recommendations.    Effective January 1st, 2014, we ask that you re-schedule your appointment with our physicians should you arrive 10 or more minutes late for your appointment.  We strive to give you quality time with our providers, and arriving late affects you and other patients whose appointments are after yours.    Again, thank you for choosing [facility].  Our hope is that these requests will decrease the amount of time that you wait before being seen by our physicians.       _____________________________________________________________  Should you have questions after your visit to [facility], please contact our office at [phone] between the hours of 8:30 a.m. and 5:00 p.m.  Voicemails left after 4:30 p.m. will not be returned until the following  business day.  For prescription refill requests, have your pharmacy contact our office with your prescription refill request.

## 2012-06-03 NOTE — Progress Notes (Addendum)
Patient ID: Bruce [name], male   DOB: April 30, 1945, 67 y.o.   MRN: [id number] ..........      [name] [phone]   [phone]  OFFICE PROGRESS NOTE  Bruce [name] [address] [place] [address]  DIAGNOSIS: Eosinophilia                        Thrombocytopenia  INTERVAL HISTORY: Bruce [name] 67 y.o. male returns to the clinic today for follow of mild eosinophilia believed to be related to Asthma. He also has mild thrombocytopenia. He tell me that overall he feels well.He denies night sweats,fever or unintended weight loss. He reports that he uses his  Symbicort 2 puff bid  Without nocturnal symptom exacerbation. He admits to easy bruising only on his forearms but he is in celexa .  Electronic chart reviewed.  MEDICAL HISTORY: Past Medical History  Diagnosis Date  . Eosinophilia   . Thrombocytopenia   . COPD (chronic obstructive pulmonary disease)   . Obesity   . Degenerative joint disease   . Depression   . Gout   . Anemia 02/02/2011  . Hyperlipidemia   . Hip fracture, left January 2013.    Status post ORIF  . Wrist fracture, left   . Laceration of finger, index     left  . Asthma 12/01/2011    On symbicort 2 puffs BID    ALLERGIES:  has no active allergies.  MEDICATIONS:  Current Outpatient Prescriptions  Medication Sig Dispense Refill  . atorvastatin (LIPITOR) 10 MG tablet Take 10 mg by mouth at bedtime.       . budesonide-formoterol (SYMBICORT) 80-4.5 MCG/ACT inhaler Inhale 2 puffs into the lungs 2 (two) times daily.        . citalopram (CELEXA) 20 MG tablet TAKE ONE TABLET BY MOUTH EVERY DAY  30 tablet  5  . fish oil-omega-3 fatty acids 1000 MG capsule Take 1,000 mg by mouth at bedtime.       . methocarbamol (ROBAXIN) 500 MG tablet Take 500 mg by mouth 3 (three) times daily.      . naproxen (NAPROSYN) 500 MG tablet Take 500 mg by mouth 2 (two) times daily with a meal.      . oxyCODONE-acetaminophen  (PERCOCET) 5-325 MG per tablet Take 1 tablet by mouth every 6 (six) hours as needed.       No current facility-administered medications for this visit.    SURGICAL HISTORY:  Past Surgical History  Procedure Laterality Date  . Multiple tooth extractions    . Left hip orif  January 2013.    Dr. Hilda Lias.  . Compression hip screw  02/01/2011    Procedure: COMPRESSION HIP;  Surgeon: Darreld Mclean, [name];  Location: [facility];  Service: Orthopedics;  Laterality: Left;  . Orif femur fracture  02/01/2011    Procedure: OPEN REDUCTION INTERNAL FIXATION (ORIF) DISTAL FEMUR FRACTURE;  Surgeon: Darreld Mclean, [name];  Location: [facility];  Service: Orthopedics;  Laterality: Left;    REVIEW OF SYSTEMS: As in the history above otherwise negative.  PHYSICAL EXAMINATION:  GENERAL: No distress, Obese.  SKIN:  Few mild ecchymosis in the sun exposed areas on the forearms  LYMPH: No palpable lymphadenopathy,  LUNGS: clear to auscultation , no crackles or wheezes HEART: regular rate & rhythm, no murmurs, no gallops, S1 normal and S2 normal  ABDOMEN: Abdomen soft, non-tender, normal bowel sounds, no masses or organomegaly and no hepatosplenomegaly  EXTREMITIES:  No edema, no skin discoloration or tenderness   Blood pressure 151/58, pulse 53, temperature 97.9 F (36.6 C), temperature source Oral, resp. rate 18, weight 128.005 kg (282 lb 3.2 oz).  LABORATORY DATA: Lab Results  Component Value Date   WBC 6.1 05/30/2012   HGB 13.2 05/30/2012   HCT 40.0 05/30/2012   MCV 89.9 05/30/2012   PLT 143* 05/30/2012      Chemistry      Component Value Date/Time   NA 139 11/13/2011 0847   K 3.8 11/13/2011 0847   CL 104 11/13/2011 0847   CO2 28 11/13/2011 0847   BUN 15 11/13/2011 0847   CREATININE 1.08 11/13/2011 0847      Component Value Date/Time   CALCIUM 9.1 11/13/2011 0847   ALKPHOS 116 11/13/2011 0847   AST 14 11/13/2011 0847   ALT 13 11/13/2011 0847   BILITOT 0.4 11/13/2011 0847       RADIOGRAPHIC  STUDIES: No results found.  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 Levels are almost normal, well above 100k. I doubt if further work up is indicated at this time . His mild bruising is likely related to celexa.   PLAN:  1.RTC in 6 months for continued observation. 2.I explained the diagnosis to him and what he need to expect as he had asked. 3.Repeat CBC at the next visit.   All questions were answered. The patient knows to call the clinic with any problems, questions or concerns. We can certainly see the patient much sooner if necessary.  I spent 15 minutes on counseling the patient face to face. The total time spent in the appointment was 30 minutes.     Bruce Whyte,[name],FACP. Hematologist/Oncologist.

## 2012-11-06 ENCOUNTER — Other Ambulatory Visit (HOSPITAL_COMMUNITY): Payer: Self-pay | Admitting: Orthopaedic Surgery

## 2012-11-07 ENCOUNTER — Ambulatory Visit (HOSPITAL_COMMUNITY)
Admission: RE | Admit: 2012-11-07 | Discharge: 2012-11-07 | Disposition: A | Discharge status: Home / Self Care | Payer: Medicare Other | Source: Ambulatory Visit | Attending: Orthopaedic Surgery | Admitting: Orthopaedic Surgery

## 2012-11-07 DIAGNOSIS — T84498A Other mechanical complication of other internal orthopedic devices, implants and grafts, initial encounter: Secondary | ICD-10-CM | POA: Insufficient documentation

## 2012-11-07 DIAGNOSIS — Y838 Other surgical procedures as the cause of abnormal reaction of the patient, or of later complication, without mention of misadventure at the time of the procedure: Secondary | ICD-10-CM | POA: Insufficient documentation

## 2012-11-25 ENCOUNTER — Encounter (HOSPITAL_COMMUNITY): Payer: Medicare Other | Attending: Hematology and Oncology

## 2012-11-26 NOTE — Progress Notes (Signed)
-  No show, letter sent-   

## 2012-11-27 ENCOUNTER — Ambulatory Visit (HOSPITAL_COMMUNITY): Payer: BC Managed Care – PPO | Admitting: Oncology

## 2012-12-06 ENCOUNTER — Other Ambulatory Visit (HOSPITAL_COMMUNITY): Payer: Self-pay | Admitting: Orthopaedic Surgery

## 2012-12-06 DIAGNOSIS — M25562 Pain in left knee: Secondary | ICD-10-CM

## 2012-12-09 ENCOUNTER — Ambulatory Visit: Payer: Medicare Other | Admitting: Physical Therapy

## 2012-12-10 ENCOUNTER — Ambulatory Visit (HOSPITAL_COMMUNITY)
Admission: RE | Admit: 2012-12-10 | Discharge: 2012-12-10 | Disposition: A | Discharge status: Home / Self Care | Payer: Medicare Other | Source: Ambulatory Visit | Attending: Orthopaedic Surgery | Admitting: Orthopaedic Surgery

## 2012-12-10 DIAGNOSIS — M25562 Pain in left knee: Secondary | ICD-10-CM

## 2012-12-25 ENCOUNTER — Encounter (HOSPITAL_COMMUNITY): Payer: Self-pay | Admitting: Oncology

## 2013-01-03 ENCOUNTER — Other Ambulatory Visit (HOSPITAL_COMMUNITY): Payer: Self-pay | Admitting: Oncology

## 2013-01-07 ENCOUNTER — Telehealth (HOSPITAL_COMMUNITY): Payer: Self-pay | Admitting: *Deleted

## 2013-01-07 ENCOUNTER — Other Ambulatory Visit (HOSPITAL_COMMUNITY): Payer: Self-pay | Admitting: Hematology and Oncology

## 2013-01-07 MED ORDER — CITALOPRAM HYDROBROMIDE 20 MG PO TABS: ORAL_TABLET | ORAL | Status: DC

## 2013-01-07 NOTE — Telephone Encounter (Signed)
Wegner wife called and needs refill on Citalopram 20 MG tabs at the [facility] in [place].

## 2013-01-22 DIAGNOSIS — M25559 Pain in unspecified hip: Secondary | ICD-10-CM | POA: Diagnosis not present

## 2013-01-23 DIAGNOSIS — R21 Rash and other nonspecific skin eruption: Secondary | ICD-10-CM | POA: Diagnosis not present

## 2013-01-30 ENCOUNTER — Encounter (HOSPITAL_COMMUNITY): Payer: Self-pay | Admitting: Oncology

## 2013-02-05 IMAGING — US US ABDOMEN LIMITED
1 series · 14 of 14 positions shown · non-contrast
Comparison: 03/19/2009 CT.

CLINICAL DATA: Thrombocytopenia.  Evaluate spleen size.

ABDOMEN ULTRASOUND LIMITED
TECHNIQUE: Imaging of the spleen was performed with measurements of
three dimensions.  The volume was calculated.

[Series 1: us abdomen limited · 0.30mm/px · 14 of 14 slices shown]
[im 1/14]
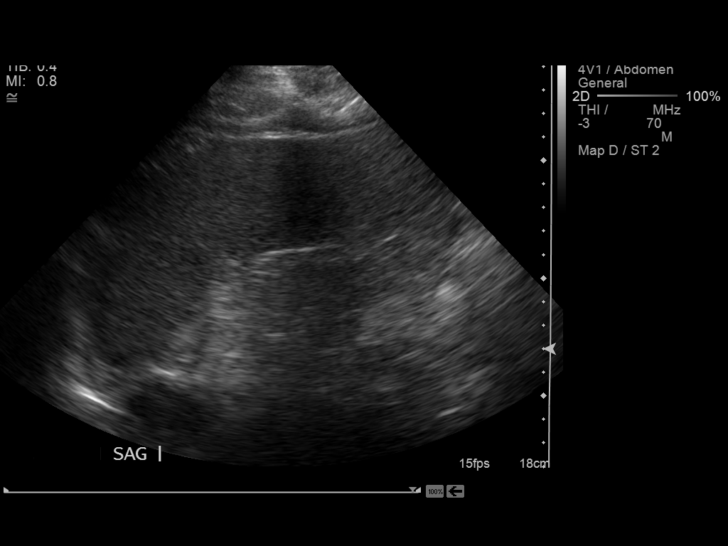
[im 2/14]
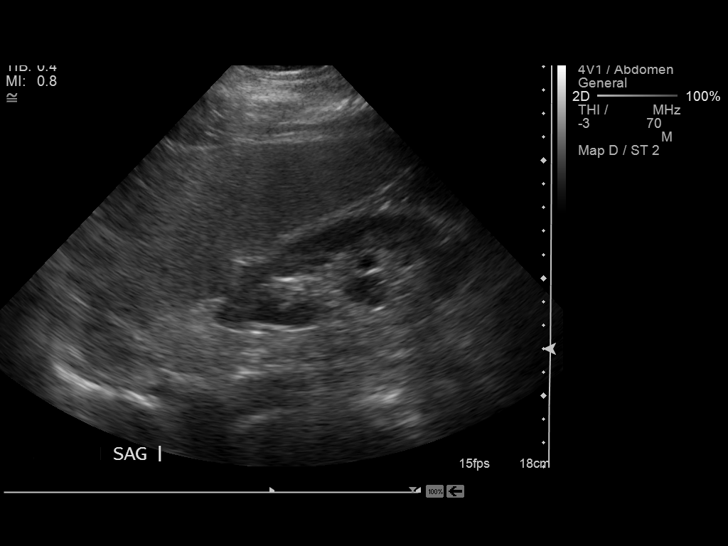
[im 3/14]
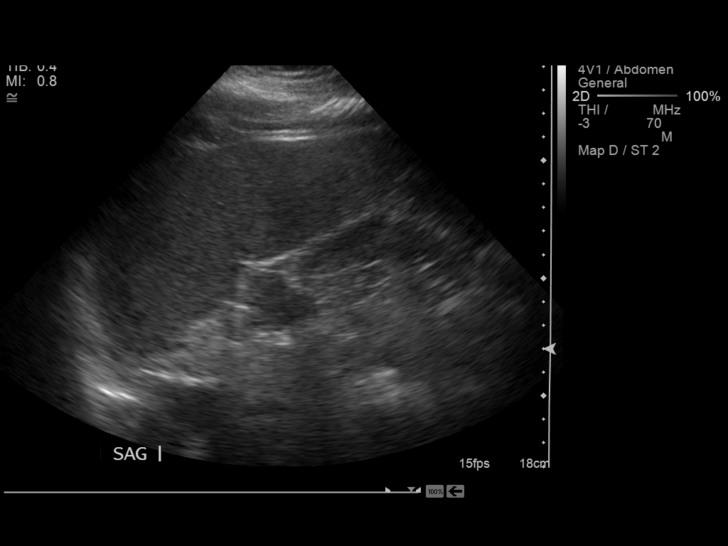
[im 4/14]
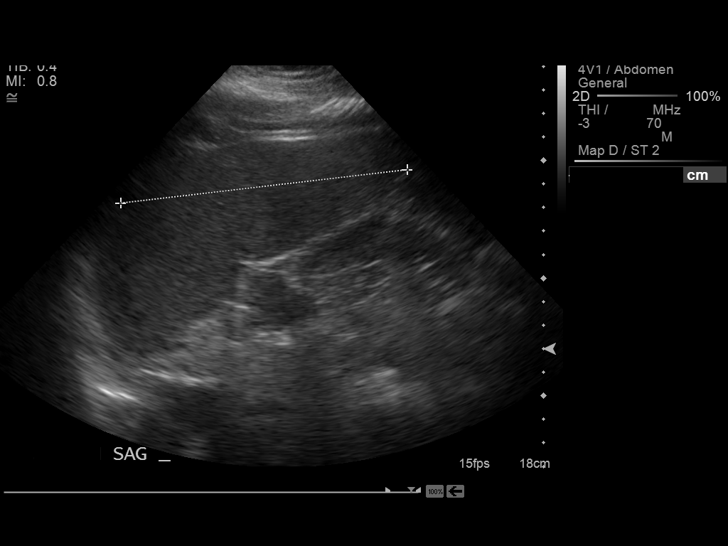
[im 5/14]
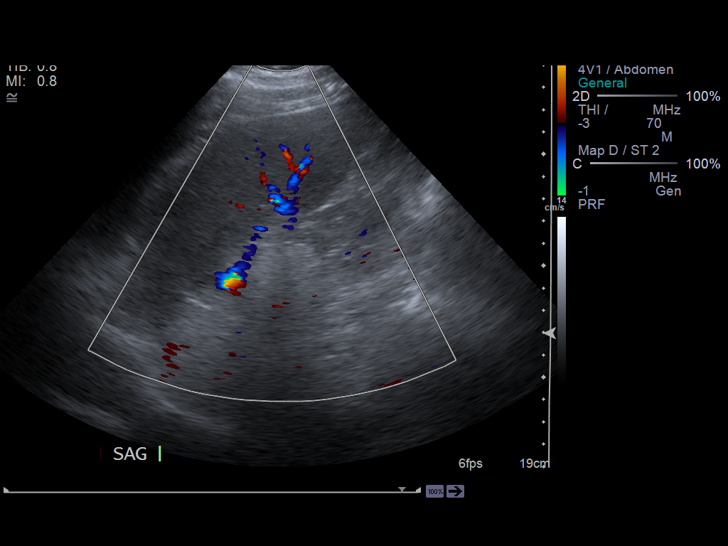
[im 6/14]
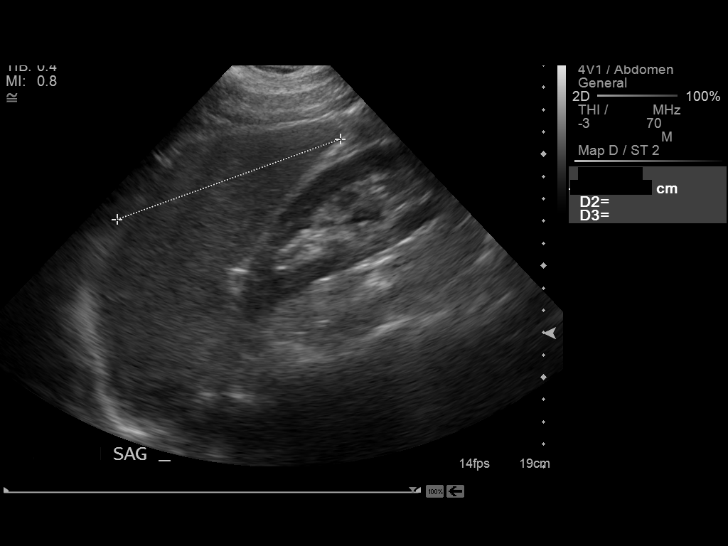
[im 7/14]
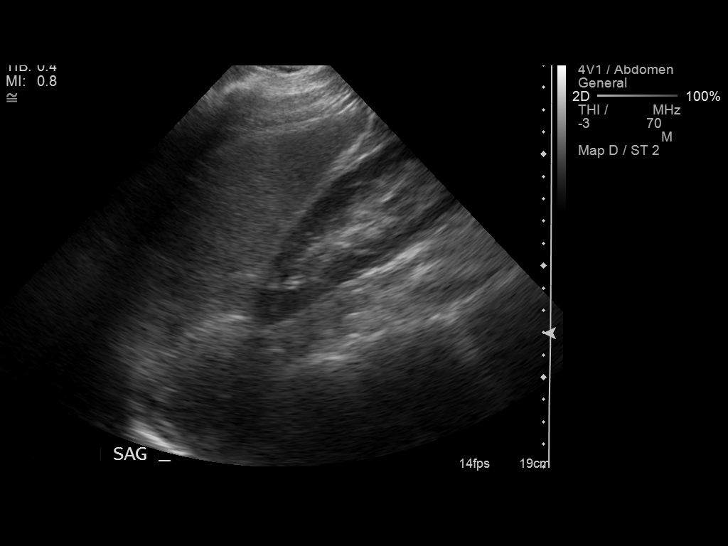
[im 8/14]
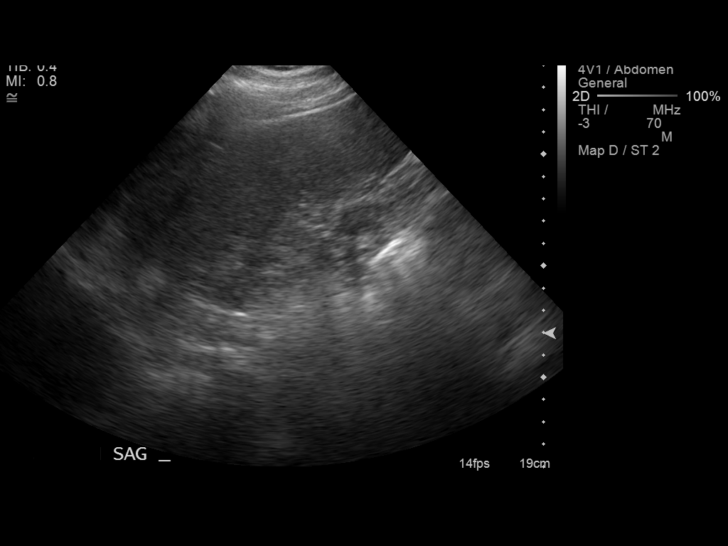
[im 9/14]
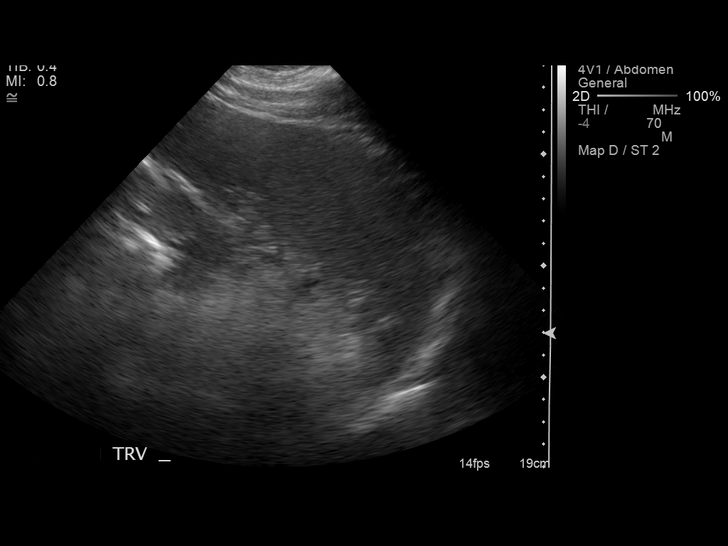
[im 10/14]
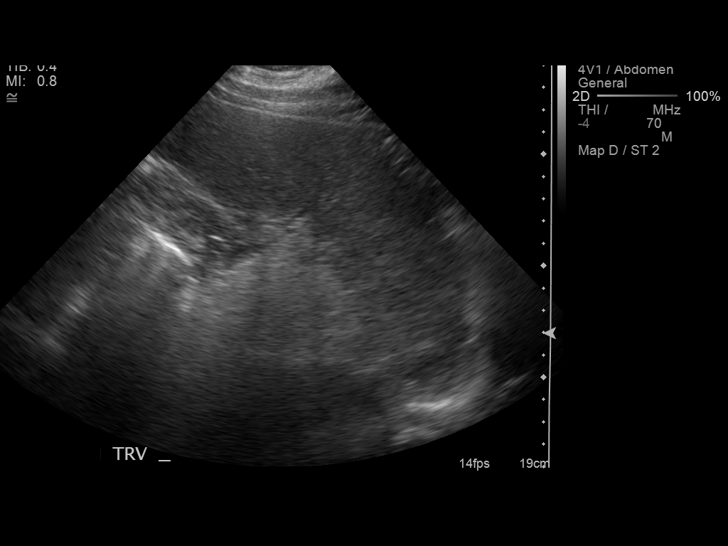
[im 11/14]
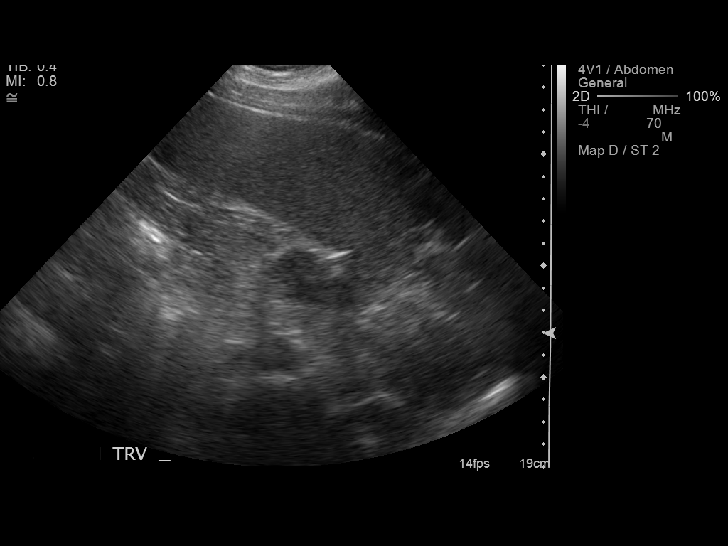
[im 12/14]
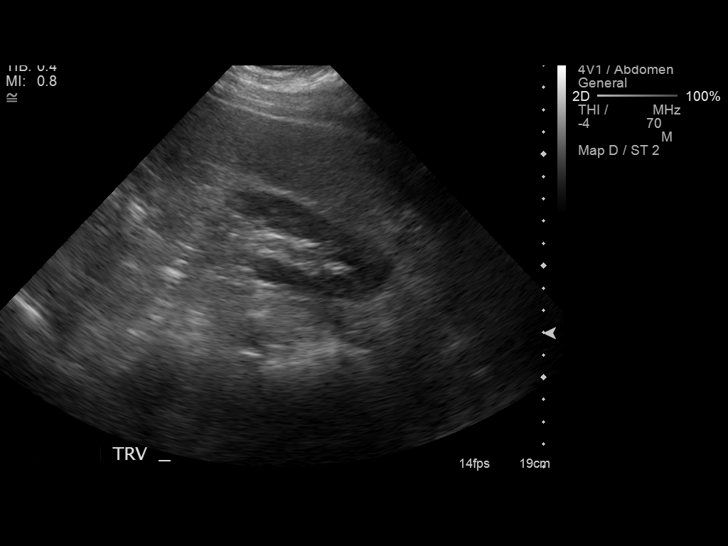
[im 13/14]
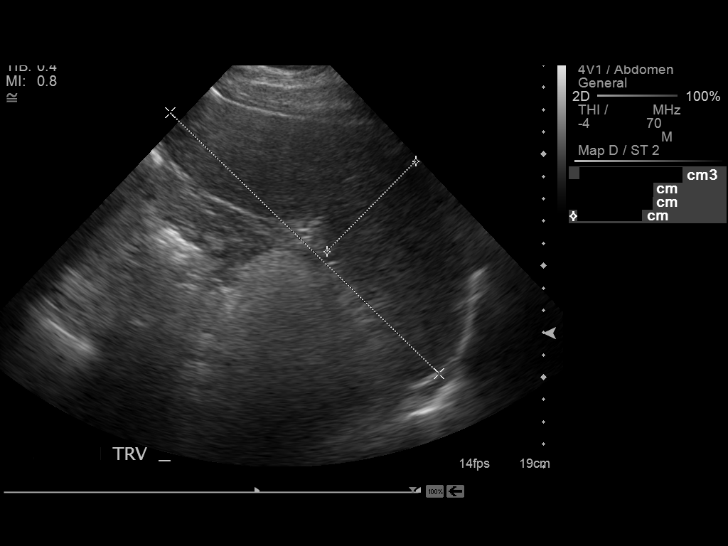
[im 14/14]
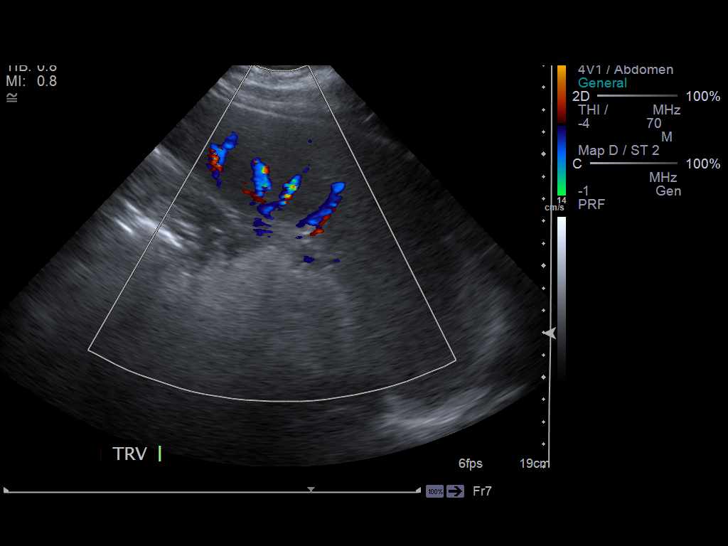

[14 of 14 positions shown; findings below may reference images not displayed]

FINDINGS: Maximal diameter of the spleen is 12.3 cm.  The splenic
volume is 532 ml.
IMPRESSION: The spleen is borderline enlarged with a volume of 532 ml.  This is
subjectively larger than on the prior CT.

## 2013-03-05 DIAGNOSIS — M25559 Pain in unspecified hip: Secondary | ICD-10-CM | POA: Diagnosis not present

## 2013-03-05 DIAGNOSIS — M25569 Pain in unspecified knee: Secondary | ICD-10-CM | POA: Diagnosis not present

## 2013-03-07 ENCOUNTER — Encounter (HOSPITAL_COMMUNITY): Payer: Self-pay | Admitting: Oncology

## 2013-04-16 DIAGNOSIS — M25559 Pain in unspecified hip: Secondary | ICD-10-CM | POA: Diagnosis not present

## 2013-04-21 DIAGNOSIS — M25559 Pain in unspecified hip: Secondary | ICD-10-CM | POA: Diagnosis not present

## 2013-04-21 DIAGNOSIS — Z9889 Other specified postprocedural states: Secondary | ICD-10-CM | POA: Diagnosis not present

## 2013-04-21 DIAGNOSIS — S72309A Unspecified fracture of shaft of unspecified femur, initial encounter for closed fracture: Secondary | ICD-10-CM | POA: Diagnosis not present

## 2013-04-23 DIAGNOSIS — M25559 Pain in unspecified hip: Secondary | ICD-10-CM | POA: Diagnosis not present

## 2013-04-23 NOTE — H&P (Addendum)
Bruce Fears, MD   Bruce Borg, PA-C [address], [place], [place]  [address]                             [phone]   [place] MRN:  [id number] DOB/SEX:  1945/02/28/male  CHIEF COMPLAINT:  Painful left thigh  HISTORY:   Have been following Bruce [name] for the painful left thigh that appears to be related to a symptomatic compression screw and long side plate.  The initial surgery was performed by Dr. 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  It is not going to be that easy because of the broken screws and the wires, but I think that as long as the fracture is healed then removing the plate would alleviate his symptoms.  He is really only painful along the very tip of the plate and I suspect he has developed a bursitis.  CT scan this week and it looks like the fracture has healed.  PAST MEDICAL HISTORY: Patient Active Problem List   Diagnosis Date Noted  . Asthma 12/01/2011  . Closed left subtrochanteric femur fracture 02/07/2011  . Wrist fracture, left 02/07/2011  . Bradycardia 02/05/2011  . Depression 02/04/2011  . Confusion 02/04/2011  . Hyponatremia 02/03/2011  . Anemia 02/02/2011  . COPD (chronic obstructive pulmonary disease) 01/31/2011  . Hip fracture, intertrochanteric 01/31/2011  . Wrist fracture 01/31/2011  . Thrombocytopenia 01/31/2011  . GOUT, UNSPECIFIED 02/15/2009  . Eosinophilia 02/15/2009  . ALLERGIC RHINITIS,  SEASONAL 02/15/2009  . RECTAL BLEEDING 02/15/2009  . ARTHRITIS 02/15/2009   Past Medical History  Diagnosis Date  . Eosinophilia   . Thrombocytopenia   . COPD (chronic obstructive pulmonary disease)   . Obesity   . Degenerative joint disease   . Depression   . Gout   . Anemia 02/02/2011  . Hyperlipidemia   . Hip fracture, left January 2013.    Status post ORIF  . Wrist fracture, left   . Laceration of finger, index     left  . Asthma 12/01/2011    On symbicort 2 puffs BID  . Family history of anesthesia complication     brother had confusion after CABG  . Shortness of breath     with exertion  . Sinus disease   . GERD (gastroesophageal reflux disease)     takes Tums   Past Surgical History  Procedure Laterality Date  . Multiple tooth extractions    . Left hip orif  January 2013.    Dr. Luna [name].  . Compression hip screw  02/01/2011    Procedure: COMPRESSION HIP;  Surgeon: Bruce Kava, MD;  Location: [facility];  Service: Orthopedics;  Laterality: Left;  . Orif femur fracture  02/01/2011    Procedure: OPEN REDUCTION INTERNAL FIXATION (ORIF) DISTAL FEMUR FRACTURE;  Surgeon: Bruce Kava, MD;  Location: [facility];  Service: Orthopedics;  Laterality: Left;  . Colonoscopy w/ biopsies and polypectomy       MEDICATIONS:   Prescriptions prior to admission  Medication Sig Dispense Refill  . atorvastatin (LIPITOR) 10 MG tablet Take 10 mg by mouth at bedtime.       . budesonide-formoterol (SYMBICORT) 80-4.5 MCG/ACT inhaler Inhale 2 puffs into the lungs 2 (two) times daily.        . citalopram (CELEXA) 20 MG tablet Take 20 mg by mouth daily.      . fish oil-omega-3 fatty acids 1000 MG capsule Take 1,000 mg by mouth at bedtime.       . methocarbamol (ROBAXIN) 500 MG tablet Take 500 mg by mouth every 8 (eight) hours as needed for muscle spasms.       . naproxen (NAPROSYN) 500 MG tablet Take 500 mg by mouth 2 (two) times daily with a meal.      . oxyCODONE-acetaminophen (PERCOCET) 5-325  MG per tablet Take 1 tablet by mouth every 6 (six) hours as needed (pain).         ALLERGIES:   Allergies  Allergen Reactions  . Morphine And Related Itching and Nausea Only    "makes him fell sick and talk out of his head"    REVIEW OF SYSTEMS:  A comprehensive review of systems was negative.  See above PMH   FAMILY HISTORY:   Family History  Problem Relation Age of Onset  . Cancer Mother   . Emphysema Father   . Cancer Sister   . Cancer Brother     SOCIAL HISTORY:   History  Substance Use Topics  . Smoking status: Former Smoker -- 4.00 packs/day for 20 years    Types: Cigarettes    Quit date: 05/13/1983  . Smokeless tobacco: Current User    Types: Chew  . Alcohol Use: Yes     Comment: at times rarely      EXAMINATION: Vital signs in last 24 hours: Temp:  [97.6 F (36.4 C)] 97.6 F (36.4 C) (05/05 0845) Pulse Rate:  [52] 52 (05/05 0845) BP: (162)/(56) 162/56 mmHg (05/05 0845) SpO2:  [98 %] 98 % (05/05 0845) Weight:  [124.399 kg (274 lb 4 oz)] 124.399 kg (274 lb 4 oz) (05/05 0845)  Head is normocephalic.   Eyes:  Pupils equal, round and reactive to light and accommodation.  Extraocular intact. ENT: Ears, nose, and throat were benign.   Neck: supple, no bruits were noted.   Chest: good expansion.   Lungs: essentially clear.   Cardiac: regular rhythm and rate, normal S1, S2.  No murmurs appreciated. Pulses :  1+ bilateral and symmetric in lower extremities. Abdomen is scaphoid, soft, nontender, no masses palpable, normal bowel sounds  present. CNS:  He is oriented x3 and cranial nerves II-XII grossly intact. Breast, rectal, and genital exams: not performed and not indicated for an orthopedic evaluation. Musculoskeletal: He had painless range of motion of his hip and maybe a little loss of terminal/internal rotation but really not a functional loss   Imaging Review CT scan looks like the fracture has healed.  ASSESSMENT: left retained hardware hip  Past  Medical History  Diagnosis Date  . Eosinophilia   . Thrombocytopenia   . COPD (chronic obstructive pulmonary disease)   . Obesity   . Degenerative joint disease   . Depression   . Gout   . Anemia 02/02/2011  . Hyperlipidemia   . Hip fracture, left January 2013.    Status  post ORIF  . Wrist fracture, left   . Laceration of finger, index     left  . Asthma 12/01/2011    On symbicort 2 puffs BID  . Family history of anesthesia complication     brother had confusion after CABG  . Shortness of breath     with exertion  . Sinus disease   . GERD (gastroesophageal reflux disease)     takes Tums    PLAN: Plan for left hardware removal with local allograft bone grafting  The procedure,  risks, and benefits of surgery were presented and reviewed. The risks including but not limited to infection, blood clots, vascular and nerve injury, stiffness,  among others were discussed. The patient acknowledged the explanation, agreed to proceed.   Bruce [name] 05/20/2013, 9:58 AM

## 2013-05-05 DIAGNOSIS — J019 Acute sinusitis, unspecified: Secondary | ICD-10-CM | POA: Diagnosis not present

## 2013-05-05 DIAGNOSIS — J40 Bronchitis, not specified as acute or chronic: Secondary | ICD-10-CM | POA: Diagnosis not present

## 2013-05-06 ENCOUNTER — Encounter (HOSPITAL_COMMUNITY): Payer: Self-pay | Admitting: Pharmacy Technician

## 2013-05-09 NOTE — Pre-Procedure Instructions (Signed)
Bruce [name]  05/09/2013   Your procedure is scheduled on:  Tues, May 5 @ 10:30 AM  Report to [facility]  at 8:30 AM.  Call this number if you have problems the morning of surgery: [phone]   Remember:   Do not eat food or drink liquids after midnight.   Take these medicines the morning of surgery with A SIP OF WATER: Symbicort(Budesonide)<Bring Your Inhaler With You>,Celexa(Citalopram),and Pain Pill(if needed)               Stop taking your Naproxen and Fish Oil. No Goody's,BC's,Ibuprofen,Aspirin,or any Herbal Medications   Do not wear jewelry  Do not wear lotions, powders, or colognes. You may wear deodorant.  Men may shave face and neck.  Do not bring valuables to the hospital.  [facility] is not responsible                  for any belongings or valuables.               Contacts, dentures or bridgework may not be worn into surgery.  Leave suitcase in the car. After surgery it may be brought to your room.  For patients admitted to the hospital, discharge time is determined by your                treatment team.               Patients discharged the day of surgery will not be allowed to drive  home.    Special Instructions:  [place] - Preparing for Surgery  Before surgery, you can play an important role.  Because skin is not sterile, your skin needs to be as free of germs as possible.  You can reduce the number of germs on you skin by washing with CHG (chlorahexidine gluconate) soap before surgery.  CHG is an antiseptic cleaner which kills germs and bonds with the skin to continue killing germs even after washing.  Please DO NOT use if you have an allergy to CHG or antibacterial soaps.  If your skin becomes reddened/irritated stop using the CHG and inform your nurse when you arrive at Short Stay.  Do not shave (including legs and underarms) for at least 48 hours prior to the first CHG shower.  You may shave your face.  Please follow these instructions  carefully:   1.  Shower with CHG Soap the night before surgery and the                                morning of Surgery.  2.  If you choose to wash your hair, wash your hair first as usual with your       normal shampoo.  3.  After you shampoo, rinse your hair and body thoroughly to remove the                      Shampoo.  4.  Use CHG as you would any other liquid soap.  You can apply chg directly       to the skin and wash gently with scrungie or a clean washcloth.  5.  Apply the CHG Soap to your body ONLY FROM THE NECK DOWN.        Do not use on open wounds or open sores.  Avoid contact with your eyes,       ears,  mouth and genitals (private parts).  Wash genitals (private parts)       with your normal soap.  6.  Wash thoroughly, paying special attention to the area where your surgery        will be performed.  7.  Thoroughly rinse your body with warm water from the neck down.  8.  DO NOT shower/wash with your normal soap after using and rinsing off       the CHG Soap.  9.  Pat yourself dry with a clean towel.            10.  Wear clean pajamas.            11.  Place clean sheets on your bed the night of your first shower and do not        sleep with pets.  Day of Surgery  Do not apply any lotions/deoderants the morning of surgery.  Please wear clean clothes to the hospital/surgery center.     Please read over the following fact sheets that you were given: Pain Booklet, Coughing and Deep Breathing and Surgical Site Infection Prevention

## 2013-05-12 ENCOUNTER — Encounter (HOSPITAL_COMMUNITY): Payer: Self-pay

## 2013-05-12 ENCOUNTER — Encounter (HOSPITAL_COMMUNITY)
Admission: RE | Admit: 2013-05-12 | Discharge: 2013-05-12 | Disposition: A | Discharge status: Home / Self Care | Payer: Medicare Other | Source: Ambulatory Visit | Attending: Orthopedic Surgery | Admitting: Orthopedic Surgery

## 2013-05-12 ENCOUNTER — Encounter (HOSPITAL_COMMUNITY)
Admission: RE | Admit: 2013-05-12 | Discharge: 2013-05-12 | Disposition: A | Discharge status: Home / Self Care | Payer: Medicare Other | Source: Ambulatory Visit | Attending: Orthopaedic Surgery | Admitting: Orthopaedic Surgery

## 2013-05-12 DIAGNOSIS — Z01812 Encounter for preprocedural laboratory examination: Secondary | ICD-10-CM | POA: Diagnosis not present

## 2013-05-12 DIAGNOSIS — J449 Chronic obstructive pulmonary disease, unspecified: Secondary | ICD-10-CM | POA: Insufficient documentation

## 2013-05-12 DIAGNOSIS — Z01818 Encounter for other preprocedural examination: Secondary | ICD-10-CM | POA: Insufficient documentation

## 2013-05-12 DIAGNOSIS — Z01810 Encounter for preprocedural cardiovascular examination: Secondary | ICD-10-CM | POA: Diagnosis not present

## 2013-05-12 DIAGNOSIS — J4489 Other specified chronic obstructive pulmonary disease: Secondary | ICD-10-CM | POA: Insufficient documentation

## 2013-05-12 HISTORY — DX: Gastro-esophageal reflux disease without esophagitis: K21.9

## 2013-05-12 HISTORY — DX: Family history of other specified conditions: Z84.89

## 2013-05-12 HISTORY — DX: Unspecified disorder of nose and nasal sinuses: J34.9

## 2013-05-12 HISTORY — DX: Shortness of breath: R06.02

## 2013-05-12 LAB — CBC WITH DIFFERENTIAL/PLATELET
BASOS ABS: 0 10*3/uL (ref 0.0–0.1)
Basophils Relative: 0 % (ref 0–1)
EOS PCT: 6 % — AB (ref 0–5)
Eosinophils Absolute: 0.6 10*3/uL (ref 0.0–0.7)
HCT: 39.8 % (ref 39.0–52.0)
Hemoglobin: 13 g/dL (ref 13.0–17.0)
Lymphocytes Relative: 14 % (ref 12–46)
Lymphs Abs: 1.3 10*3/uL (ref 0.7–4.0)
MCH: 28.8 pg (ref 26.0–34.0)
MCHC: 32.7 g/dL (ref 30.0–36.0)
MCV: 88.1 fL (ref 78.0–100.0)
Monocytes Absolute: 0.7 10*3/uL (ref 0.1–1.0)
Monocytes Relative: 8 % (ref 3–12)
Neutro Abs: 6.8 10*3/uL (ref 1.7–7.7)
Neutrophils Relative %: 72 % (ref 43–77)
PLATELETS: 160 10*3/uL (ref 150–400)
RBC: 4.52 MIL/uL (ref 4.22–5.81)
RDW: 13.5 % (ref 11.5–15.5)
WBC: 9.4 10*3/uL (ref 4.0–10.5)

## 2013-05-12 LAB — COMPREHENSIVE METABOLIC PANEL
ALT: 10 U/L (ref 0–53)
AST: 12 U/L (ref 0–37)
Albumin: 3.6 g/dL (ref 3.5–5.2)
Alkaline Phosphatase: 101 U/L (ref 39–117)
BUN: 17 mg/dL (ref 6–23)
CO2: 27 mEq/L (ref 19–32)
Calcium: 8.8 mg/dL (ref 8.4–10.5)
Chloride: 101 mEq/L (ref 96–112)
Creatinine, Ser: 1.17 mg/dL (ref 0.50–1.35)
GFR calc Af Amer: 74 mL/min — ABNORMAL LOW (ref >90)
GFR calc non Af Amer: 64 mL/min — ABNORMAL LOW (ref >90)
Glucose, Bld: 103 mg/dL — ABNORMAL HIGH (ref 70–99)
Potassium: 4.2 mEq/L (ref 3.7–5.3)
Sodium: 139 mEq/L (ref 137–147)
Total Bilirubin: 0.9 mg/dL (ref 0.3–1.2)
Total Protein: 6.9 g/dL (ref 6.0–8.3)

## 2013-05-12 LAB — URINALYSIS, ROUTINE W REFLEX MICROSCOPIC
Bilirubin Urine: NEGATIVE
Glucose, UA: NEGATIVE mg/dL
Hgb urine dipstick: NEGATIVE
Ketones, ur: NEGATIVE mg/dL
Nitrite: NEGATIVE
Protein, ur: NEGATIVE mg/dL
Specific Gravity, Urine: 1.026 (ref 1.005–1.030)
Urobilinogen, UA: 1 mg/dL (ref 0.0–1.0)
pH: 6 (ref 5.0–8.0)

## 2013-05-12 LAB — URINE MICROSCOPIC-ADD ON

## 2013-05-12 LAB — PROTIME-INR
INR: 1 (ref 0.00–1.49)
Prothrombin Time: 13 seconds (ref 11.6–15.2)

## 2013-05-12 LAB — APTT: aPTT: 28 seconds (ref 24–37)

## 2013-05-12 MED ORDER — CHLORHEXIDINE GLUCONATE 4 % EX LIQD: 60.0000 mL | Freq: Every day | CUTANEOUS | Status: DC

## 2013-05-12 NOTE — Progress Notes (Signed)
Stop-Bang tool score for Obstructive Sleep Apnea was faxed to patient's PCP, Dr Edrick Oh .

## 2013-05-13 LAB — URINE CULTURE
CULTURE: NO GROWTH
Colony Count: NO GROWTH

## 2013-05-19 MED ORDER — CHLORHEXIDINE GLUCONATE 4 % EX LIQD
60.0000 mL | Freq: Once | CUTANEOUS | Status: DC
  Filled 2013-05-19: qty 60

## 2013-05-19 MED ORDER — DEXTROSE 5 % IV SOLN
3.0000 g | INTRAVENOUS | Status: AC
  Administered 2013-05-20: 3 g via INTRAVENOUS
  Filled 2013-05-19: qty 3000

## 2013-05-19 MED ORDER — ACETAMINOPHEN 10 MG/ML IV SOLN
1000.0000 mg | Freq: Once | INTRAVENOUS | Status: AC
  Administered 2013-05-20: 1000 mg via INTRAVENOUS
  Filled 2013-05-19: qty 100

## 2013-05-20 ENCOUNTER — Encounter (HOSPITAL_COMMUNITY)
Admission: RE | Disposition: A | Discharge status: Home / Self Care | Payer: Self-pay | Source: Ambulatory Visit | Attending: Orthopaedic Surgery

## 2013-05-20 ENCOUNTER — Encounter (HOSPITAL_COMMUNITY): Payer: Self-pay | Admitting: Certified Registered Nurse Anesthetist

## 2013-05-20 ENCOUNTER — Encounter (HOSPITAL_COMMUNITY): Payer: Medicare Other | Admitting: Vascular Surgery

## 2013-05-20 ENCOUNTER — Observation Stay (HOSPITAL_COMMUNITY)
Admission: RE | Admit: 2013-05-20 | Discharge: 2013-05-21 | Disposition: A | Discharge status: Home / Self Care | Payer: Medicare Other | Source: Ambulatory Visit | Attending: Orthopaedic Surgery | Admitting: Orthopaedic Surgery

## 2013-05-20 ENCOUNTER — Ambulatory Visit (HOSPITAL_COMMUNITY): Payer: Medicare Other | Admitting: Certified Registered Nurse Anesthetist

## 2013-05-20 DIAGNOSIS — Y838 Other surgical procedures as the cause of abnormal reaction of the patient, or of later complication, without mention of misadventure at the time of the procedure: Secondary | ICD-10-CM | POA: Insufficient documentation

## 2013-05-20 DIAGNOSIS — M79609 Pain in unspecified limb: Secondary | ICD-10-CM | POA: Insufficient documentation

## 2013-05-20 DIAGNOSIS — F3289 Other specified depressive episodes: Secondary | ICD-10-CM | POA: Insufficient documentation

## 2013-05-20 DIAGNOSIS — Z969 Presence of functional implant, unspecified: Secondary | ICD-10-CM

## 2013-05-20 DIAGNOSIS — E785 Hyperlipidemia, unspecified: Secondary | ICD-10-CM | POA: Diagnosis not present

## 2013-05-20 DIAGNOSIS — Z87891 Personal history of nicotine dependence: Secondary | ICD-10-CM | POA: Insufficient documentation

## 2013-05-20 DIAGNOSIS — Z9889 Other specified postprocedural states: Secondary | ICD-10-CM

## 2013-05-20 DIAGNOSIS — Z6841 Body Mass Index (BMI) 40.0 and over, adult: Secondary | ICD-10-CM | POA: Diagnosis not present

## 2013-05-20 DIAGNOSIS — T84498A Other mechanical complication of other internal orthopedic devices, implants and grafts, initial encounter: Secondary | ICD-10-CM | POA: Diagnosis not present

## 2013-05-20 DIAGNOSIS — J449 Chronic obstructive pulmonary disease, unspecified: Secondary | ICD-10-CM | POA: Diagnosis not present

## 2013-05-20 DIAGNOSIS — M25559 Pain in unspecified hip: Secondary | ICD-10-CM | POA: Diagnosis not present

## 2013-05-20 DIAGNOSIS — M1610 Unilateral primary osteoarthritis, unspecified hip: Secondary | ICD-10-CM | POA: Diagnosis not present

## 2013-05-20 DIAGNOSIS — Z860100 Personal history of colon polyps, unspecified: Secondary | ICD-10-CM | POA: Insufficient documentation

## 2013-05-20 DIAGNOSIS — K219 Gastro-esophageal reflux disease without esophagitis: Secondary | ICD-10-CM | POA: Diagnosis not present

## 2013-05-20 DIAGNOSIS — M109 Gout, unspecified: Secondary | ICD-10-CM | POA: Insufficient documentation

## 2013-05-20 DIAGNOSIS — T8489XA Other specified complication of internal orthopedic prosthetic devices, implants and grafts, initial encounter: Principal | ICD-10-CM | POA: Insufficient documentation

## 2013-05-20 DIAGNOSIS — M169 Osteoarthritis of hip, unspecified: Secondary | ICD-10-CM | POA: Insufficient documentation

## 2013-05-20 DIAGNOSIS — J45909 Unspecified asthma, uncomplicated: Secondary | ICD-10-CM | POA: Diagnosis not present

## 2013-05-20 DIAGNOSIS — F329 Major depressive disorder, single episode, unspecified: Secondary | ICD-10-CM | POA: Insufficient documentation

## 2013-05-20 HISTORY — PX: HARDWARE REMOVAL: SHX979

## 2013-05-20 SURGERY — REMOVAL, HARDWARE
Anesthesia: General | Site: Leg Upper | Laterality: Left

## 2013-05-20 MED ORDER — ROCURONIUM BROMIDE 100 MG/10ML IV SOLN
INTRAVENOUS | Status: DC | PRN
  Administered 2013-05-20: 5 mg via INTRAVENOUS

## 2013-05-20 MED ORDER — METHOCARBAMOL 500 MG PO TABS
500.0000 mg | ORAL_TABLET | Freq: Three times a day (TID) | ORAL | Status: DC | PRN
  Administered 2013-05-20: 500 mg via ORAL
  Filled 2013-05-20: qty 1

## 2013-05-20 MED ORDER — PROPOFOL 10 MG/ML IV BOLUS
INTRAVENOUS | Status: AC
  Filled 2013-05-20: qty 20

## 2013-05-20 MED ORDER — CEFAZOLIN SODIUM-DEXTROSE 2-3 GM-% IV SOLR
2.0000 g | Freq: Four times a day (QID) | INTRAVENOUS | Status: AC
  Administered 2013-05-20 – 2013-05-21 (×3): 2 g via INTRAVENOUS
  Filled 2013-05-20 (×4): qty 50

## 2013-05-20 MED ORDER — KETOROLAC TROMETHAMINE 30 MG/ML IJ SOLN
15.0000 mg | Freq: Once | INTRAMUSCULAR | Status: AC | PRN
  Administered 2013-05-20: 30 mg via INTRAVENOUS

## 2013-05-20 MED ORDER — MIDAZOLAM HCL 2 MG/2ML IJ SOLN
INTRAMUSCULAR | Status: AC
  Filled 2013-05-20: qty 2

## 2013-05-20 MED ORDER — FENTANYL CITRATE 0.05 MG/ML IJ SOLN
INTRAMUSCULAR | Status: AC
  Filled 2013-05-20: qty 5

## 2013-05-20 MED ORDER — ALBUTEROL SULFATE HFA 108 (90 BASE) MCG/ACT IN AERS
INHALATION_SPRAY | RESPIRATORY_TRACT | Status: AC
  Filled 2013-05-20: qty 6.7

## 2013-05-20 MED ORDER — ONDANSETRON HCL 4 MG/2ML IJ SOLN: 4.0000 mg | Freq: Four times a day (QID) | INTRAMUSCULAR | Status: DC | PRN

## 2013-05-20 MED ORDER — ROCURONIUM BROMIDE 50 MG/5ML IV SOLN
INTRAVENOUS | Status: AC
  Filled 2013-05-20: qty 1

## 2013-05-20 MED ORDER — ACETAMINOPHEN 10 MG/ML IV SOLN: INTRAVENOUS | Status: DC | PRN

## 2013-05-20 MED ORDER — LIDOCAINE HCL (CARDIAC) 20 MG/ML IV SOLN
INTRAVENOUS | Status: DC | PRN
  Administered 2013-05-20: 80 mg via INTRAVENOUS

## 2013-05-20 MED ORDER — FENTANYL CITRATE 0.05 MG/ML IJ SOLN
INTRAMUSCULAR | Status: AC
  Filled 2013-05-20: qty 2

## 2013-05-20 MED ORDER — DOCUSATE SODIUM 100 MG PO CAPS
100.0000 mg | ORAL_CAPSULE | Freq: Two times a day (BID) | ORAL | Status: DC
  Administered 2013-05-21: 100 mg via ORAL
  Filled 2013-05-20: qty 1

## 2013-05-20 MED ORDER — 0.9 % SODIUM CHLORIDE (POUR BTL) OPTIME
TOPICAL | Status: DC | PRN
  Administered 2013-05-20: 1000 mL

## 2013-05-20 MED ORDER — SUCCINYLCHOLINE CHLORIDE 20 MG/ML IJ SOLN
INTRAMUSCULAR | Status: AC
  Filled 2013-05-20: qty 1

## 2013-05-20 MED ORDER — FENTANYL CITRATE 0.05 MG/ML IJ SOLN
INTRAMUSCULAR | Status: DC | PRN
  Administered 2013-05-20: 50 ug via INTRAVENOUS
  Administered 2013-05-20: 25 ug via INTRAVENOUS
  Administered 2013-05-20: 100 ug via INTRAVENOUS
  Administered 2013-05-20: 50 ug via INTRAVENOUS
  Administered 2013-05-20: 25 ug via INTRAVENOUS
  Administered 2013-05-20: 50 ug via INTRAVENOUS
  Administered 2013-05-20: 100 ug via INTRAVENOUS
  Administered 2013-05-20 (×2): 25 ug via INTRAVENOUS
  Administered 2013-05-20: 50 ug via INTRAVENOUS

## 2013-05-20 MED ORDER — FENTANYL CITRATE 0.05 MG/ML IJ SOLN
25.0000 ug | INTRAMUSCULAR | Status: DC | PRN
  Administered 2013-05-20: 50 ug via INTRAVENOUS

## 2013-05-20 MED ORDER — BUPIVACAINE-EPINEPHRINE (PF) 0.25% -1:200000 IJ SOLN
INTRAMUSCULAR | Status: AC
  Filled 2013-05-20: qty 30

## 2013-05-20 MED ORDER — BISACODYL 10 MG RE SUPP: 10.0000 mg | Freq: Every day | RECTAL | Status: DC | PRN

## 2013-05-20 MED ORDER — MIDAZOLAM HCL 5 MG/5ML IJ SOLN
INTRAMUSCULAR | Status: DC | PRN
  Administered 2013-05-20: 2 mg via INTRAVENOUS

## 2013-05-20 MED ORDER — OXYCODONE HCL 5 MG PO TABS
5.0000 mg | ORAL_TABLET | ORAL | Status: DC | PRN
  Administered 2013-05-20 – 2013-05-21 (×4): 10 mg via ORAL
  Filled 2013-05-20 (×4): qty 2

## 2013-05-20 MED ORDER — BUDESONIDE-FORMOTEROL FUMARATE 80-4.5 MCG/ACT IN AERO
2.0000 | INHALATION_SPRAY | Freq: Two times a day (BID) | RESPIRATORY_TRACT | Status: DC
  Administered 2013-05-20 – 2013-05-21 (×2): 2 via RESPIRATORY_TRACT
  Filled 2013-05-20: qty 6.9

## 2013-05-20 MED ORDER — METHOCARBAMOL 500 MG PO TABS
ORAL_TABLET | ORAL | Status: AC
Start: 2013-05-20 — End: 2013-05-21
  Filled 2013-05-20: qty 1

## 2013-05-20 MED ORDER — PROMETHAZINE HCL 25 MG/ML IJ SOLN: 6.2500 mg | INTRAMUSCULAR | Status: DC | PRN

## 2013-05-20 MED ORDER — MIDAZOLAM HCL 2 MG/2ML IJ SOLN: 0.5000 mg | Freq: Once | INTRAMUSCULAR | Status: DC | PRN

## 2013-05-20 MED ORDER — EPHEDRINE SULFATE 50 MG/ML IJ SOLN
INTRAMUSCULAR | Status: AC
  Filled 2013-05-20: qty 1

## 2013-05-20 MED ORDER — BUPIVACAINE-EPINEPHRINE 0.25% -1:200000 IJ SOLN
INTRAMUSCULAR | Status: DC | PRN
  Administered 2013-05-20: 20 mL

## 2013-05-20 MED ORDER — METOCLOPRAMIDE HCL 5 MG PO TABS: 5.0000 mg | ORAL_TABLET | Freq: Three times a day (TID) | ORAL | Status: DC | PRN

## 2013-05-20 MED ORDER — KETOROLAC TROMETHAMINE 30 MG/ML IJ SOLN
30.0000 mg | Freq: Four times a day (QID) | INTRAMUSCULAR | Status: DC
Start: 2013-05-20 — End: 2013-05-20

## 2013-05-20 MED ORDER — EPHEDRINE SULFATE 50 MG/ML IJ SOLN
INTRAMUSCULAR | Status: DC | PRN
  Administered 2013-05-20 (×4): 5 mg via INTRAVENOUS
  Administered 2013-05-20 (×2): 10 mg via INTRAVENOUS

## 2013-05-20 MED ORDER — LACTATED RINGERS IV SOLN
INTRAVENOUS | Status: DC | PRN
  Administered 2013-05-20 (×2): via INTRAVENOUS

## 2013-05-20 MED ORDER — ONDANSETRON HCL 4 MG PO TABS: 4.0000 mg | ORAL_TABLET | Freq: Four times a day (QID) | ORAL | Status: DC | PRN

## 2013-05-20 MED ORDER — ONDANSETRON HCL 4 MG/2ML IJ SOLN
INTRAMUSCULAR | Status: AC
  Filled 2013-05-20: qty 2

## 2013-05-20 MED ORDER — LACTATED RINGERS IV SOLN
INTRAVENOUS | Status: DC
  Administered 2013-05-20: 10:00:00 via INTRAVENOUS

## 2013-05-20 MED ORDER — ONDANSETRON HCL 4 MG/2ML IJ SOLN
INTRAMUSCULAR | Status: DC | PRN
Start: 2013-05-20 — End: 2013-05-20
  Administered 2013-05-20: 4 mg via INTRAVENOUS

## 2013-05-20 MED ORDER — HYDROMORPHONE HCL PF 1 MG/ML IJ SOLN: 0.5000 mg | INTRAMUSCULAR | Status: DC | PRN

## 2013-05-20 MED ORDER — SUCCINYLCHOLINE CHLORIDE 20 MG/ML IJ SOLN
INTRAMUSCULAR | Status: DC | PRN
  Administered 2013-05-20: 100 mg via INTRAVENOUS

## 2013-05-20 MED ORDER — MEPERIDINE HCL 25 MG/ML IJ SOLN: 6.2500 mg | INTRAMUSCULAR | Status: DC | PRN

## 2013-05-20 MED ORDER — FLEET ENEMA 7-19 GM/118ML RE ENEM: 1.0000 | ENEMA | Freq: Once | RECTAL | Status: AC | PRN

## 2013-05-20 MED ORDER — ATORVASTATIN CALCIUM 10 MG PO TABS
10.0000 mg | ORAL_TABLET | Freq: Every day | ORAL | Status: DC
  Administered 2013-05-20: 10 mg via ORAL
  Filled 2013-05-20 (×2): qty 1

## 2013-05-20 MED ORDER — ACETAMINOPHEN 10 MG/ML IV SOLN
1000.0000 mg | Freq: Four times a day (QID) | INTRAVENOUS | Status: DC
  Administered 2013-05-20 – 2013-05-21 (×3): 1000 mg via INTRAVENOUS
  Filled 2013-05-20 (×4): qty 100

## 2013-05-20 MED ORDER — SODIUM CHLORIDE 0.9 % IV SOLN: INTRAVENOUS | Status: DC

## 2013-05-20 MED ORDER — KETOROLAC TROMETHAMINE 30 MG/ML IJ SOLN
INTRAMUSCULAR | Status: AC
Start: 2013-05-20 — End: 2013-05-21
  Filled 2013-05-20: qty 1

## 2013-05-20 MED ORDER — MAGNESIUM HYDROXIDE 400 MG/5ML PO SUSP
30.0000 mL | Freq: Every day | ORAL | Status: DC | PRN
  Administered 2013-05-20: 30 mL via ORAL
  Filled 2013-05-20: qty 30

## 2013-05-20 MED ORDER — STERILE WATER FOR INJECTION IJ SOLN
INTRAMUSCULAR | Status: AC
  Filled 2013-05-20: qty 10

## 2013-05-20 MED ORDER — CITALOPRAM HYDROBROMIDE 20 MG PO TABS
20.0000 mg | ORAL_TABLET | Freq: Every day | ORAL | Status: DC
Start: 2013-05-20 — End: 2013-05-21
  Administered 2013-05-20 – 2013-05-21 (×2): 20 mg via ORAL
  Filled 2013-05-20 (×2): qty 1

## 2013-05-20 MED ORDER — ALBUTEROL SULFATE HFA 108 (90 BASE) MCG/ACT IN AERS
INHALATION_SPRAY | RESPIRATORY_TRACT | Status: DC | PRN
  Administered 2013-05-20: 2 via RESPIRATORY_TRACT

## 2013-05-20 MED ORDER — METOCLOPRAMIDE HCL 5 MG/ML IJ SOLN: 5.0000 mg | Freq: Three times a day (TID) | INTRAMUSCULAR | Status: DC | PRN

## 2013-05-20 MED ORDER — LIDOCAINE HCL (CARDIAC) 20 MG/ML IV SOLN
INTRAVENOUS | Status: AC
  Filled 2013-05-20: qty 5

## 2013-05-20 MED ORDER — PROPOFOL 10 MG/ML IV BOLUS
INTRAVENOUS | Status: DC | PRN
  Administered 2013-05-20: 200 mg via INTRAVENOUS

## 2013-05-20 MED ORDER — SODIUM CHLORIDE 0.9 % IV SOLN
INTRAVENOUS | Status: DC
  Administered 2013-05-20: 75 mL/h via INTRAVENOUS
  Administered 2013-05-21: 06:00:00 via INTRAVENOUS

## 2013-05-20 SURGICAL SUPPLY — 75 items
BANDAGE ELASTIC 4 VELCRO ST LF (GAUZE/BANDAGES/DRESSINGS) IMPLANT
BANDAGE ELASTIC 6 VELCRO ST LF (GAUZE/BANDAGES/DRESSINGS) IMPLANT
BANDAGE ESMARK 6X9 LF (GAUZE/BANDAGES/DRESSINGS) IMPLANT
BANDAGE GAUZE ELAST BULKY 4 IN (GAUZE/BANDAGES/DRESSINGS) ×1 IMPLANT
BLADE 10 SAFETY STRL DISP (BLADE) ×1 IMPLANT
BLADE SURG 10 STRL SS (BLADE) ×2 IMPLANT
BNDG CMPR 9X6 STRL LF SNTH (GAUZE/BANDAGES/DRESSINGS)
BNDG COHESIVE 4X5 TAN STRL (GAUZE/BANDAGES/DRESSINGS) IMPLANT
BNDG COHESIVE 6X5 TAN STRL LF (GAUZE/BANDAGES/DRESSINGS) ×2 IMPLANT
BNDG ESMARK 6X9 LF (GAUZE/BANDAGES/DRESSINGS)
BONE CANC CHIPS 40CC CAN1/2 (Bone Implant) ×3 IMPLANT
CHIPS CANC BONE 40CC CAN1/2 (Bone Implant) ×1 IMPLANT
CONT SPECI 4OZ STER CLIK (MISCELLANEOUS) ×2 IMPLANT
COVER SURGICAL LIGHT HANDLE (MISCELLANEOUS) ×3 IMPLANT
CUFF TOURNIQUET SINGLE 34IN LL (TOURNIQUET CUFF) IMPLANT
CUFF TOURNIQUET SINGLE 44IN (TOURNIQUET CUFF) IMPLANT
DRAPE C-ARM 42X72 X-RAY (DRAPES) ×2 IMPLANT
DRAPE INCISE IOBAN 66X45 STRL (DRAPES) ×2 IMPLANT
DRAPE ORTHO SPLIT 77X108 STRL (DRAPES) ×6
DRAPE PROXIMA HALF (DRAPES) ×2 IMPLANT
DRAPE SURG ORHT 6 SPLT 77X108 (DRAPES) ×2 IMPLANT
DRSG EMULSION OIL 3X3 NADH (GAUZE/BANDAGES/DRESSINGS) ×1 IMPLANT
DRSG MEPILEX BORDER 4X12 (GAUZE/BANDAGES/DRESSINGS) ×2 IMPLANT
DRSG PAD ABDOMINAL 8X10 ST (GAUZE/BANDAGES/DRESSINGS) ×1 IMPLANT
ELECT REM PT RETURN 9FT ADLT (ELECTROSURGICAL) ×3
ELECTRODE REM PT RTRN 9FT ADLT (ELECTROSURGICAL) ×1 IMPLANT
FACESHIELD WRAPAROUND (MASK) ×3 IMPLANT
FACESHIELD WRAPAROUND OR TEAM (MASK) IMPLANT
GLOVE BIO SURGEON STRL SZ7 (GLOVE) ×4 IMPLANT
GLOVE BIOGEL M STRL SZ7.5 (GLOVE) ×3 IMPLANT
GLOVE BIOGEL PI IND STRL 7.0 (GLOVE) IMPLANT
GLOVE BIOGEL PI IND STRL 8 (GLOVE) ×1 IMPLANT
GLOVE BIOGEL PI IND STRL 8.5 (GLOVE) IMPLANT
GLOVE BIOGEL PI INDICATOR 7.0 (GLOVE) ×4
GLOVE BIOGEL PI INDICATOR 8 (GLOVE) ×2
GLOVE BIOGEL PI INDICATOR 8.5 (GLOVE) ×2
GLOVE ECLIPSE 8.0 STRL XLNG CF (GLOVE) ×5 IMPLANT
GLOVE SURG ORTHO 8.5 STRL (GLOVE) ×2 IMPLANT
GLOVE SURG SS PI 8.0 STRL IVOR (GLOVE) ×4 IMPLANT
GOWN STRL REUS W/ TWL LRG LVL3 (GOWN DISPOSABLE) ×3 IMPLANT
GOWN STRL REUS W/ TWL XL LVL3 (GOWN DISPOSABLE) IMPLANT
GOWN STRL REUS W/TWL 2XL LVL3 (GOWN DISPOSABLE) ×4 IMPLANT
GOWN STRL REUS W/TWL LRG LVL3 (GOWN DISPOSABLE) ×3
GOWN STRL REUS W/TWL XL LVL3 (GOWN DISPOSABLE) ×3
GRAFT BNE CHIP CANC 1-8 40 (Bone Implant) IMPLANT
KIT BASIN OR (CUSTOM PROCEDURE TRAY) ×3 IMPLANT
KIT ROOM TURNOVER OR (KITS) ×3 IMPLANT
MANIFOLD NEPTUNE II (INSTRUMENTS) ×3 IMPLANT
NDL HYPO 25GX1X1/2 BEV (NEEDLE) IMPLANT
NEEDLE HYPO 25GX1X1/2 BEV (NEEDLE) ×3 IMPLANT
NS IRRIG 1000ML POUR BTL (IV SOLUTION) ×3 IMPLANT
PACK GENERAL/GYN (CUSTOM PROCEDURE TRAY) ×3 IMPLANT
PAD ARMBOARD 7.5X6 YLW CONV (MISCELLANEOUS) ×6 IMPLANT
PAD CAST 4YDX4 CTTN HI CHSV (CAST SUPPLIES) ×1 IMPLANT
PADDING CAST COTTON 4X4 STRL (CAST SUPPLIES)
SPONGE GAUZE 4X4 12PLY (GAUZE/BANDAGES/DRESSINGS) ×1 IMPLANT
SPONGE LAP 18X18 X RAY DECT (DISPOSABLE) ×2 IMPLANT
STAPLER VISISTAT 35W (STAPLE) ×3 IMPLANT
STOCKINETTE IMPERVIOUS 9X36 MD (GAUZE/BANDAGES/DRESSINGS) ×2 IMPLANT
STOCKINETTE IMPERVIOUS LG (DRAPES) ×2 IMPLANT
SUCTION FRAZIER TIP 10 FR DISP (SUCTIONS) ×2 IMPLANT
SUCTION FRAZIER TIP 8 FR DISP (SUCTIONS) ×4
SUCTION TUBE FRAZIER 8FR DISP (SUCTIONS) IMPLANT
SUT ETHILON 4 0 FS 1 (SUTURE) IMPLANT
SUT MNCRL AB 3-0 PS2 18 (SUTURE) ×2 IMPLANT
SUT VIC AB 0 CT1 27 (SUTURE) ×6
SUT VIC AB 0 CT1 27XBRD ANBCTR (SUTURE) IMPLANT
SUT VIC AB 1 CT1 27 (SUTURE) ×6
SUT VIC AB 1 CT1 27XBRD ANBCTR (SUTURE) IMPLANT
SUT VIC AB 2-0 CT1 27 (SUTURE)
SUT VIC AB 2-0 CT1 TAPERPNT 27 (SUTURE) IMPLANT
SYR CONTROL 10ML LL (SYRINGE) ×2 IMPLANT
TOWEL OR 17X24 6PK STRL BLUE (TOWEL DISPOSABLE) ×1 IMPLANT
TOWEL OR 17X26 10 PK STRL BLUE (TOWEL DISPOSABLE) ×3 IMPLANT
WATER STERILE IRR 1000ML POUR (IV SOLUTION) ×1 IMPLANT

## 2013-05-20 NOTE — Op Note (Signed)
PATIENT ID:      VASILIOS OTTAWAY  MRN:     [id number] DOB/AGE:    1945-09-03 / 68 y.o.       OPERATIVE REPORT    DATE OF PROCEDURE:  05/20/2013       PREOPERATIVE DIAGNOSIS:   PAINFUL RETAINED HARDWARE LEFT FEMUR                                                       Estimated body mass index is 42.29 kg/(m^2) as calculated from the following:   Height as of this encounter: 5' 7.5" (1.715 m).   Weight as of this encounter: 124.399 kg (274 lb 4 oz).     POSTOPERATIVE DIAGNOSIS:   PAINFUL RETAINED HARDWARE LEFT FEMUR                                                                     Estimated body mass index is 42.29 kg/(m^2) as calculated from the following:   Height as of this encounter: 5' 7.5" (1.715 m).   Weight as of this encounter: 124.399 kg (274 lb 4 oz).     PROCEDURE:  Procedure(s): REMOVAL OF HARDWARE LEFT FEMUR WITH LOCAL BONE GRAFT left     SURGEON:  Joni Fears, MD    ASSISTANT:   Biagio Borg, PA-C   (Present and scrubbed throughout the case, critical for assistance with exposure, retraction, instrumentation, and closure.)          ANESTHESIA: general     DRAINS: none :      TOURNIQUET TIME: * No tourniquets in log *    COMPLICATIONS:  None   CONDITION:  stable  PROCEDURE IN [id number]   Vonna Kotyk Ranette Luckadoo 05/20/2013, 12:45 PM

## 2013-05-20 NOTE — Anesthesia Preprocedure Evaluation (Signed)
Anesthesia Evaluation  Patient identified by MRN, date of birth, ID band Patient awake    Reviewed: Allergy & Precautions, H&P , Patient's Chart, lab work & pertinent test results, reviewed documented beta blocker date and time   History of Anesthesia Complications (+) Family history of anesthesia reactionNegative for: history of anesthetic complications  Airway Mallampati: III TM Distance: >3 FB Neck ROM: full    Dental   Pulmonary shortness of breath and with exertion, asthma , COPDformer smoker,  breath sounds clear to auscultation        Cardiovascular Exercise Tolerance: Good Rhythm:regular Rate:Normal     Neuro/Psych PSYCHIATRIC DISORDERS Depression    GI/Hepatic GERD-  Controlled,  Endo/Other  Morbid obesity  Renal/GU      Musculoskeletal   Abdominal   Peds  Hematology  (+) anemia ,   Anesthesia Other Findings   Reproductive/Obstetrics                           Anesthesia Physical Anesthesia Plan  ASA: III  Anesthesia Plan: General ETT   Post-op Pain Management:    Induction:   Airway Management Planned:   Additional Equipment:   Intra-op Plan:   Post-operative Plan:   Informed Consent: I have reviewed the patients History and Physical, chart, labs and discussed the procedure including the risks, benefits and alternatives for the proposed anesthesia with the patient or authorized representative who has indicated his/her understanding and acceptance.   Dental Advisory Given  Plan Discussed with: CRNA and Surgeon  Anesthesia Plan Comments:         Anesthesia Quick Evaluation

## 2013-05-20 NOTE — Anesthesia Postprocedure Evaluation (Signed)
  Anesthesia Post Note  Patient: Bruce [name]  Procedure(s) Performed: Procedure(s) (LRB): REMOVAL OF HARDWARE LEFT FEMUR WITH LOCAL BONE GRAFT (Left)  Anesthesia type: GA  Patient location: PACU  Post pain: Pain level controlled  Post assessment: Post-op Vital signs reviewed  Last Vitals:  Filed Vitals:   05/20/13 1345  BP:   Pulse: 54  Temp:   Resp: 9    Post vital signs: Reviewed  Level of consciousness: sedated  Complications: No apparent anesthesia complications

## 2013-05-20 NOTE — Progress Notes (Signed)
Orthopedic Tech Progress Note Patient Details:  Bruce [name] [birth year] [id number] OHF applied to bed. Crutches delivered and set to approximate height. May need to be adjusted by PT when patient is able to get OOB. Ortho Devices Type of Ortho Device: Crutches Ortho Device/Splint Interventions: Ordered;Application   [place] 05/20/2013, 3:18 PM

## 2013-05-20 NOTE — H&P (Signed)
  The recent History & Physical has been reviewed. I have personally examined the patient today. There is no interval change to the documented History & Physical. The patient would like to proceed with the procedure.  Garald Balding 05/20/2013,  9:53 AM

## 2013-05-20 NOTE — Transfer of Care (Signed)
Immediate Anesthesia Transfer of Care Note  Patient: Bruce [name]  Procedure(s) Performed: Procedure(s): REMOVAL OF HARDWARE LEFT FEMUR WITH LOCAL BONE GRAFT (Left)  Patient Location: PACU  Anesthesia Type:General  Level of Consciousness: awake, alert , oriented, patient cooperative and responds to stimulation  Airway & Oxygen Therapy: Patient Spontanous Breathing and Patient connected to nasal cannula oxygen  Post-op Assessment: Report given to PACU RN, Post -op Vital signs reviewed and stable and Patient moving all extremities X 4  Post vital signs: Reviewed and stable  Complications: No apparent anesthesia complications

## 2013-05-21 ENCOUNTER — Encounter (HOSPITAL_COMMUNITY): Payer: Self-pay | Admitting: General Practice

## 2013-05-21 DIAGNOSIS — T8489XA Other specified complication of internal orthopedic prosthetic devices, implants and grafts, initial encounter: Secondary | ICD-10-CM | POA: Diagnosis not present

## 2013-05-21 DIAGNOSIS — Z969 Presence of functional implant, unspecified: Secondary | ICD-10-CM

## 2013-05-21 LAB — CBC
HCT: 29.8 % — ABNORMAL LOW (ref 39.0–52.0)
Hemoglobin: 9.6 g/dL — ABNORMAL LOW (ref 13.0–17.0)
MCH: 28.4 pg (ref 26.0–34.0)
MCHC: 32.2 g/dL (ref 30.0–36.0)
MCV: 88.2 fL (ref 78.0–100.0)
Platelets: 139 10*3/uL — ABNORMAL LOW (ref 150–400)
RBC: 3.38 MIL/uL — ABNORMAL LOW (ref 4.22–5.81)
RDW: 13.8 % (ref 11.5–15.5)
WBC: 7.2 10*3/uL (ref 4.0–10.5)

## 2013-05-21 LAB — BASIC METABOLIC PANEL
BUN: 15 mg/dL (ref 6–23)
CALCIUM: 7.9 mg/dL — AB (ref 8.4–10.5)
CO2: 27 mEq/L (ref 19–32)
CREATININE: 1.14 mg/dL (ref 0.50–1.35)
Chloride: 100 mEq/L (ref 96–112)
GFR calc non Af Amer: 66 mL/min — ABNORMAL LOW (ref >90)
GFR, EST AFRICAN AMERICAN: 76 mL/min — AB (ref >90)
Glucose, Bld: 153 mg/dL — ABNORMAL HIGH (ref 70–99)
Potassium: 4.1 mEq/L (ref 3.7–5.3)
Sodium: 137 mEq/L (ref 137–147)

## 2013-05-21 MED ORDER — OXYCODONE HCL 5 MG PO TABS: 5.0000 mg | ORAL_TABLET | ORAL | Status: DC | PRN

## 2013-05-21 MED ORDER — ASPIRIN EC 81 MG PO TBEC: 81.0000 mg | DELAYED_RELEASE_TABLET | Freq: Every day | ORAL | Status: DC

## 2013-05-21 NOTE — Op Note (Signed)
NAME:  Bruce [name], Bruce [name] NO.:  [id number]  MEDICAL RECORD NO.:  [id number]  LOCATION:  6N27C                        FACILITY:  [facility]  PHYSICIAN:  Vonna Kotyk. Ashton Belote, M.D.DATE OF BIRTH:  February 17, 1945  DATE OF PROCEDURE:  05/20/2013 DATE OF DISCHARGE:                              OPERATIVE REPORT   PREOPERATIVE DIAGNOSIS:  Painful retained compression screw and long side plate left femur.  POSTOPERATIVE DIAGNOSIS:  Painful retained compression screw and long side plate left femur.  PROCEDURES:  Removal of compression screw alongside plate 8 screws, left femur with bone grafting of compression screw and the cortical screw holes.  SURGEON:  Vonna Kotyk. Durward Fortes, MD  ASSISTANT:  Biagio Borg, PA-C  ANESTHESIA:  General.  COMPLICATIONS:  None.  DESCRIPTION OF PROCEDURE:  Bruce [name] was met in the holding area with his family.  Identified the left hip was appropriate operative site and marked it accordingly he is over 2 years status post intertrochanteric and subtrochanteric fracture of his left femur requiring open reduction and internal fixation with compression screw alongside plate.  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 He also has a fractured 3 of the lower 4 screws with a lift off of the plate causing bursa and pain.  Bruce [name] was then transported to room #7 and placed under general anesthesia without difficulty.  The patient was then placed in the lateral decubitus position with the left side up and secured with a bean bag.  The left lower extremity was then prepped with Betadine scrub and then DuraPrep from iliac crest to just below the knee.  Sterile draping was performed.  The prior lateral femoral incision was partially utilized and began at its proximal extent and extended about 3/4 of the incision.  Via sharp dissection incision down  to subcutaneous tissue.  Bleeders were Bovie coagulated.  The iliotibial band was identified and incised along length of the skin incision.  Self-retaining retractors were inserted.  The vastus lateralis muscle and scar was then carefully incised to expose the lateral femur and the plate.  There was a bursal formation over the plate and screws and this was removed.  There were 2 circumferential wires that had been inserted.  The 1st was removed without difficulty after cutting the screw with a wire cutter. The second one had been imbedded with bone medially, so I removed the visible portion leaving some part of the wire imbedded in bone along the medial aspect of the femur with well out of harm's way.  There were 8 screws, the proximal 4 screws were then removed with the Smith and Nephew hexagonal head screwdriver.  The 5th screw had fractured, the 6 screw was intact, and the 7th and 8 screws had fractured.  So, the head and the portion of the screws were then removed.  At that point, the plate was loose and it was carefully extracted from the wound.  The compression screw was then removed using the extraction device with some difficulty.  At that point, the wound was irrigated with saline solution.  I impacted the compression screw  hole in the femoral neck and head with a cortical cancellous bone chips and I carefully impacted down in stages, had a very nice graft.  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 I used universal screw extraction set to ream a hole along the lateral femoral cortex and grab a hold of the screw with a screw extraction device and then removed and I irrigated the wounds and then packed them with cortical cancellous bone chips.  The wound was then carefully irrigated with saline solution.  He  did receive several grams of Ancef preoperatively.  I had very nice hemostasis.  I closed the wound in several layers.  The vastus lateralis fascia was closed with a running 0 Vicryl.  The deeper adipose and fascia with the same suture of the subcu with 3-0 Monocryl.  Skin was closed with skin clips.  Sterile bulky dressing was applied.  The patient was then awoken and returned to the postanesthesia recovery room in satisfactory condition.  PLAN:  To keep him overnight and discharge in the morning.     Vonna Kotyk. Durward Fortes, M.D.     PWW/MEDQ  D:  05/20/2013  T:  05/21/2013  Job:  [id number]

## 2013-05-21 NOTE — H&P (Signed)
PATIENT BEING DISCHARGED AND UNABLE TO FINISH HISTORY

## 2013-05-21 NOTE — Discharge Summary (Signed)
Joni Fears, MD   Biagio Borg, PA-C [address], [place], [place]  [address]                             [phone]  PATIENT ID: Bruce [name]        MRN:  [id number]          DOB/AGE: March 08, 1945 / 68 y.o.    DISCHARGE SUMMARY  ADMISSION DATE:    05/20/2013 DISCHARGE DATE:   05/21/2013   ADMISSION DIAGNOSIS: painful retained hardware left femur    DISCHARGE DIAGNOSIS:  PAINFUL RETAINED HARDWARE LEFT FEMUR    ADDITIONAL DIAGNOSIS: Principal Problem:   Retained orthopedic hardware Active Problems:   S/P hardware removal  Past Medical History  Diagnosis Date  . Eosinophilia   . Thrombocytopenia   . COPD (chronic obstructive pulmonary disease)   . Obesity   . Degenerative joint disease   . Depression   . Gout   . Anemia 02/02/2011  . Hyperlipidemia   . Hip fracture, left January 2013.    Status post ORIF  . Wrist fracture, left   . Laceration of finger, index     left  . Asthma 12/01/2011    On symbicort 2 puffs BID  . Family history of anesthesia complication     brother had confusion after CABG  . Shortness of breath     with exertion  . Sinus disease   . GERD (gastroesophageal reflux disease)     takes Tums    PROCEDURE: Procedure(s): REMOVAL OF HARDWARE LEFT FEMUR WITH LOCAL BONE GRAFT on 05/20/2013  CONSULTS: none     HISTORY: Have been following Mr. Eberwein for the painful left thigh that appears to be related to a symptomatic compression screw and long side plate. The initial surgery was performed by Dr. 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 It is not going to be that easy because of the broken screws and the wires, but I think that as long as the fracture is healed then removing the plate would alleviate his symptoms. He is really only painful along the very tip of the plate and I suspect he has developed a bursitis. CT scan this week and it looks like the fracture has healed   HOSPITAL COURSE:  GUS LITTLER is a 68 y.o. admitted on 05/20/2013 and found to have a diagnosis of [place].  After appropriate laboratory studies were obtained  they were taken to the operating room on 05/20/2013 and underwent  Procedure(s): REMOVAL OF HARDWARE LEFT FEMUR WITH LOCAL BONE GRAFT  .   They were given perioperative antibiotics:  Anti-infectives   Start     Dose/Rate Route Frequency Ordered Stop   05/20/13 1600  ceFAZolin (ANCEF) IVPB 2 g/50 mL premix     2 g 100 mL/hr over 30 Minutes Intravenous Every 6 hours  05/20/13 1502 05/21/13 0423   05/20/13 0600  ceFAZolin (ANCEF) 3 g in dextrose 5 % 50 mL IVPB     3 g 160 mL/hr over 30 Minutes Intravenous On call to O.R. 05/19/13 1400 05/20/13 1032    .  Tolerated the procedure well.  Toradol was given post op.  POD #1, allowed out of bed to a chair.  PT for ambulation and exercise program. Did well.  Dressing changed.  Wound looked good.  The remainder of the hospital course was dedicated to ambulation and strengthening.   The patient was discharged on 1 Day Post-Op in  Stable condition.  Blood products given:none  DIAGNOSTIC STUDIES: Recent vital signs: Patient Vitals for the past 24 hrs:  BP Temp Temp src Pulse Resp SpO2 Height Weight  05/21/13 0508 114/48 mmHg 97.4 F (36.3 C) Oral 60 18 96 % - -  05/21/13 0144 112/58 mmHg 97.5 F (36.4 C) Oral 60 18 98 % - -  05/20/13 2047 125/64 mmHg 97.4 F (36.3 C) Oral 71 18 98 % - -  05/20/13 1847 123/52 mmHg - - 59 18  98 % - -  05/20/13 1457 115/61 mmHg 97.3 F (36.3 C) Oral 52 16 99 % - -  05/20/13 1415 - 97.3 F (36.3 C) - 56 18 96 % - -  05/20/13 1405 - - - 51 14 100 % - -  05/20/13 1400 - - - 51 20 99 % - -  05/20/13 1345 - - - 54 9 99 % - -  05/20/13 1330 - - - 58 15 100 % - -  05/20/13 1315 - - - 68 17 100 % - -  05/20/13 1307 - 97.8 F (36.6 C) - 74 12 100 % - -  05/20/13 0845 162/56 mmHg 97.6 F (36.4 C) Oral 52 - 98 % 5' 7.5" (1.715 m) 124.399 kg (274 lb 4 oz)       Recent laboratory studies:  Recent Labs  05/21/13 0349  WBC 7.2  HGB 9.6*  HCT 29.8*  PLT 139*    Recent Labs  05/21/13 0349  NA 137  K 4.1  CL 100  CO2 27  BUN 15  CREATININE 1.14  GLUCOSE 153*  CALCIUM 7.9*   Lab Results  Component Value Date   INR 1.00 05/12/2013   INR 1.23 02/01/2011     Recent Radiographic Studies :  Chest 2 View  05/12/2013   CLINICAL DATA:  Pre-surgical chest radiograph.  COPD and asthma.  EXAM: CHEST  2 VIEW  COMPARISON:  04/2013  FINDINGS: The heart size and mediastinal contours are within normal limits. Both lungs are clear. The bony thorax is intact.  IMPRESSION: No active cardiopulmonary disease.   Electronically Signed   By: Lajean Manes M.D.   On: 05/12/2013 09:51    DISCHARGE INSTRUCTIONS: Discharge Orders   Future Orders Complete By Expires   Call MD / Call 911  As directed    Change dressing  As directed    Constipation Prevention  As directed    Diet general  As directed    Discharge instructions  As directed    Driving restrictions  As directed    Increase activity slowly as tolerated  As directed    Lifting restrictions  As directed    Patient may shower  As directed    TED hose  As directed    Weight bearing as tolerated  As directed    Scheduling Instructions:  With crutches or walker   Questions:     Laterality:  left   Extremity:  Upper      DISCHARGE MEDICATIONS:     Medication List    STOP taking these medications        oxyCODONE-acetaminophen 5-325 MG per tablet  Commonly known as:  PERCOCET/ROXICET      TAKE these medications       aspirin EC 81 MG tablet  Take 1 tablet (81 mg total) by mouth daily.     atorvastatin 10 MG tablet  Commonly known as:  LIPITOR  Take 10 mg by mouth at bedtime.     budesonide-formoterol 80-4.5 MCG/ACT inhaler  Commonly known as:  SYMBICORT  Inhale 2 puffs into the lungs 2 (two) times daily.     citalopram 20 MG tablet  Commonly known as:  CELEXA  Take 20 mg by mouth daily.     fish oil-omega-3 fatty acids 1000 MG capsule  Take 1,000 mg by mouth at bedtime.     methocarbamol 500 MG tablet  Commonly known as:  ROBAXIN  Take 500 mg by mouth every 8 (eight) hours as needed for muscle spasms.     naproxen 500 MG tablet  Commonly known as:  NAPROSYN  Take 500 mg by mouth 2 (two) times daily with a meal.     oxyCODONE 5 MG immediate release tablet  Commonly known as:  Oxy IR/ROXICODONE  Take 1-2 tablets (5-10 mg total) by mouth every 4 (four) hours as needed for breakthrough pain.        FOLLOW UP VISIT:       Follow-up Information   Follow up with [facility], Vonna Kotyk, MD. Schedule an appointment as soon as possible for a visit on 05/28/2013.   Specialty:  Orthopedic Surgery   Contact information:   [address] [place] [address] [phone]       DISPOSITION:   Home  CONDITION:  Stable   Biagio Borg 05/21/2013, 8:13 AM

## 2013-05-21 NOTE — Evaluation (Signed)
Physical Therapy Evaluation Patient Details Name: Bruce [name] MRN: [id number] DOB: [birth year] Today's Date: 05/21/2013   History of Present Illness  pt is a 68 y.o. s/p hardware removal of Lt femur.   Clinical Impression  Pt adm due to the surgery above. Pt ambulating at supervision level for safety and min cues for technique with crutches at this time. Reviewed stair management technique with pt and wife; pt felt confident in ability to perform one step to enter den. Pt safe from mobility standpoint to D/C home with wife today. No further acute PT needs warranted at this time.     Follow Up Recommendations Outpatient PT;Supervision for mobility/OOB    Equipment Recommendations  None recommended by PT    Recommendations for Other Services       Precautions / Restrictions Precautions Precautions: None Restrictions Weight Bearing Restrictions: Yes LLE Weight Bearing: Weight bearing as tolerated      Mobility  Bed Mobility Overal bed mobility: Modified Independent             General bed mobility comments: HOB flattened to simulate home; incr time required   Transfers Overall transfer level: Needs assistance Equipment used: Crutches Transfers: Sit to/from Stand Sit to Stand: Supervision         General transfer comment: supervision for safety and min cues for technique with cruthces   Ambulation/Gait Ambulation/Gait assistance: Supervision Ambulation Distance (Feet): 200 Feet Assistive device: Crutches Gait Pattern/deviations: Step-through pattern;Decreased stride length;Trunk flexed;Wide base of support Gait velocity: decreased Gait velocity interpretation: Below normal speed for age/gender General Gait Details: supervision for safety; no LOB noted; min cues for tehcnique with crutches   Stairs Stairs:  (reviewed proper technique )          Wheelchair Mobility    Modified Rankin (Stroke Patients Only)       Balance Overall balance assessment:  Needs assistance Sitting-balance support: Feet supported;No upper extremity supported Sitting balance-Leahy Scale: Normal     Standing balance support: During functional activity;Bilateral upper extremity supported Standing balance-Leahy Scale: Poor Standing balance comment: requires bil UES to be supported                              Pertinent Vitals/Pain "just sore"  Did not rate pain     Home Living Family/patient expects to be discharged to:: Private residence Living Arrangements: Spouse/significant other Available Help at Discharge: Family;Available 24 hours/day Type of Home: House Home Access: Stairs to enter (to enter living room ) Entrance Stairs-Rails: None Entrance Stairs-Number of Steps: 1 for main room Home Layout: One level Home Equipment: Walker - 2 wheels;Crutches;Bedside commode      Prior Function Level of Independence: Independent               Hand Dominance   Dominant Hand: Right    Extremity/Trunk Assessment   Upper Extremity Assessment: Overall WFL for tasks assessed           Lower Extremity Assessment: LLE deficits/detail   LLE Deficits / Details: grossly 3+/5 in quad   Cervical / Trunk Assessment: Normal  Communication   Communication: No difficulties  Cognition Arousal/Alertness: Awake/alert Behavior During Therapy: WFL for tasks assessed/performed Overall Cognitive Status: Within Functional Limits for tasks assessed                      General Comments      Exercises  Assessment/Plan    PT Assessment Patent does not need any further PT services;All further PT needs can be met in the next venue of care  PT Diagnosis Difficulty walking;Acute pain   PT Problem List Decreased strength;Decreased activity tolerance;Decreased balance;Decreased mobility;Pain  PT Treatment Interventions     PT Goals (Current goals can be found in the Care Plan section) Acute Rehab PT Goals Patient Stated Goal:  home today PT Goal Formulation: No goals set, d/c therapy    Frequency     Barriers to discharge        Co-evaluation               End of Session Equipment Utilized During Treatment: Gait belt Activity Tolerance: Patient tolerated treatment well Patient left: in bed;with call bell/phone within reach;with family/visitor present Nurse Communication: Mobility status    Functional Assessment Tool Used: clinical judgement  Functional Limitation: Mobility: Walking and moving around Mobility: Walking and Moving Around Current Status (G8978): At least 1 percent but less than 20 percent impaired, limited or restricted Mobility: Walking and Moving Around Goal Status (G8979): At least 1 percent but less than 20 percent impaired, limited or restricted Mobility: Walking and Moving Around Discharge Status (G8980): At least 1 percent but less than 20 percent impaired, limited or restricted    Time: 0913-0927 PT Time Calculation (min): 14 min   Charges:   PT Evaluation $Initial PT Evaluation Tier I: 1 Procedure PT Treatments $Gait Training: 8-22 mins   PT G Codes:   Functional Assessment Tool Used: clinical judgement  Functional Limitation: Mobility: Walking and moving around    Brittany N West, PT  [phone] 05/21/2013, 9:53 AM   

## 2013-05-21 NOTE — Progress Notes (Signed)
AVS discharge instructions were given and went over with patient and his family. Patient was given prescriptions for oxycodone and aspirin to take to pharmacy. Prescription for oxycodone was not signed so patient stated that it was  fine for him to go to [facility] on [address]. about two blocks from [employer] for [employer] to sign. Pt stated he did not have any questions. Volunteers were called to assist patient to his transportation.

## 2013-05-21 NOTE — Progress Notes (Signed)
PATIENT ID: BLISS TSANG        MRN:  [id number]          DOB/AGE: 1945/11/18 / 68 y.o.    Joni Fears, MD   Biagio Borg, PA-C [address] [place], [place]  [address]                             [phone]   PROGRESS NOTE  Subjective:  negative for Chest Pain  negative for Shortness of Breath  negative for Nausea/Vomiting   negative for Calf Pain    Tolerating Diet: yes         Patient reports pain as mild.     Doing well, no major complaints  Objective: Vital signs in last 24 hours:   Patient Vitals for the past 24 hrs:  BP Temp Temp src Pulse Resp SpO2 Height Weight  05/21/13 0508 114/48 mmHg 97.4 F (36.3 C) Oral 60 18 96 % - -  05/21/13 0144 112/58 mmHg 97.5 F (36.4 C) Oral 60 18 98 % - -  05/20/13 2047 125/64 mmHg 97.4 F (36.3 C) Oral 71 18 98 % - -  05/20/13 1847 123/52 mmHg - - 59 18 98 % - -  05/20/13 1457 115/61 mmHg 97.3 F (36.3 C) Oral 52 16 99 % - -  05/20/13 1415 - 97.3 F (36.3 C) - 56 18 96 % - -  05/20/13 1405 - - - 51 14 100 % - -  05/20/13 1400 - - - 51 20 99 % - -  05/20/13 1345 - - - 54 9 99 % - -  05/20/13 1330 - - - 58 15 100 % - -  05/20/13 1315 - - - 68 17 100 % - -  05/20/13 1307 - 97.8 F (36.6 C) - 74 12 100 % - -  05/20/13 0845 162/56 mmHg 97.6 F (36.4 C) Oral 52 - 98 % 5' 7.5" (1.715 m) 124.399 kg (274 lb 4 oz)      Intake/Output from previous day:   05/05 0701 - 05/06 0700 In: 3300 [P.O.:1400; I.V.:1900] Out: 150    Intake/Output this shift:       Intake/Output     05/05 0701 - 05/06 0700 05/06 0701 - 05/07 0700   P.O. 1400    I.V. (mL/kg) 1900 (15.3)    Total Intake(mL/kg) 3300 (26.5)    Blood 150    Total Output 150     Net +3150             LABORATORY DATA:  Recent Labs  05/21/13 0349  WBC 7.2  HGB 9.6*  HCT 29.8*  PLT 139*    Recent Labs  05/21/13 0349  NA 137  K 4.1  CL 100  CO2 27  BUN 15  CREATININE 1.14  GLUCOSE 153*  CALCIUM 7.9*   Lab Results  Component Value Date   INR 1.00 05/12/2013   INR 1.23 02/01/2011    Recent Radiographic Studies :  Chest 2 View  05/12/2013   CLINICAL DATA:  Pre-surgical chest radiograph.  COPD and asthma.  EXAM: CHEST  2 VIEW  COMPARISON:  04/2013  FINDINGS: The heart size and mediastinal contours are within normal limits. Both lungs are clear. The bony thorax is intact.  IMPRESSION: No active cardiopulmonary disease.   Electronically Signed   By: Lajean Manes M.D.   On: 05/12/2013 09:51     Examination:  General appearance: alert, cooperative and mild distress  Wound Exam: clean, dry, intact   Drainage:  Scant/small amount Bloody exudate  Motor Exam: EHL, FHL, Anterior Tibial and Posterior Tibial Intact  Sensory Exam: Superficial Peroneal, Deep Peroneal and Tibial normal  Vascular Exam: Left dorsalis pedis artery has trace pulse  Assessment:    1 Day Post-Op  Procedure(s) (LRB): REMOVAL OF HARDWARE LEFT FEMUR WITH LOCAL BONE GRAFT (Left)  ADDITIONAL DIAGNOSIS:  Principal Problem:   Retained orthopedic hardware Active Problems:   S/P hardware removal  Acute Blood Loss Anemia asymptomatic   Plan: Physical Therapy as ordered Weight Bearing as Tolerated (WBAT)  DVT Prophylaxis:  Aspirin at home  DISCHARGE PLAN: Home today  DISCHARGE NEEDS: crutches         Biagio Borg 05/21/2013 8:16 AM

## 2013-05-23 ENCOUNTER — Encounter (HOSPITAL_COMMUNITY): Payer: Self-pay | Admitting: Orthopaedic Surgery

## 2013-07-02 DIAGNOSIS — M25559 Pain in unspecified hip: Secondary | ICD-10-CM | POA: Diagnosis not present

## 2013-08-08 ENCOUNTER — Other Ambulatory Visit (HOSPITAL_COMMUNITY): Payer: Self-pay | Admitting: Hematology and Oncology

## 2013-10-21 DIAGNOSIS — Z23 Encounter for immunization: Secondary | ICD-10-CM | POA: Diagnosis not present

## 2013-10-27 DIAGNOSIS — Z125 Encounter for screening for malignant neoplasm of prostate: Secondary | ICD-10-CM | POA: Diagnosis not present

## 2013-10-27 DIAGNOSIS — F322 Major depressive disorder, single episode, severe without psychotic features: Secondary | ICD-10-CM | POA: Diagnosis not present

## 2013-10-27 DIAGNOSIS — J45909 Unspecified asthma, uncomplicated: Secondary | ICD-10-CM | POA: Diagnosis not present

## 2013-10-27 DIAGNOSIS — E785 Hyperlipidemia, unspecified: Secondary | ICD-10-CM | POA: Diagnosis not present

## 2013-11-05 DIAGNOSIS — D649 Anemia, unspecified: Secondary | ICD-10-CM | POA: Diagnosis not present

## 2013-11-20 DIAGNOSIS — D649 Anemia, unspecified: Secondary | ICD-10-CM | POA: Diagnosis not present

## 2013-12-01 ENCOUNTER — Encounter: Payer: Self-pay | Admitting: Gastroenterology

## 2013-12-29 DIAGNOSIS — D649 Anemia, unspecified: Secondary | ICD-10-CM | POA: Diagnosis not present

## 2013-12-29 DIAGNOSIS — E785 Hyperlipidemia, unspecified: Secondary | ICD-10-CM | POA: Diagnosis not present

## 2013-12-29 DIAGNOSIS — L309 Dermatitis, unspecified: Secondary | ICD-10-CM | POA: Diagnosis not present

## 2013-12-29 DIAGNOSIS — J4520 Mild intermittent asthma, uncomplicated: Secondary | ICD-10-CM | POA: Diagnosis not present

## 2013-12-30 ENCOUNTER — Other Ambulatory Visit (HOSPITAL_COMMUNITY): Payer: Self-pay | Admitting: Respiratory Therapy

## 2013-12-30 DIAGNOSIS — R0602 Shortness of breath: Secondary | ICD-10-CM | POA: Diagnosis not present

## 2013-12-30 DIAGNOSIS — I10 Essential (primary) hypertension: Secondary | ICD-10-CM | POA: Diagnosis not present

## 2014-01-05 ENCOUNTER — Encounter: Payer: Self-pay | Admitting: Gastroenterology

## 2014-01-05 ENCOUNTER — Other Ambulatory Visit: Payer: Self-pay

## 2014-01-05 ENCOUNTER — Ambulatory Visit (HOSPITAL_COMMUNITY)
Admission: RE | Admit: 2014-01-05 | Discharge: 2014-01-05 | Disposition: A | Discharge status: Home / Self Care | Payer: Medicare Other | Source: Ambulatory Visit | Attending: Pulmonary Disease | Admitting: Pulmonary Disease

## 2014-01-05 ENCOUNTER — Ambulatory Visit (INDEPENDENT_AMBULATORY_CARE_PROVIDER_SITE_OTHER): Payer: Medicare Other | Admitting: Gastroenterology

## 2014-01-05 ENCOUNTER — Other Ambulatory Visit (HOSPITAL_COMMUNITY): Payer: Self-pay | Admitting: Pulmonary Disease

## 2014-01-05 VITALS — BP 153/67 | HR 52 | Temp 97.0°F | Ht 67.5 in | Wt 272.2 lb

## 2014-01-05 DIAGNOSIS — R195 Other fecal abnormalities: Secondary | ICD-10-CM | POA: Diagnosis not present

## 2014-01-05 DIAGNOSIS — R0602 Shortness of breath: Secondary | ICD-10-CM

## 2014-01-05 DIAGNOSIS — D649 Anemia, unspecified: Secondary | ICD-10-CM | POA: Diagnosis not present

## 2014-01-05 MED ORDER — ALBUTEROL SULFATE (2.5 MG/3ML) 0.083% IN NEBU
2.5000 mg | INHALATION_SOLUTION | Freq: Once | RESPIRATORY_TRACT | Status: AC
  Administered 2014-01-05: 2.5 mg via RESPIRATORY_TRACT

## 2014-01-05 MED ORDER — PEG-KCL-NACL-NASULF-NA ASC-C 100 G PO SOLR: 1.0000 | ORAL | Status: DC

## 2014-01-05 NOTE — Progress Notes (Signed)
Primary Care Physician:  Sherrie Mustache, MD Primary Gastroenterologist:  Dr. Oneida Alar   Chief Complaint  Patient presents with  . Blood In Stools    occult stool postitve    HPI:   Bruce [name] is a 68 y.o. male presenting  today at the request of his PCP secondary to heme positive stool. Last colonoscopy in Feb 2011 by Dr. Oneida Alar with  small internal hemorrhoids, frequent diverticula, benign polypoid tissue and hyperplastic polyps.   Notes occasional fecal leakage, chronic, worse with lifting. BM usually daily. Denies constipation. About once a week takes a stool softener. Usually skid marks in underwear. Eating fiber-rich foods. No upper GI symptoms. Reports he was told he was anemic, then labs rechecked and he "wasn't". No overt signs of GI bleeding.   Past Medical History  Diagnosis Date  . Eosinophilia   . Thrombocytopenia   . COPD (chronic obstructive pulmonary disease)   . Obesity   . Degenerative joint disease   . Depression   . Gout   . Anemia 02/02/2011  . Hyperlipidemia   . Hip fracture, left January 2013.    Status post ORIF  . Wrist fracture, left   . Laceration of finger, index     left  . Asthma 12/01/2011    On symbicort 2 puffs BID  . Family history of anesthesia complication     brother had confusion after CABG  . Shortness of breath     with exertion  . Sinus disease   . GERD (gastroesophageal reflux disease)     takes Tums    Past Surgical History  Procedure Laterality Date  . Multiple tooth extractions    . Left hip orif  January 2013.    Dr. Luna Glasgow.  . Compression hip screw  02/01/2011    Procedure: COMPRESSION HIP;  Surgeon: Sanjuana Kava, MD;  Location: [facility];  Service: Orthopedics;  Laterality: Left;  . Orif femur fracture  02/01/2011    Procedure: OPEN REDUCTION INTERNAL FIXATION (ORIF) DISTAL FEMUR FRACTURE;  Surgeon: Sanjuana Kava, MD;  Location: [facility];  Service: Orthopedics;  Laterality: Left;  . Colonoscopy w/ biopsies and  polypectomy  Feb 2011    Dr. Oneida Alar: small internal hemorrhoids, frequent diverticula, benign polypoid tissue and hyperplastic polyps   . Hardware removal Left 05/20/2013    Procedure: REMOVAL OF HARDWARE LEFT FEMUR WITH LOCAL BONE GRAFT;  Surgeon: Garald Balding, MD;  Location: [facility];  Service: Orthopedics;  Laterality: Left;    Current Outpatient Prescriptions  Medication Sig Dispense Refill  . aspirin EC 81 MG tablet Take 1 tablet (81 mg total) by mouth daily. 30 tablet 0  . atorvastatin (LIPITOR) 10 MG tablet Take 10 mg by mouth at bedtime.     . budesonide-formoterol (SYMBICORT) 80-4.5 MCG/ACT inhaler Inhale 2 puffs into the lungs 2 (two) times daily.      . citalopram (CELEXA) 20 MG tablet Take 20 mg by mouth daily.    . fish oil-omega-3 fatty acids 1000 MG capsule Take 1,000 mg by mouth at bedtime.      No current facility-administered medications for this visit.    Allergies as of 01/05/2014 - Review Complete 01/05/2014  Allergen Reaction Noted  . Morphine and related Itching and Nausea Only 05/06/2013    Family History  Problem Relation Age of Onset  . Cancer Mother     leukemia  . Emphysema Father   . Cancer Sister   . Cancer Brother   .  Colon cancer Brother     diagnosed at age 63, deceased age 38.     History   Social History  . Marital Status: Married    Spouse Name: N/A    Number of Children: N/A  . Years of Education: N/A   Occupational History  . Not on file.   Social History Main Topics  . Smoking status: Former Smoker -- 4.00 packs/day for 20 years    Types: Cigarettes    Quit date: 05/13/1983  . Smokeless tobacco: Current User    Types: Chew  . Alcohol Use: 0.0 oz/week    0 Not specified per week     Comment: at times rarely  . Drug Use: No  . Sexual Activity: Not Currently   Other Topics Concern  . Not on file   Social History Narrative    Review of Systems: Gen: Denies any fever, chills, fatigue, weight loss, lack of appetite.  CV:  Denies chest pain, heart palpitations, peripheral edema, syncope.  Resp: +DOE GI: see HPI GU : Denies urinary burning, urinary frequency, urinary hesitancy MS: arthritis Derm: Denies rash, itching, dry skin Psych: Denies depression, anxiety, memory loss, and confusion Heme: Denies bruising, bleeding, and enlarged lymph nodes.  Physical Exam: BP 153/67 mmHg  Pulse 52  Temp(Src) 97 F (36.1 C)  Ht 5' 7.5" (1.715 m)  Wt 272 lb 3.2 oz (123.469 kg)  BMI 41.98 kg/m2 General:   Alert and oriented. Pleasant and cooperative. Well-nourished and well-developed.  Head:  Normocephalic and atraumatic. Eyes:  Without icterus, sclera clear and conjunctiva pink.  Ears:  Normal auditory acuity. Nose:  No deformity, discharge,  or lesions. Mouth:  No deformity or lesions, oral mucosa pink.  Lungs:  Clear to auscultation bilaterally. No wheezes, rales, or rhonchi. No distress.  Heart:  S1, S2 present without murmurs appreciated.  Abdomen:  +BS, soft, non-tender and non-distended. No HSM noted. No guarding or rebound. No masses appreciated.  Rectal:  Deferred  Msk:  Symmetrical without gross deformities. Normal posture. Extremities:  Without  edema. Neurologic:  Alert and  oriented x4;  grossly normal neurologically. Skin:  Intact without significant lesions or rashes. Psych:  Alert and cooperative. Normal mood and affect.   Outside labs Dec 2015:  Hgb 12.8, Hct 40.1, Plts 205, iron 40, TIBC 345

## 2014-01-05 NOTE — Assessment & Plan Note (Signed)
Recheck CBC and ferritin now.

## 2014-01-05 NOTE — Assessment & Plan Note (Addendum)
68 year old male with heme positive stool but without overt signs of GI bleeding and possible recent anemia noted on CBC; however, outside labs reviewed with low normal Hgb, low normal iron, normal TIBC. Will update CBC and add ferritin. Likely benign anorectal source for heme positive stool; however, his brother was diagnosed with colon cancer in his 19s and succumbed to the disease.   Proceed with colonoscopy +/- EGD (if evidence of evolving IDA) with Dr. Oneida Alar in the near future. The risks, benefits, and alternatives have been discussed in detail with the patient. They state understanding and desire to proceed.  CBC and ferritin now

## 2014-01-05 NOTE — Patient Instructions (Signed)
Please complete blood work today. We will call with results.  We have scheduled you for a colonoscopy with possible upper endoscopy with Dr. Oneida Alar.

## 2014-01-06 LAB — PULMONARY FUNCTION TEST
DL/VA % PRED: 81 %
DL/VA: 3.61 ml/min/mmHg/L
DLCO UNC % PRED: 65 %
DLCO UNC: 19.02 ml/min/mmHg
DLCO cor % pred: 65 %
DLCO cor: 19.02 ml/min/mmHg
FEF 25-75 POST: 1.21 L/s
FEF 25-75 Pre: 1.05 L/sec
FEF2575-%Change-Post: 14 %
FEF2575-%PRED-POST: 49 %
FEF2575-%PRED-PRE: 43 %
FEV1-%Change-Post: 2 %
FEV1-%Pred-Post: 71 %
FEV1-%Pred-Pre: 69 %
FEV1-POST: 2.2 L
FEV1-PRE: 2.15 L
FEV1FVC-%Change-Post: 2 %
FEV1FVC-%PRED-PRE: 79 %
FEV6-%CHANGE-POST: 1 %
FEV6-%PRED-PRE: 89 %
FEV6-%Pred-Post: 91 %
FEV6-POST: 3.57 L
FEV6-Pre: 3.51 L
FEV6FVC-%CHANGE-POST: 1 %
FEV6FVC-%Pred-Post: 103 %
FEV6FVC-%Pred-Pre: 102 %
FVC-%Change-Post: 0 %
FVC-%Pred-Post: 88 %
FVC-%Pred-Pre: 87 %
FVC-PRE: 3.65 L
FVC-Post: 3.67 L
PRE FEV1/FVC RATIO: 59 %
Post FEV1/FVC ratio: 60 %
Post FEV6/FVC ratio: 97 %
Pre FEV6/FVC Ratio: 96 %
RV % pred: 177 %
RV: 3.97 L
TLC % pred: 110 %
TLC: 7.18 L

## 2014-01-06 LAB — CBC
HCT: 36.9 % — ABNORMAL LOW (ref 39.0–52.0)
Hemoglobin: 12.4 g/dL — ABNORMAL LOW (ref 13.0–17.0)
MCH: 28.6 pg (ref 26.0–34.0)
MCHC: 33.6 g/dL (ref 30.0–36.0)
MCV: 85 fL (ref 78.0–100.0)
MPV: 9.7 fL (ref 9.4–12.4)
PLATELETS: 201 10*3/uL (ref 150–400)
RBC: 4.34 MIL/uL (ref 4.22–5.81)
RDW: 13.7 % (ref 11.5–15.5)
WBC: 7.5 10*3/uL (ref 4.0–10.5)

## 2014-01-06 LAB — FERRITIN: Ferritin: 24 ng/mL (ref 22–322)

## 2014-01-07 NOTE — Progress Notes (Signed)
Quick Note:  Mild anemia, normocytic, with Hgb 12.4. Ferritin low normal at 24. Proceed with TCS/EGD with Dr. Oneida Alar. ______

## 2014-01-07 NOTE — Progress Notes (Signed)
Quick Note:  Called and informed pt's wife. ______

## 2014-01-14 NOTE — Progress Notes (Signed)
cc'ed to pcp °

## 2014-01-23 ENCOUNTER — Ambulatory Visit (HOSPITAL_COMMUNITY)
Admission: RE | Admit: 2014-01-23 | Discharge: 2014-01-23 | Disposition: A | Discharge status: Home / Self Care | Payer: Medicare Other | Source: Ambulatory Visit | Attending: Gastroenterology | Admitting: Gastroenterology

## 2014-01-23 ENCOUNTER — Encounter (HOSPITAL_COMMUNITY): Payer: Self-pay | Admitting: *Deleted

## 2014-01-23 ENCOUNTER — Encounter (HOSPITAL_COMMUNITY)
Admission: RE | Disposition: A | Discharge status: Home / Self Care | Payer: Self-pay | Source: Ambulatory Visit | Attending: Gastroenterology

## 2014-01-23 DIAGNOSIS — B9681 Helicobacter pylori [H. pylori] as the cause of diseases classified elsewhere: Secondary | ICD-10-CM | POA: Diagnosis not present

## 2014-01-23 DIAGNOSIS — K2950 Unspecified chronic gastritis without bleeding: Secondary | ICD-10-CM | POA: Insufficient documentation

## 2014-01-23 DIAGNOSIS — Z888 Allergy status to other drugs, medicaments and biological substances status: Secondary | ICD-10-CM | POA: Insufficient documentation

## 2014-01-23 DIAGNOSIS — R195 Other fecal abnormalities: Secondary | ICD-10-CM

## 2014-01-23 DIAGNOSIS — E669 Obesity, unspecified: Secondary | ICD-10-CM | POA: Insufficient documentation

## 2014-01-23 DIAGNOSIS — J449 Chronic obstructive pulmonary disease, unspecified: Secondary | ICD-10-CM | POA: Diagnosis not present

## 2014-01-23 DIAGNOSIS — Z951 Presence of aortocoronary bypass graft: Secondary | ICD-10-CM | POA: Insufficient documentation

## 2014-01-23 DIAGNOSIS — Z6841 Body Mass Index (BMI) 40.0 and over, adult: Secondary | ICD-10-CM | POA: Insufficient documentation

## 2014-01-23 DIAGNOSIS — F329 Major depressive disorder, single episode, unspecified: Secondary | ICD-10-CM | POA: Diagnosis not present

## 2014-01-23 DIAGNOSIS — Z800 Family history of malignant neoplasm of digestive organs: Secondary | ICD-10-CM | POA: Insufficient documentation

## 2014-01-23 DIAGNOSIS — K621 Rectal polyp: Secondary | ICD-10-CM | POA: Diagnosis not present

## 2014-01-23 DIAGNOSIS — Z806 Family history of leukemia: Secondary | ICD-10-CM | POA: Diagnosis not present

## 2014-01-23 DIAGNOSIS — J45909 Unspecified asthma, uncomplicated: Secondary | ICD-10-CM | POA: Insufficient documentation

## 2014-01-23 DIAGNOSIS — K635 Polyp of colon: Secondary | ICD-10-CM | POA: Diagnosis not present

## 2014-01-23 DIAGNOSIS — D649 Anemia, unspecified: Secondary | ICD-10-CM | POA: Insufficient documentation

## 2014-01-23 DIAGNOSIS — M109 Gout, unspecified: Secondary | ICD-10-CM | POA: Insufficient documentation

## 2014-01-23 DIAGNOSIS — K5730 Diverticulosis of large intestine without perforation or abscess without bleeding: Secondary | ICD-10-CM | POA: Insufficient documentation

## 2014-01-23 DIAGNOSIS — M1990 Unspecified osteoarthritis, unspecified site: Secondary | ICD-10-CM | POA: Insufficient documentation

## 2014-01-23 DIAGNOSIS — K219 Gastro-esophageal reflux disease without esophagitis: Secondary | ICD-10-CM | POA: Insufficient documentation

## 2014-01-23 DIAGNOSIS — Z836 Family history of other diseases of the respiratory system: Secondary | ICD-10-CM | POA: Diagnosis not present

## 2014-01-23 DIAGNOSIS — D123 Benign neoplasm of transverse colon: Secondary | ICD-10-CM | POA: Insufficient documentation

## 2014-01-23 DIAGNOSIS — D132 Benign neoplasm of duodenum: Secondary | ICD-10-CM | POA: Diagnosis not present

## 2014-01-23 DIAGNOSIS — E785 Hyperlipidemia, unspecified: Secondary | ICD-10-CM | POA: Diagnosis not present

## 2014-01-23 DIAGNOSIS — Z87891 Personal history of nicotine dependence: Secondary | ICD-10-CM | POA: Insufficient documentation

## 2014-01-23 HISTORY — PX: COLONOSCOPY: SHX5424

## 2014-01-23 HISTORY — PX: ESOPHAGOGASTRODUODENOSCOPY: SHX5428

## 2014-01-23 SURGERY — COLONOSCOPY
Anesthesia: Moderate Sedation

## 2014-01-23 MED ORDER — PROMETHAZINE HCL 25 MG/ML IJ SOLN
INTRAMUSCULAR | Status: DC | PRN
Start: 2014-01-23 — End: 2014-01-23
  Administered 2014-01-23: 12.5 mg via INTRAVENOUS

## 2014-01-23 MED ORDER — MEPERIDINE HCL 100 MG/ML IJ SOLN
INTRAMUSCULAR | Status: AC
  Filled 2014-01-23: qty 2

## 2014-01-23 MED ORDER — MIDAZOLAM HCL 5 MG/5ML IJ SOLN
INTRAMUSCULAR | Status: AC
  Filled 2014-01-23: qty 10

## 2014-01-23 MED ORDER — MIDAZOLAM HCL 5 MG/5ML IJ SOLN
INTRAMUSCULAR | Status: DC | PRN
  Administered 2014-01-23: 2 mg via INTRAVENOUS
  Administered 2014-01-23: 1 mg via INTRAVENOUS
  Administered 2014-01-23: 2 mg via INTRAVENOUS
  Administered 2014-01-23: 1 mg via INTRAVENOUS

## 2014-01-23 MED ORDER — LIDOCAINE VISCOUS 2 % MT SOLN
OROMUCOSAL | Status: AC
  Filled 2014-01-23: qty 15

## 2014-01-23 MED ORDER — MEPERIDINE HCL 100 MG/ML IJ SOLN
INTRAMUSCULAR | Status: DC | PRN
  Administered 2014-01-23 (×3): 25 mg

## 2014-01-23 MED ORDER — SODIUM CHLORIDE 0.9 % IV SOLN
INTRAVENOUS | Status: DC
  Administered 2014-01-23: 12:00:00 via INTRAVENOUS

## 2014-01-23 MED ORDER — PROMETHAZINE HCL 25 MG/ML IJ SOLN
INTRAMUSCULAR | Status: AC
  Filled 2014-01-23: qty 1

## 2014-01-23 MED ORDER — STERILE WATER FOR IRRIGATION IR SOLN
Status: DC | PRN
  Administered 2014-01-23: 12:00:00

## 2014-01-23 MED ORDER — LIDOCAINE VISCOUS 2 % MT SOLN
OROMUCOSAL | Status: DC | PRN
  Administered 2014-01-23: 4 mL via OROMUCOSAL

## 2014-01-23 MED ORDER — SODIUM CHLORIDE 0.9 % IJ SOLN
INTRAMUSCULAR | Status: AC
  Filled 2014-01-23: qty 3

## 2014-01-23 NOTE — Discharge Instructions (Signed)
NO SOURCE FOR YOUR LOW BLOOD COUNT WAS IDENTIFIED. YOU HAD ONE SMALL POLYP REMOVED, DIVERTICULOSIS IN THE RECTUM AND COLON, AND SMALL INTERNAL HEMORRHOIDS. You have A GASTRITIS. I biopsied your stomach AND SMALL BOWEL.    EAT FOODS THAT CONTAINS IRON. SEE INFO BELOW.  FOLLOW A HIGH FIBER/LOW FAT DIET. AVOID ITEMS THAT CAUSE BLOATING. SEE INFO BELOW.  YOUR BIOPSY WILL BE BACK IN 14 DAYS OR YOU CAN LOOK THEM UP ON MY CHART AFTER JAN 13.  FOLLOW UP IN 4 MOS.  NEXT COLONOSCOPY IN 10 YEARS IF THE BENEFITS OUTWEIGH THE RISKS.Marland Kitchen   ENDOSCOPY Care After Read the instructions outlined below and refer to this sheet in the next week. These discharge instructions provide you with general information on caring for yourself after you leave the hospital. While your treatment has been planned according to the most current medical practices available, unavoidable complications occasionally occur. If you have any problems or questions after discharge, call DR. Shavy Beachem, [phone].  ACTIVITY  You may resume your regular activity, but move at a slower pace for the next 24 hours.   Take frequent rest periods for the next 24 hours.   Walking will help get rid of the air and reduce the bloated feeling in your belly (abdomen).   No driving for 24 hours (because of the medicine (anesthesia) used during the test).   You may shower.   Do not sign any important legal documents or operate any machinery for 24 hours (because of the anesthesia used during the test).    NUTRITION  Drink plenty of fluids.   You may resume your normal diet as instructed by your doctor.   Begin with a light meal and progress to your normal diet. Heavy or fried foods are harder to digest and may make you feel sick to your stomach (nauseated).   Avoid alcoholic beverages for 24 hours or as instructed.    MEDICATIONS  You may resume your normal medications.   WHAT YOU CAN EXPECT TODAY  Some feelings of bloating in the  abdomen.   Passage of more gas than usual.   Spotting of blood in your stool or on the toilet paper  .  IF YOU HAD POLYPS REMOVED DURING THE ENDOSCOPY:  Eat a soft diet IF YOU HAVE NAUSEA, BLOATING, ABDOMINAL PAIN, OR VOMITING.    FINDING OUT THE RESULTS OF YOUR TEST Not all test results are available during your visit. DR. Oneida Alar WILL CALL YOU WITHIN 7 DAYS OF YOUR PROCEDUE WITH YOUR RESULTS. Do not assume everything is normal if you have not heard from DR. Elliott Quade IN ONE WEEK, CALL HER OFFICE AT [phone].  SEEK IMMEDIATE MEDICAL ATTENTION AND CALL THE OFFICE: [phone] IF:  You have more than a spotting of blood in your stool.   Your belly is swollen (abdominal distention).   You are nauseated or vomiting.   You have a temperature over 101F.   You have abdominal pain or discomfort that is severe or gets worse throughout the day.   Gastritis  Gastritis is an inflammation (the body's way of reacting to injury and/or infection) of the stomach. It is often caused by viral or bacterial (germ) infections. It can also be caused BY ASPIRIN, BC/GOODY POWDER'S, (IBUPROFEN) MOTRIN, OR ALEVE (NAPROXEN), chemicals (including alcohol), SPICY FOODS, and medications. This illness may be associated with generalized malaise (feeling tired, not well), UPPER ABDOMINAL STOMACH cramps, and fever. One common bacterial cause of gastritis is an organism known as H. Pylori.  This can be treated with antibiotics.    High-Fiber Diet A high-fiber diet changes your normal diet to include more whole grains, legumes, fruits, and vegetables. Changes in the diet involve replacing refined carbohydrates with unrefined foods. The calorie level of the diet is essentially unchanged. The Dietary Reference Intake (recommended amount) for adult males is 38 grams per day. For adult females, it is 25 grams per day. Pregnant and lactating women should consume 28 grams of fiber per day. Fiber is the intact part of a  plant that is not broken down during digestion. Functional fiber is fiber that has been isolated from the plant to provide a beneficial effect in the body. PURPOSE  Increase stool bulk.   Ease and regulate bowel movements.   Lower cholesterol.  INDICATIONS THAT YOU NEED MORE FIBER  Constipation and hemorrhoids.   Uncomplicated diverticulosis (intestine condition) and irritable bowel syndrome.   Weight management.   As a protective measure against hardening of the arteries (atherosclerosis), diabetes, and cancer.   GUIDELINES FOR INCREASING FIBER IN THE DIET  Start adding fiber to the diet slowly. A gradual increase of about 5 more grams (2 slices of whole-wheat bread, 2 servings of most fruits or vegetables, or 1 bowl of high-fiber cereal) per day is best. Too rapid an increase in fiber may result in constipation, flatulence, and bloating.   Drink enough water and fluids to keep your urine clear or pale yellow. Water, juice, or caffeine-free drinks are recommended. Not drinking enough fluid may cause constipation.   Eat a variety of high-fiber foods rather than one type of fiber.   Try to increase your intake of fiber through using high-fiber foods rather than fiber pills or supplements that contain small amounts of fiber.   The goal is to change the types of food eaten. Do not supplement your present diet with high-fiber foods, but replace foods in your present diet.  INCLUDE A VARIETY OF FIBER SOURCES  Replace refined and processed grains with whole grains, canned fruits with fresh fruits, and incorporate other fiber sources. White rice, white breads, and most bakery goods contain little or no fiber.   Brown whole-grain rice, buckwheat oats, and many fruits and vegetables are all good sources of fiber. These include: broccoli, Brussels sprouts, cabbage, cauliflower, beets, sweet potatoes, white potatoes (skin on), carrots, tomatoes, eggplant, squash, berries, fresh fruits, and  dried fruits.   Cereals appear to be the richest source of fiber. Cereal fiber is found in whole grains and bran. Bran is the fiber-rich outer coat of cereal grain, which is largely removed in refining. In whole-grain cereals, the bran remains. In breakfast cereals, the largest amount of fiber is found in those with "bran" in their names. The fiber content is sometimes indicated on the label.   You may need to include additional fruits and vegetables each day.   In baking, for 1 cup white flour, you may use the following substitutions:   1 cup whole-wheat flour minus 2 tablespoons.   1/2 cup white flour plus 1/2 cup whole-wheat flour.    Low-Fat Diet BREADS, CEREALS, PASTA, RICE, DRIED PEAS, AND BEANS These products are high in carbohydrates and most are low in fat. Therefore, they can be increased in the diet as substitutes for fatty foods. They too, however, contain calories and should not be eaten in excess. Cereals can be eaten for snacks as well as for breakfast.   FRUITS AND VEGETABLES It is good to eat fruits and vegetables.  Besides being sources of fiber, both are rich in vitamins and some minerals. They help you get the daily allowances of these nutrients. Fruits and vegetables can be used for snacks and desserts.  MEATS Limit lean meat, chicken, [place], and fish to no more than 6 ounces per day. Beef, Pork, and Lamb Use lean cuts of beef, pork, and lamb. Lean cuts include:  Extra-lean ground beef.  Arm roast.  Sirloin tip.  Center-cut ham.  Round steak.  Loin chops.  Rump roast.  Tenderloin.  Trim all fat off the outside of meats before cooking. It is not necessary to severely decrease the intake of red meat, but lean choices should be made. Lean meat is rich in protein and contains a highly absorbable form of iron. Premenopausal women, in particular, should avoid reducing lean red meat because this could increase the risk for low red blood cells (iron-deficiency  anemia).  Chicken and [place] These are good sources of protein. The fat of poultry can be reduced by removing the skin and underlying fat layers before cooking. Chicken and [place] can be substituted for lean red meat in the diet. Poultry should not be fried or covered with high-fat sauces. Fish and Shellfish Fish is a good source of protein. Shellfish contain cholesterol, but they usually are low in saturated fatty acids. The preparation of fish is important. Like chicken and [place], they should not be fried or covered with high-fat sauces. EGGS Egg whites contain no fat or cholesterol. They can be eaten often. Try 1 to 2 egg whites instead of whole eggs in recipes or use egg substitutes that do not contain yolk. MILK AND DAIRY PRODUCTS Use skim or 1% milk instead of 2% or whole milk. Decrease whole milk, natural, and processed cheeses. Use nonfat or low-fat (2%) cottage cheese or low-fat cheeses made from vegetable oils. Choose nonfat or low-fat (1 to 2%) yogurt. Experiment with evaporated skim milk in recipes that call for heavy cream. Substitute low-fat yogurt or low-fat cottage cheese for sour cream in dips and salad dressings. Have at least 2 servings of low-fat dairy products, such as 2 glasses of skim (or 1%) milk each day to help get your daily calcium intake. FATS AND OILS Reduce the total intake of fats, especially saturated fat. Butterfat, lard, and beef fats are high in saturated fat and cholesterol. These should be avoided as much as possible. Vegetable fats do not contain cholesterol, but certain vegetable fats, such as coconut oil, palm oil, and palm kernel oil are very high in saturated fats. These should be limited. These fats are often used in bakery goods, processed foods, popcorn, oils, and nondairy creamers. Vegetable shortenings and some peanut butters contain hydrogenated oils, which are also saturated fats. Read the labels on these foods and check for saturated vegetable  oils. Unsaturated vegetable oils and fats do not raise blood cholesterol. However, they should be limited because they are fats and are high in calories. Total fat should still be limited to 30% of your daily caloric intake. Desirable liquid vegetable oils are corn oil, cottonseed oil, olive oil, canola oil, safflower oil, soybean oil, and sunflower oil. Peanut oil is not as good, but small amounts are acceptable. Buy a heart-healthy tub margarine that has no partially hydrogenated oils in the ingredients. Mayonnaise and salad dressings often are made from unsaturated fats, but they should also be limited because of their high calorie and fat content. Seeds, nuts, peanut butter, olives, and avocados are high in  fat, but the fat is mainly the unsaturated type. These foods should be limited mainly to avoid excess calories and fat. OTHER EATING TIPS Snacks  Most sweets should be limited as snacks. They tend to be rich in calories and fats, and their caloric content outweighs their nutritional value. Some good choices in snacks are graham crackers, melba toast, soda crackers, bagels (no egg), English muffins, fruits, and vegetables. These snacks are preferable to snack crackers, [place] fries, TORTILLA CHIPS, and POTATO chips. Popcorn should be air-popped or cooked in small amounts of liquid vegetable oil. Desserts Eat fruit, low-fat yogurt, and fruit ices instead of pastries, cake, and cookies. Sherbet, angel food cake, gelatin dessert, frozen low-fat yogurt, or other frozen products that do not contain saturated fat (pure fruit juice bars, frozen ice pops) are also acceptable.  COOKING METHODS Choose those methods that use little or no fat. They include: Poaching.  Braising.  Steaming.  Grilling.  Baking.  Stir-frying.  Broiling.  Microwaving.  Foods can be cooked in a nonstick pan without added fat, or use a nonfat cooking spray in regular cookware. Limit fried foods and avoid frying in saturated  fat. Add moisture to lean meats by using water, broth, cooking wines, and other nonfat or low-fat sauces along with the cooking methods mentioned above. Soups and stews should be chilled after cooking. The fat that forms on top after a few hours in the refrigerator should be skimmed off. When preparing meals, avoid using excess salt. Salt can contribute to raising blood pressure in some people.  EATING AWAY FROM HOME Order entres, potatoes, and vegetables without sauces or butter. When meat exceeds the size of a deck of cards (3 to 4 ounces), the rest can be taken home for another meal. Choose vegetable or fruit salads and ask for low-calorie salad dressings to be served on the side. Use dressings sparingly. Limit high-fat toppings, such as bacon, crumbled eggs, cheese, sunflower seeds, and olives. Ask for heart-healthy tub margarine instead of butter.  Hemorrhoids Hemorrhoids are dilated (enlarged) veins around the rectum. Sometimes clots will form in the veins. This makes them swollen and painful. These are called thrombosed hemorrhoids. Causes of hemorrhoids include:  Constipation.   Straining to have a bowel movement.   HEAVY LIFTING HOME CARE INSTRUCTIONS  Eat a well balanced diet and drink 6 to 8 glasses of water every day to avoid constipation. You may also use a bulk laxative.   Avoid straining to have bowel movements.   Keep anal area dry and clean.   Do not use a donut shaped pillow or sit on the toilet for long periods. This increases blood pooling and pain.   Move your bowels when your body has the urge; this will require less straining and will decrease pain and pressure.    High Iron Diet  Purpose Iron is a mineral essential for life. Found in red blood cells, irons primary role is to carry oxygen from the lungs to the rest of the body. Without oxygen, the bodys cells cannot function normally. If the bodys iron stores become too low, an iron-deficiency anemia can  occur. This is characterized by weakness, lethargy, muscle fatigue, and shortness of breath. In severe cases, a persons skin may become pale due to a lack of red blood cells in the body. In adults, iron deficiency is most commonly caused by chronic blood loss, such as with heavy menstruation or intestinal bleeding from peptic ulcers, cancer, or hemorrhoids. In children, iron deficiency  is usually the result of an inadequate iron intake.  Nutrition Facts The recommended dietary allowance (RDA) for iron in healthy adults is 10 milligrams per day for men and 15 milligrams per day for premenopausal women. Premenopausal womens needs are higher than mens needs because women lose iron during menstruation. It is generally easier for men to get enough iron than it is for women. Because they are usually bigger, men have higher calorie needs and will most likely eat enough food to meet their iron requirements. Women, on the other hand, tend to eat less. This makes it more difficult for them to meet their iron needs. It is, therefore, particularly important for premenopausal women to eat foods high in iron. Pregnant women will need as much as 30 milligrams of iron per day. The main reason is because the unborn baby needs iron for development. As a result, it will draw from the mothers iron stores. This can quickly deplete a woman of iron if she is not eating enough iron rich foods. The following table lists foods high in iron. In general, meat, fish, and poultry are excellent sources. Other sources of iron include beans, dried fruits, whole grains, fortified cereals, and enriched breads.   Special Considerations  1. Heme and nonhemd iron are two forms of iron in foods. Heme iron is found in meats, poultry, and fish. NonHeme iron is found in both plant and animal foods. Heme iron is more easily absorbed by the body than nonheme iron. However, heme iron can also promote the absorption of non-heme iron. Therefore,  eating beef and beans, for example, is good for providing adequate absorption of both types of iron. 2. Vitamin C also promotes iron absorption. This is true for both heme and nonheme iron. It is, therefore, beneficial to consume citrus fruits or juices, which are high in vitamin C, with foods that contain iron. For example, a meal might include a lean sirloin steak (heme iron source), baked potato (nonheme iron source , broccoli (nonheme iroj source), and an orange (vitamin C source) for a good iron intake. 3. Phytic and tannic aids are two food components that, when consumed in large amounts, prevent the abrorption of iron. Phytic acid is found in rye bread and other foods made from whole grains. Phytic acid is also found in nonherbal teas. Tannic acid is found in commercial black and pekoe teas, coffee, cola drinks, chocolate, and red wines. 4. Iron SupplementsThere are many different kinds of iron supplements. However, iron supplemants should only be taken when there is a true deficiency of iron and only under medical supervision. General multivitamins often have iron and other minerals added to them in moderate amounts. If otherwise healthy, this amount of iron is probably not harmful. If iron is to be avoided, multivitamins containing iron should not be used.Please note that it is important to keep iron and multivita-in supplements safely away from a childs reach. If ingested, severe poisoning can occur.   Foods That Contain Iron  Food Serving Size (mg)  Bran flakes cereal 1 cup 24.0  Product 19 cereal 1 cup 24.0  Clams, steamed 3 oz 23.8  Total cereal 1 cup 18.0  Life cereal 1 cup 12.2  Raisin bran cereal 1 cup 9.3  Beef liver, braised 3 oz 5.8  Kix cereal 1 cup 5.4  Cheerios cereal 1 cup 3.6  Prune juice 1 cup 3.0  Potato, baked with skin 1 med 2.8  Sirloin steak, cooked 3 oz 2.8  Shrimp, cooked  3 oz 2.6  [place] beans, cooked 1/2 cup 2.3  Figs, dried 5 2.1  Lean ground beef, broiled 3  oz 2.1  Swiss chard, cooked 1/2 cup 2.0  Rice krispies cereal 1 cup 1.8  Kidney beans 1/2 cup 1.6  Oatmeal, cooked 1/2 cup 1.6  Spinach, raw 1 cup 1.5  Tuna, canned in water 3 oz 1.3  Green peas, conked 1/2 cup 1.2  Halibut, cooked 3 oz 0.9  Whole-wheat bread 1 slice 0.9  Apricot halves, dried 5 0.8  Raisins 1/4 cup 0.8  Broccoli, cooked 1/2 cup 0.6  Egg, boiled 1 large 0.6

## 2014-01-23 NOTE — Progress Notes (Signed)
REVIEWED-NO ADDITIONAL RECOMMENDATIONS. 

## 2014-01-23 NOTE — H&P (Signed)
Primary Care Physician:  Sherrie Mustache, MD Primary Gastroenterologist:  Dr. Oneida Alar  Pre-Procedure History & Physical: HPI:  Bruce [name] is a 69 y.o. male here for HEME POS STOOLS/ANEMIA.  Past Medical History  Diagnosis Date  . Eosinophilia   . Thrombocytopenia   . COPD (chronic obstructive pulmonary disease)   . Obesity   . Degenerative joint disease   . Depression   . Gout   . Anemia 02/02/2011  . Hyperlipidemia   . Hip fracture, left January 2013.    Status post ORIF  . Wrist fracture, left   . Laceration of finger, index     left  . Asthma 12/01/2011    On symbicort 2 puffs BID  . Family history of anesthesia complication     brother had confusion after CABG  . Shortness of breath     with exertion  . Sinus disease   . GERD (gastroesophageal reflux disease)     takes Tums    Past Surgical History  Procedure Laterality Date  . Multiple tooth extractions    . Left hip orif  January 2013.    Dr. Luna Glasgow.  . Compression hip screw  02/01/2011    Procedure: COMPRESSION HIP;  Surgeon: Sanjuana Kava, MD;  Location: [facility];  Service: Orthopedics;  Laterality: Left;  . Orif femur fracture  02/01/2011    Procedure: OPEN REDUCTION INTERNAL FIXATION (ORIF) DISTAL FEMUR FRACTURE;  Surgeon: Sanjuana Kava, MD;  Location: [facility];  Service: Orthopedics;  Laterality: Left;  . Colonoscopy w/ biopsies and polypectomy  Feb 2011    Dr. Oneida Alar: small internal hemorrhoids, frequent diverticula, benign polypoid tissue and hyperplastic polyps   . Hardware removal Left 05/20/2013    Procedure: REMOVAL OF HARDWARE LEFT FEMUR WITH LOCAL BONE GRAFT;  Surgeon: Garald Balding, MD;  Location: [facility];  Service: Orthopedics;  Laterality: Left;    Prior to Admission medications   Medication Sig Start Date End Date Taking? Authorizing Provider  aspirin EC 81 MG tablet Take 1 tablet (81 mg total) by mouth daily. 05/21/13  Yes Biagio Borg, PA-C  atorvastatin (LIPITOR) 10 MG tablet Take 10  mg by mouth at bedtime.    Yes Historical Provider, MD  budesonide-formoterol (SYMBICORT) 80-4.5 MCG/ACT inhaler Inhale 2 puffs into the lungs 2 (two) times daily.     Yes Historical Provider, MD  citalopram (CELEXA) 20 MG tablet Take 20 mg by mouth daily.   Yes Historical Provider, MD  fish oil-omega-3 fatty acids 1000 MG capsule Take 1,000 mg by mouth at bedtime.    Yes Historical Provider, MD  mometasone (ELOCON) 0.1 % cream Apply 1 application topically daily. 12/29/13 12/29/14 Yes Historical Provider, MD  peg 3350 powder (MOVIPREP) 100 G SOLR Take 1 kit (200 g total) by mouth as directed. 01/05/14  Yes Danie Binder, MD    Allergies as of 01/05/2014 - Review Complete 01/05/2014  Allergen Reaction Noted  . Morphine and related Itching and Nausea Only 05/06/2013    Family History  Problem Relation Age of Onset  . Cancer Mother     leukemia  . Emphysema Father   . Cancer Sister   . Cancer Brother   . Colon cancer Brother     diagnosed at age 57, deceased age 4.     History   Social History  . Marital Status: Married    Spouse Name: N/A    Number of Children: N/A  . Years of Education: N/A   Occupational History  .  Not on file.   Social History Main Topics  . Smoking status: Former Smoker -- 4.00 packs/day for 20 years    Types: Cigarettes    Quit date: 05/13/1983  . Smokeless tobacco: Current User    Types: Chew  . Alcohol Use: 0.0 oz/week    0 Not specified per week     Comment: at times rarely  . Drug Use: No  . Sexual Activity: Not Currently   Other Topics Concern  . Not on file   Social History Narrative    Review of Systems: See HPI, otherwise negative ROS   Physical Exam: BP 165/107 mmHg  Pulse 60  Temp(Src) 97.7 F (36.5 C) (Oral)  Ht 5' 7.5" (1.715 m)  Wt 272 lb (123.378 kg)  BMI 41.95 kg/m2  SpO2 96% General:   Alert,  pleasant and cooperative in NAD Head:  Normocephalic and atraumatic. Neck:  Supple; Lungs:  Clear throughout to  auscultation.    Heart:  Regular rate and rhythm. Abdomen:  Soft, nontender and nondistended. Normal bowel sounds, without guarding, and without rebound.   Neurologic:  Alert and  oriented x4;  grossly normal neurologically.  Impression/Plan:    HEME POS STOOLS/ANEMIA  PLAN:  1.TCS/?EGD TODAY

## 2014-01-27 DIAGNOSIS — R195 Other fecal abnormalities: Secondary | ICD-10-CM | POA: Insufficient documentation

## 2014-01-27 NOTE — Op Note (Signed)
NAME:  NICKY, KRAS NO.:  [id number]  MEDICAL RECORD NO.:  [id number]  LOCATION:                                 FACILITY:  PHYSICIAN:  Barney Drain, M.D.     DATE OF BIRTH:  1945-09-08  DATE OF PROCEDURE:  01/23/2014 DATE OF DISCHARGE:  01/23/2014                              OPERATIVE REPORT   REFERRING PROVIDER:  Margarita Rana, M.D.  PROCEDURE:  Colonoscopy with cold forceps biopsy.  INDICATION:  Mr. Frye is a 69 year old male who presents with a normocytic anemia.  In December 2015, he had a hemoglobin of 12.5 with MCV of 85, platelet count 201, ferritin 24.  He has mild renal insufficiency noted at earliest May 2015 with a creatinine of 1.15 and a GFR of 66.  He reports having his stools checked and they were heme- positive.  He denies any prior blood or melena.  FINDINGS: 1. Normal terminal ileum. 2. Appreciated lipoma on last colonoscopy.  The lipoma is located in     the hepatic flexure.  Tunnel biopsies were obtained TODAY via cold     forceps. 3. A 3-mm sessile rectal polyp, removed via cold forceps. 4. Pan colonic diverticulosis which spares the cecum.  Diverticula     appreciated in the rectum as well.  IMPRESSION: 1. No obvious source for anemia identified. 2. One small rectal polyp, removed. 3. Moderate-sized hepatic flexure lipoma. 4. Severe pan-colonic and rectal diverticulosis.  RECOMMENDATIONS: 1. Encouraged the patient to eat food that contains iron.  Handout     given. 2. High-fiber low-fat diet.  Avoid IV dye caused bloating. 3. Await biopsies. 4. Followup in 4 months. 5. Next colonoscopy in 10 years with the benefits outweigh the risks.  PREP:  Quality good.  CECAL WITHDRAWAL TIME:  15 minutes. COMPLICATIONS:  None.  MEDICATIONS: 1. Demerol 75 mg IV. 2. Versed 6 mg IV. 3. Phenergan 12.5 mg IV.  PROCEDURE TECHNIQUE:  Physical exam was performed.  Informed consent was obtained.  For the patient, I have explained  the benefits, risks, and alternatives to the procedure.  The patient was connected, monitored, and placed in left lateral position.  Continuous oxygen was provided via nasal cannula and IV medicines administered through an indwelling cannula.  After administration of sedation and rectal exam, the patient's rectal was intubated and the scope was advanced under direct visualization to the ileum.  The scope was withdrawn by careful examining the colorectal anatomy and integrity of the mucosal way out. The patient was recovered in endoscopy and discharged home in satisfactory condition.   Barney Drain, M.D.     SF/MEDQ  D:  01/26/2014  T:  01/27/2014  Job:  [id number]

## 2014-01-28 ENCOUNTER — Encounter (HOSPITAL_COMMUNITY): Payer: Self-pay | Admitting: Gastroenterology

## 2014-01-28 NOTE — Op Note (Signed)
NAME:  Bruce [name], Bruce [name] NO.:  [id number]  MEDICAL RECORD NO.:  [id number]  LOCATION:  [facility]                          FACILITY:  [facility]  PHYSICIAN:  Barney Drain, M.D.     DATE OF BIRTH:  07/22/1945  DATE OF PROCEDURE:  01/23/2014 DATE OF DISCHARGE:  01/23/2014                              OPERATIVE REPORT   REFERRING PROVIDER:  Margarita Rana, M.D.  PROCEDURE:  Esophagogastroduodenoscopy with cold forceps biopsy.  INDICATION:  Bruce [name] is a 69 year old male who presents with normocytic anemia.  In December 2015, had hemoglobin 12.5 with an MCV of 85, platelets 201, ferritin 29.  He has mild renal insufficiency noted as early as May 2015 with a creatinine of 1.15 and a GFR of 66.  He reports having his stools checked and they were heme-positive.  He denies any bright red blood per rectum or melena.  FINDINGS: 1. Normal esophagus. 2. Mild gastritis.  Cold forceps biopsies obtained. 3. Normal duodenum.  Cold forceps biopsies obtained to evaluate for     celiac sprue as an etiology for his anemia.  IMPRESSION: 1. No obvious source for anemia identified. 2. Mild gastritis.  RECOMMENDATIONS: 1. Encouraged patient to eat food that contains iron. 2. Follow a high-fiber, low-fat diet.  Avoid items that cause bloating     and gas. 3. Await biopsies. 4. Followup in 4 months.  MEDICATIONS:  No additional medications.  All medications given during colonoscopy.  PROCEDURE TECHNIQUE:  Physical exam was performed.  Informed consent was obtained from the patient after explaining the benefits, risks, and alternatives to the procedure.  The patient was connected to the monitor and placed in left lateral position.  Continuous oxygen was provided by nasal cannula and IV medicine administered through an indwelling cannula.  After administration of sedation, the patient's esophagus was intubated and scope was advanced under direct visualization to the 2nd portion of  duodenum. Scope was removed slowly by careful examining the color, texture, anatomy, and integrity of the mucosal way out.  The patient was recovered in endoscopy and discharged home in satisfactory condition.     Barney Drain, M.D.     SF/MEDQ  D:  01/27/2014  T:  01/28/2014  Job:  [id number]  cc:   Margarita Rana, M.D. Fax: [phone]

## 2014-01-29 DIAGNOSIS — B86 Scabies: Secondary | ICD-10-CM | POA: Diagnosis not present

## 2014-02-03 DIAGNOSIS — J449 Chronic obstructive pulmonary disease, unspecified: Secondary | ICD-10-CM | POA: Diagnosis not present

## 2014-02-03 DIAGNOSIS — I10 Essential (primary) hypertension: Secondary | ICD-10-CM | POA: Diagnosis not present

## 2014-02-16 ENCOUNTER — Telehealth: Payer: Self-pay | Admitting: Gastroenterology

## 2014-02-16 MED ORDER — AMOXICILLIN 500 MG PO TABS: ORAL_TABLET | ORAL | Status: DC

## 2014-02-16 MED ORDER — CLARITHROMYCIN 500 MG PO TABS: ORAL_TABLET | ORAL | Status: DC

## 2014-02-16 MED ORDER — PANTOPRAZOLE SODIUM 40 MG PO TBEC: 40.0000 mg | DELAYED_RELEASE_TABLET | Freq: Two times a day (BID) | ORAL | Status: DC

## 2014-02-16 NOTE — Telephone Encounter (Signed)
PLEASE CALL PT. HIS stomach Bx showed H. Pylori infection.IT MAY BE THE CAUSE FOR HIS BORDERLINE LOW BLOOD COUNT. He needs AMOXICILLIN 500 mg 2 po BID for 10 days and Biaxin 500 mg po bid for 10 days. TAKE PROTONIX 40 mg BID for 10 days then 1 po dAILY forEVER. DO NOT TAKE LIPITOR WHILE TAKING THE ANTIBIOTICS. TAKE CELEXA EVERY OTHER DAY BECAUSE IF HE TAKES IT WITH THE BIAXAIN IT COULD CAUSE A FATAL HEART RHYTHM. Med side effects include NVD, abd pain, and metallic taste.   EAT FOODS THAT CONTAINS IRON.  FOLLOW A HIGH FIBER/LOW FAT DIET. AVOID ITEMS THAT CAUSE BLOATING.   HE NEEDS A CBC AND FERRITIN 1 WEEK PRIOR TO HIS OPV. FOLLOW UP IN 4 MOS E30 ANEMIA, H PYLORI GASTRITIS.  NEXT COLONOSCOPY IN 10 YEARS IF THE BENEFITS OUTWEIGH THE RISKS.Marland Kitchen

## 2014-02-16 NOTE — Telephone Encounter (Signed)
MEDICATION INTERACTIONS/RECOMMNEDATIONS REVIEWED WITH MICHELLE, [facility].

## 2014-02-17 ENCOUNTER — Telehealth: Payer: Self-pay

## 2014-02-17 ENCOUNTER — Encounter: Payer: Self-pay | Admitting: Gastroenterology

## 2014-02-17 ENCOUNTER — Other Ambulatory Visit: Payer: Self-pay

## 2014-02-17 DIAGNOSIS — D649 Anemia, unspecified: Secondary | ICD-10-CM

## 2014-02-17 NOTE — Telephone Encounter (Signed)
Lab orders on file to be mailed in May 2016.

## 2014-02-17 NOTE — Telephone Encounter (Signed)
Lab orders on file to be mailed 05/2014.

## 2014-02-17 NOTE — Telephone Encounter (Signed)
Pt is aware of results and instructions for the medications.

## 2014-02-17 NOTE — Telephone Encounter (Signed)
NEEDS LABS WEEK PRIOR TO APPOINTMENT, MADE APPOINTMENT FOR 06/18/14 FOR OFFICE VISIT

## 2014-02-17 NOTE — Telephone Encounter (Signed)
APPOINTMENT MADE AND LETTER SENT, ON RECALL FOR TCS °

## 2014-02-17 NOTE — Telephone Encounter (Signed)
Pt's wife called and asked for pt's results. She said pt was half asleep when I called him this morning. However, she was telling me most of what I had told him.  I told her that pt seemed to understand everything I was saying this morning and I will be glad to tell her if she will have him call me and tell me it is OK.  She said he had just went out to walk the dog. I asked her if he asked her to call me and she said no.

## 2014-02-18 ENCOUNTER — Telehealth: Payer: Self-pay | Admitting: Gastroenterology

## 2014-02-18 NOTE — Telephone Encounter (Signed)
Patient wife returning call.  PLease call back.  Spoke to patient and he told me it was ok to speak to his wife regarding call

## 2014-02-18 NOTE — Telephone Encounter (Signed)
Called and went over the pt's results with his wife. She is also aware of how he is to take his meds.

## 2014-03-05 ENCOUNTER — Telehealth: Payer: Self-pay | Admitting: Gastroenterology

## 2014-03-05 NOTE — Telephone Encounter (Signed)
Pt's wife Tamela Oddi) called asking to speak with AS. She said that her husband (patient) has broke out in a rash and its gotten bad and he is miserable. She wants to know if any of his medicines that AS put him on could be causing this. I told her that I could get the nurse, but she would rather speak with AS. Please advise and call them back at [phone]

## 2014-03-05 NOTE — Telephone Encounter (Signed)
LMOM for pt to return call. 

## 2014-03-05 NOTE — Telephone Encounter (Signed)
Likely not what we gave. Agree with seeing PCP.

## 2014-03-05 NOTE — Telephone Encounter (Signed)
Pt's wife, Tamela Oddi, called back. She said the pt has had a rash since before he came to the office.  I told her it must not be the meds that we gave then. She said while he was on the Amoxicillin the rash improved some but he has completed the H Pylori meds. I told her he will probably need to see his PCP but I will let Laban Emperor, NP know his concerns.  He is not on any new meds.  Please advise!

## 2014-03-05 NOTE — Telephone Encounter (Signed)
PT's wife is aware.

## 2014-03-07 DIAGNOSIS — L259 Unspecified contact dermatitis, unspecified cause: Secondary | ICD-10-CM | POA: Diagnosis not present

## 2014-03-12 DIAGNOSIS — I10 Essential (primary) hypertension: Secondary | ICD-10-CM | POA: Diagnosis not present

## 2014-03-12 DIAGNOSIS — J45909 Unspecified asthma, uncomplicated: Secondary | ICD-10-CM | POA: Diagnosis not present

## 2014-03-24 DIAGNOSIS — L259 Unspecified contact dermatitis, unspecified cause: Secondary | ICD-10-CM | POA: Diagnosis not present

## 2014-04-15 DIAGNOSIS — L259 Unspecified contact dermatitis, unspecified cause: Secondary | ICD-10-CM | POA: Diagnosis not present

## 2014-04-15 DIAGNOSIS — T7840XA Allergy, unspecified, initial encounter: Secondary | ICD-10-CM | POA: Diagnosis not present

## 2014-05-05 DIAGNOSIS — T50905A Adverse effect of unspecified drugs, medicaments and biological substances, initial encounter: Secondary | ICD-10-CM | POA: Diagnosis not present

## 2014-05-06 DIAGNOSIS — L309 Dermatitis, unspecified: Secondary | ICD-10-CM | POA: Diagnosis not present

## 2014-05-12 ENCOUNTER — Other Ambulatory Visit: Payer: Self-pay

## 2014-05-12 DIAGNOSIS — D649 Anemia, unspecified: Secondary | ICD-10-CM

## 2014-05-18 DIAGNOSIS — Z87891 Personal history of nicotine dependence: Secondary | ICD-10-CM | POA: Diagnosis not present

## 2014-05-18 DIAGNOSIS — L309 Dermatitis, unspecified: Secondary | ICD-10-CM | POA: Diagnosis not present

## 2014-06-04 DIAGNOSIS — L0100 Impetigo, unspecified: Secondary | ICD-10-CM | POA: Diagnosis not present

## 2014-06-04 DIAGNOSIS — L309 Dermatitis, unspecified: Secondary | ICD-10-CM | POA: Diagnosis not present

## 2014-06-10 LAB — CBC WITH DIFFERENTIAL/PLATELET
HCT: 40 %
Hemoglobin: 12.8 g/dL — AB (ref 13–17)
Platelets: 205

## 2014-06-11 DIAGNOSIS — L309 Dermatitis, unspecified: Secondary | ICD-10-CM | POA: Diagnosis not present

## 2014-06-18 ENCOUNTER — Ambulatory Visit: Payer: Medicare Other | Admitting: Gastroenterology

## 2014-06-22 DIAGNOSIS — L309 Dermatitis, unspecified: Secondary | ICD-10-CM | POA: Diagnosis not present

## 2014-06-22 DIAGNOSIS — L509 Urticaria, unspecified: Secondary | ICD-10-CM | POA: Diagnosis not present

## 2014-06-25 ENCOUNTER — Telehealth: Payer: Self-pay

## 2014-06-25 ENCOUNTER — Encounter: Payer: Self-pay | Admitting: Gastroenterology

## 2014-06-25 ENCOUNTER — Ambulatory Visit (INDEPENDENT_AMBULATORY_CARE_PROVIDER_SITE_OTHER): Payer: Medicare Other | Admitting: Gastroenterology

## 2014-06-25 VITALS — BP 185/73 | HR 54 | Temp 97.1°F | Ht 67.0 in | Wt 285.8 lb

## 2014-06-25 DIAGNOSIS — K2970 Gastritis, unspecified, without bleeding: Principal | ICD-10-CM

## 2014-06-25 DIAGNOSIS — D649 Anemia, unspecified: Secondary | ICD-10-CM | POA: Diagnosis not present

## 2014-06-25 DIAGNOSIS — B9681 Helicobacter pylori [H. pylori] as the cause of diseases classified elsewhere: Secondary | ICD-10-CM | POA: Insufficient documentation

## 2014-06-25 LAB — CBC
HCT: 38.5 % — ABNORMAL LOW (ref 39.0–52.0)
Hemoglobin: 12.4 g/dL — ABNORMAL LOW (ref 13.0–17.0)
MCH: 27.3 pg (ref 26.0–34.0)
MCHC: 32.2 g/dL (ref 30.0–36.0)
MCV: 84.8 fL (ref 78.0–100.0)
MPV: 9.5 fL (ref 8.6–12.4)
PLATELETS: 266 10*3/uL (ref 150–400)
RBC: 4.54 MIL/uL (ref 4.22–5.81)
RDW: 16.1 % — AB (ref 11.5–15.5)
WBC: 5.7 10*3/uL (ref 4.0–10.5)

## 2014-06-25 LAB — FERRITIN: FERRITIN: 20 ng/mL — AB (ref 22–322)

## 2014-06-25 NOTE — Assessment & Plan Note (Signed)
Mild anemia with low normal ferritin. H.pylori gastritis could explain this mild abnormality. Will recheck CBC and ferritin now.

## 2014-06-25 NOTE — Patient Instructions (Signed)
Please have blood work and the breath test done today. We will call with the results.  We are also going to try and get in touch with Dr. Tarri Glenn to discuss the need for a reflux medication for you.

## 2014-06-25 NOTE — Assessment & Plan Note (Signed)
Biopsy proven on EGD, s/p therapy with Amoxicillin and Biaxin. Currently not on a PPI at dermatologist's request (Dr. Tarri Glenn). Patient has had a rash since Oct, which is felt to be due to multiple medications. Dermatology has decreased any medications felt unnecessary per patient. Will reach out to Dr. Tarri Glenn to discuss need for PPI indefinitely. Urea breath test now.

## 2014-06-25 NOTE — Progress Notes (Signed)
Referring Provider: [name], MD Primary Care Physician:  Sherrie Mustache, MD Primary GI: Dr. Oneida Alar   Chief Complaint  Patient presents with  . Follow-up    HPI:   Bruce [name] is a 69 y.o. male presenting today with a history of mild, normocytic anemia with low ferritin at 24, heme positive stool, with EGD noting H.pylori gastritis and colonoscopy overall unimpressive. Treated with Amoxicillin and Biaxin. Needs repeat CBC and ferritin now.   Has been off of Protonix for at least 3 weeks. Has been dealing with a rash for months now, seeing a dermatologist who has taken him off multiple medications for the next few weeks. Dr. Tarri Glenn dermatologist. Sees him again in 2 months. Rash originally started in Oct 2015.   No abdominal pain, no N/V. No melena or hematochezia. Has a lot of indigestion/heartburn but eats a lot of spicy foods.   Past Medical History  Diagnosis Date  . Eosinophilia   . Thrombocytopenia   . COPD (chronic obstructive pulmonary disease)   . Obesity   . Degenerative joint disease   . Depression   . Gout   . Anemia 02/02/2011  . Hyperlipidemia   . Hip fracture, left January 2013.    Status post ORIF  . Wrist fracture, left   . Laceration of finger, index     left  . Asthma 12/01/2011    On symbicort 2 puffs BID  . Family history of anesthesia complication     brother had confusion after CABG  . Shortness of breath     with exertion  . Sinus disease   . GERD (gastroesophageal reflux disease)     takes Tums    Past Surgical History  Procedure Laterality Date  . Multiple tooth extractions    . Left hip orif  January 2013.    Dr. Luna Glasgow.  . Compression hip screw  02/01/2011    Procedure: COMPRESSION HIP;  Surgeon: Sanjuana Kava, MD;  Location: [facility];  Service: Orthopedics;  Laterality: Left;  . Orif femur fracture  02/01/2011    Procedure: OPEN REDUCTION INTERNAL FIXATION (ORIF) DISTAL FEMUR FRACTURE;  Surgeon: Sanjuana Kava, MD;   Location: [facility];  Service: Orthopedics;  Laterality: Left;  . Colonoscopy w/ biopsies and polypectomy  Feb 2011    Dr. Oneida Alar: small internal hemorrhoids, frequent diverticula, benign polypoid tissue and hyperplastic polyps   . Hardware removal Left 05/20/2013    Procedure: REMOVAL OF HARDWARE LEFT FEMUR WITH LOCAL BONE GRAFT;  Surgeon: Garald Balding, MD;  Location: [place];  Service: Orthopedics;  Laterality: Left;  . Colonoscopy N/A 01/23/2014    Dr. Oneida Alar: 1. No obvious  source for anemia identified. 2. one small rectal polyp, removed (hyperplastic polyp). 3. Moderate -sizeed hepatic flexure lipoma 4. Severe pan-colonic and rectal diverticulosis.   . Esophagogastroduodenoscopy N/A 01/23/2014    Dr. Oneida Alar: 1. No obvious source for anemia identified. 2. Mild gastritis. benign duodenal biopsy, chronic gastritis with H.pylori     Current Outpatient Prescriptions  Medication Sig Dispense Refill  . atorvastatin (LIPITOR) 10 MG tablet Take 10 mg by mouth at bedtime.     . budesonide-formoterol (SYMBICORT) 160-4.5 MCG/ACT inhaler Inhale 2 puffs into the lungs.    . budesonide-formoterol (SYMBICORT) 80-4.5 MCG/ACT inhaler Inhale 2 puffs into the lungs 2 (two) times daily.      . hydrOXYzine (ATARAX/VISTARIL) 25 MG tablet Take 50 mg by mouth.    . mometasone (ELOCON) 0.1 % cream Apply 1 application  topically daily.     No current facility-administered medications for this visit.    Allergies as of 06/25/2014 - Review Complete 06/25/2014  Allergen Reaction Noted  . Morphine and related Itching and Nausea Only 05/06/2013    Family History  Problem Relation Age of Onset  . Cancer Mother     leukemia  . Emphysema Father   . Cancer Sister   . Cancer Brother   . Colon cancer Brother     diagnosed at age 5, deceased age 60.     History   Social History  . Marital Status: Married    Spouse Name: N/A  . Number of Children: N/A  . Years of Education: N/A   Social History Main Topics  .  Smoking status: Former Smoker -- 4.00 packs/day for 20 years    Types: Cigarettes    Quit date: 05/13/1983  . Smokeless tobacco: Current User    Types: Chew  . Alcohol Use: 0.0 oz/week    0 Standard drinks or equivalent per week     Comment: at times rarely  . Drug Use: No  . Sexual Activity: Not Currently   Other Topics Concern  . None   Social History Narrative    Review of Systems: As mentioned in HPI  Physical Exam: BP 185/73 mmHg  Pulse 54  Temp(Src) 97.1 F (36.2 C)  Ht 5\' 7"  (1.702 m)  Wt 285 lb 12.8 oz (129.638 kg)  BMI 44.75 kg/m2 General:   Alert and oriented. No distress noted. Pleasant and cooperative.  Head:  Normocephalic and atraumatic. Eyes:  Conjuctiva clear without scleral icterus. Abdomen:  +BS, soft, largely obese, no TTP, query umbilical hernia, difficult to assess HSM due to large AP diameter Msk:  Symmetrical without gross deformities. Normal posture. Extremities:  Without edema. Neurologic:  Alert and  oriented x4;  grossly normal neurologically. Psych:  Alert and cooperative. Normal mood and affect.

## 2014-06-25 NOTE — Telephone Encounter (Signed)
Per Laban Emperor, NP, a letter was faxed to Dr. Tarri Glenn @ [phone] asking if it is OK to restart Protonix.

## 2014-06-26 NOTE — Telephone Encounter (Signed)
Please let patient know Dr. Tarri Glenn is now aware of Protonix and did not realize he had been on it. It was not on his medication list. IT IS OK TO RESTART. He needs to be on once daily dosing indefinitely.

## 2014-06-26 NOTE — Progress Notes (Signed)
Quick Note:  Urea breath test pending.  Ferritin lower than 5 months ago. If H.pylori test is NEGATIVE, we need to pursue capsule study due to persistent anemia. ______

## 2014-06-29 NOTE — Telephone Encounter (Signed)
Pt's wife is aware and said she will tell him.

## 2014-06-30 ENCOUNTER — Other Ambulatory Visit: Payer: Self-pay

## 2014-06-30 DIAGNOSIS — K2970 Gastritis, unspecified, without bleeding: Principal | ICD-10-CM

## 2014-06-30 DIAGNOSIS — B9681 Helicobacter pylori [H. pylori] as the cause of diseases classified elsewhere: Secondary | ICD-10-CM

## 2014-06-30 NOTE — Progress Notes (Signed)
Quick Note:  The lab did not have the kits for the breath test on the day his blood was drawn. I have faxed a new order to the lab since it looked like the previous one had been cancelled in epic.  Pt's wife is aware of his ferritin results and that he needs to go to the lab tomorrow. ______

## 2014-07-01 NOTE — Progress Notes (Signed)
cc'd to pcp 

## 2014-07-22 NOTE — Progress Notes (Signed)
Quick Note:  Please remind patient to have breath test completed. He may need a capsule study. His ferritin is dropping. ______

## 2014-07-22 NOTE — Progress Notes (Signed)
Quick Note:  LMOM to get his breath test done and to call with questions. ______

## 2014-08-06 DIAGNOSIS — L509 Urticaria, unspecified: Secondary | ICD-10-CM | POA: Diagnosis not present

## 2014-10-13 NOTE — Progress Notes (Signed)
Quick Note:  I mailed pt a letter and lab order and ask him to do as soon as possible so we can follow up on his iron deficiency anemia. ______

## 2014-10-13 NOTE — Progress Notes (Signed)
Quick Note:  Needs breath test. Please let patient know that we need to determine if he needs a capsule study. This is important, as his IDA is worsening. ______

## 2014-11-06 DIAGNOSIS — B9681 Helicobacter pylori [H. pylori] as the cause of diseases classified elsewhere: Secondary | ICD-10-CM | POA: Diagnosis not present

## 2014-11-09 LAB — H. PYLORI BREATH TEST: H. pylori Breath Test: DETECTED — AB

## 2014-11-12 DIAGNOSIS — Z23 Encounter for immunization: Secondary | ICD-10-CM | POA: Diagnosis not present

## 2014-11-20 DIAGNOSIS — H578 Other specified disorders of eye and adnexa: Secondary | ICD-10-CM | POA: Diagnosis not present

## 2014-11-26 ENCOUNTER — Other Ambulatory Visit: Payer: Self-pay | Admitting: Gastroenterology

## 2014-11-26 MED ORDER — BIS SUBCIT-METRONID-TETRACYC 140-125-125 MG PO CAPS: 3.0000 | ORAL_CAPSULE | Freq: Three times a day (TID) | ORAL | Status: DC

## 2014-11-26 NOTE — Progress Notes (Signed)
Quick Note:  I called and informed pt's wife. She is aware insurance did not cover the Pylera and I have samples for him . She is aware he needs to take the Protonix bid while on this for 10 days and then go back to once daily.  SHE ALSO SAID PT IS HAVING A LOT OF SWELLING IN HIS STOMACH AND SHE WANTS TO KNOW IF THE SWELLING COULD BE COMING FROM THE H PYLORI. SHE SAID HE HAS REALLY INCREASED IN HIS STOMACH. NO LOWER EXTREMITY SWELLING.   PLEASE ADVISE! ______

## 2014-11-26 NOTE — Progress Notes (Signed)
Quick Note:  Patient failed treatment with Prevpac. Needs Pylera. I sent this to his pharmacy. NEEDS TO TAKE IT ALL AS DIRECTED. Do not skip doses. Take Protonix BID along with Pylera, then back to once daily. ______

## 2014-11-26 NOTE — Progress Notes (Signed)
Quick Note:  Recommend a non-urgent office visit. ______

## 2014-11-27 NOTE — Progress Notes (Signed)
Quick Note:  I called and spoke to pt's wife. She said he has gotten so big, she has had to buy him new pants. He gets short of breath so easily, ( has COPD though). Per Talmage Coin to schedule urgent visit instead. Pt is scheduled to see Neil Crouch, PA on 12/02/2014 at 11:30 Am.  She is aware if he worsens to see PCP or go to the ED. ______

## 2014-12-02 ENCOUNTER — Ambulatory Visit: Payer: Medicare Other | Admitting: Gastroenterology

## 2014-12-03 ENCOUNTER — Ambulatory Visit (INDEPENDENT_AMBULATORY_CARE_PROVIDER_SITE_OTHER): Payer: Medicare Other | Admitting: Gastroenterology

## 2014-12-03 ENCOUNTER — Encounter: Payer: Self-pay | Admitting: Gastroenterology

## 2014-12-03 VITALS — BP 163/71 | HR 57 | Temp 97.6°F | Ht 67.0 in | Wt 288.6 lb

## 2014-12-03 DIAGNOSIS — B9681 Helicobacter pylori [H. pylori] as the cause of diseases classified elsewhere: Secondary | ICD-10-CM

## 2014-12-03 DIAGNOSIS — R1011 Right upper quadrant pain: Secondary | ICD-10-CM | POA: Diagnosis not present

## 2014-12-03 DIAGNOSIS — K2970 Gastritis, unspecified, without bleeding: Principal | ICD-10-CM

## 2014-12-03 DIAGNOSIS — R109 Unspecified abdominal pain: Secondary | ICD-10-CM | POA: Insufficient documentation

## 2014-12-03 MED ORDER — ESOMEPRAZOLE MAGNESIUM 40 MG PO CPDR: 40.0000 mg | DELAYED_RELEASE_CAPSULE | Freq: Two times a day (BID) | ORAL | Status: DC

## 2014-12-03 NOTE — Patient Instructions (Signed)
Stop Protonix. I want you to take the Nexium samples TWICE A DAY, 30 minutes before breakfast and dinner, while you are taking the antibiotics. When you are done with the antibiotics, go back to pantoprazole (Protonix) once a day.   Please call if any further abdominal pain. Avoid lifting heavy objects, pushing, pulling, straining. Try to sit down before lifting up your granddaughter.   We will see you in 3 months!

## 2014-12-03 NOTE — Progress Notes (Signed)
Referring Provider: [name], MD Primary Care Physician:  Sherrie Mustache, MD Primary GI: Dr. Oneida Alar   Chief Complaint  Patient presents with  . Follow-up    HPI:   Bruce [name] is a 69 y.o. male presenting today with a history of mild, normocytic anemia with low ferritin at 24, heme positive stool, with EGD noting H.pylori gastritis and colonoscopy overall unimpressive. Treated with Amoxicillin and Biaxin. Repeat breath test to ensure eradication was Still POSITIVE. Prescribed Pylera. He has just started taking this. He returns today urgently due to concerns from wife regarding abdominal swelling and pain.   Lifting up granddaughter, had a bulging out in RUQ, took his breath away. Only has pain with picking up granddaughter, who weighs about 21-22 pounds. No issues while working. No N/V. Feels bloated after dinner. Not taking the PPI twice a day with Pylera as prescribed. Hasn't had the pain in 2-3 weeks.    Past Medical History  Diagnosis Date  . Eosinophilia   . Thrombocytopenia ([place])   . COPD (chronic obstructive pulmonary disease) ([place])   . Obesity   . Degenerative joint disease   . Depression   . Gout   . Anemia 02/02/2011  . Hyperlipidemia   . Hip fracture, left [facility]) January 2013.    Status post ORIF  . Wrist fracture, left   . Laceration of finger, index     left  . Asthma 12/01/2011    On symbicort 2 puffs BID  . Family history of anesthesia complication     brother had confusion after CABG  . Shortness of breath     with exertion  . Sinus disease   . GERD (gastroesophageal reflux disease)     takes Tums    Past Surgical History  Procedure Laterality Date  . Multiple tooth extractions    . Left hip orif  January 2013.    Dr. Luna Glasgow.  . Compression hip screw  02/01/2011    Procedure: COMPRESSION HIP;  Surgeon: Sanjuana Kava, MD;  Location: [facility];  Service: Orthopedics;  Laterality: Left;  . Orif femur fracture  02/01/2011    Procedure:  OPEN REDUCTION INTERNAL FIXATION (ORIF) DISTAL FEMUR FRACTURE;  Surgeon: Sanjuana Kava, MD;  Location: [facility];  Service: Orthopedics;  Laterality: Left;  . Colonoscopy w/ biopsies and polypectomy  Feb 2011    Dr. Oneida Alar: small internal hemorrhoids, frequent diverticula, benign polypoid tissue and hyperplastic polyps   . Hardware removal Left 05/20/2013    Procedure: REMOVAL OF HARDWARE LEFT FEMUR WITH LOCAL BONE GRAFT;  Surgeon: Garald Balding, MD;  Location: [place];  Service: Orthopedics;  Laterality: Left;  . Colonoscopy N/A 01/23/2014    Dr. Oneida Alar: 1. No obvious  source for anemia identified. 2. one small rectal polyp, removed (hyperplastic polyp). 3. Moderate -sizeed hepatic flexure lipoma 4. Severe pan-colonic and rectal diverticulosis.   . Esophagogastroduodenoscopy N/A 01/23/2014    Dr. Oneida Alar: 1. No obvious source for anemia identified. 2. Mild gastritis. benign duodenal biopsy, chronic gastritis with H.pylori     Current Outpatient Prescriptions  Medication Sig Dispense Refill  . atorvastatin (LIPITOR) 10 MG tablet Take 10 mg by mouth at bedtime.     . budesonide-formoterol (SYMBICORT) 80-4.5 MCG/ACT inhaler Inhale 2 puffs into the lungs 2 (two) times daily.      . hydrOXYzine (ATARAX/VISTARIL) 25 MG tablet Take 50 mg by mouth.    . mometasone (ELOCON) 0.1 % cream Apply 1 application topically daily.    Marland Kitchen  bismuth-metronidazole-tetracycline (PYLERA) 140-125-125 MG capsule Take 3 capsules by mouth 4 (four) times daily -  before meals and at bedtime. (Patient not taking: Reported on 12/03/2014) 120 capsule 0  . budesonide-formoterol (SYMBICORT) 160-4.5 MCG/ACT inhaler Inhale 2 puffs into the lungs.     No current facility-administered medications for this visit.    Allergies as of 12/03/2014 - Review Complete 12/03/2014  Allergen Reaction Noted  . Morphine and related Itching and Nausea Only 05/06/2013    Family History  Problem Relation Age of Onset  . Cancer Mother     leukemia    . Emphysema Father   . Cancer Sister   . Cancer Brother   . Colon cancer Brother     diagnosed at age 66, deceased age 44.     Social History   Social History  . Marital Status: Married    Spouse Name: N/A  . Number of Children: N/A  . Years of Education: N/A   Social History Main Topics  . Smoking status: Former Smoker -- 4.00 packs/day for 20 years    Types: Cigarettes    Quit date: 05/13/1983  . Smokeless tobacco: Current User    Types: Chew  . Alcohol Use: 0.0 oz/week    0 Standard drinks or equivalent per week     Comment: at times rarely  . Drug Use: No  . Sexual Activity: Not Currently   Other Topics Concern  . None   Social History Narrative    Review of Systems: As mentioned in HPI   Physical Exam: BP 163/71 mmHg  Pulse 57  Temp(Src) 97.6 F (36.4 C) (Oral)  Ht 5\' 7"  (1.702 m)  Wt 288 lb 9.6 oz (130.908 kg)  BMI 45.19 kg/m2 General:   Alert and oriented. No distress noted. Pleasant and cooperative.  Head:  Normocephalic and atraumatic. Eyes:  Conjuctiva clear without scleral icterus. Mouth:  Oral mucosa pink and moist. Good dentition. No lesions. Abdomen:  +BS, soft, largely obese, no appreciable hernia, no tenderness to palpation, unable to truly assess HSM due to large AP diameter Msk:  Symmetrical without gross deformities. Normal posture. Extremities:  Without edema. Neurologic:  Alert and  oriented x4;  grossly normal neurologically. Psych:  Alert and cooperative. Normal mood and affect.

## 2014-12-03 NOTE — Assessment & Plan Note (Signed)
Treated with prevpac originally, with repeat breath test still noting H.pylori. Unsuccessful eradication. I question if he was unknowingly uncompliant with this. Now recently starting Pylera. Not on PPI BID as he should.   Nexium samples provided for BID dosing while on Pylera, then back to Pylera.  Return in 3 months.

## 2014-12-03 NOTE — Assessment & Plan Note (Signed)
Sounds musculoskeletal, associated with lifting his granddaughter, complains of "bulging" but no obvious hernia noted on exam. However, patient has quite a large AP diameter and may very well have a ventral hernia. Discussed abdominal imaging; however, he would like to hold off on this unless pain recurs. Will have him return in 3 months, avoid lifting, pushing, pulling.

## 2014-12-07 NOTE — Progress Notes (Signed)
CC'D TO PCP °

## 2014-12-08 ENCOUNTER — Ambulatory Visit: Payer: Medicare Other | Admitting: Gastroenterology

## 2015-02-03 ENCOUNTER — Encounter: Payer: Self-pay | Admitting: General Practice

## 2015-02-05 DIAGNOSIS — H35711 Central serous chorioretinopathy, right eye: Secondary | ICD-10-CM | POA: Diagnosis not present

## 2015-02-05 DIAGNOSIS — H04123 Dry eye syndrome of bilateral lacrimal glands: Secondary | ICD-10-CM | POA: Diagnosis not present

## 2015-02-05 DIAGNOSIS — H2513 Age-related nuclear cataract, bilateral: Secondary | ICD-10-CM | POA: Diagnosis not present

## 2015-02-17 IMAGING — CR DG CHEST 2V
2 series · 2 of 2 positions shown · non-contrast
Comparison: 05/12/2013

CLINICAL DATA: Shortness of Breath

EXAM:
CHEST  2 VIEW

[view not recorded (1 of 2)]
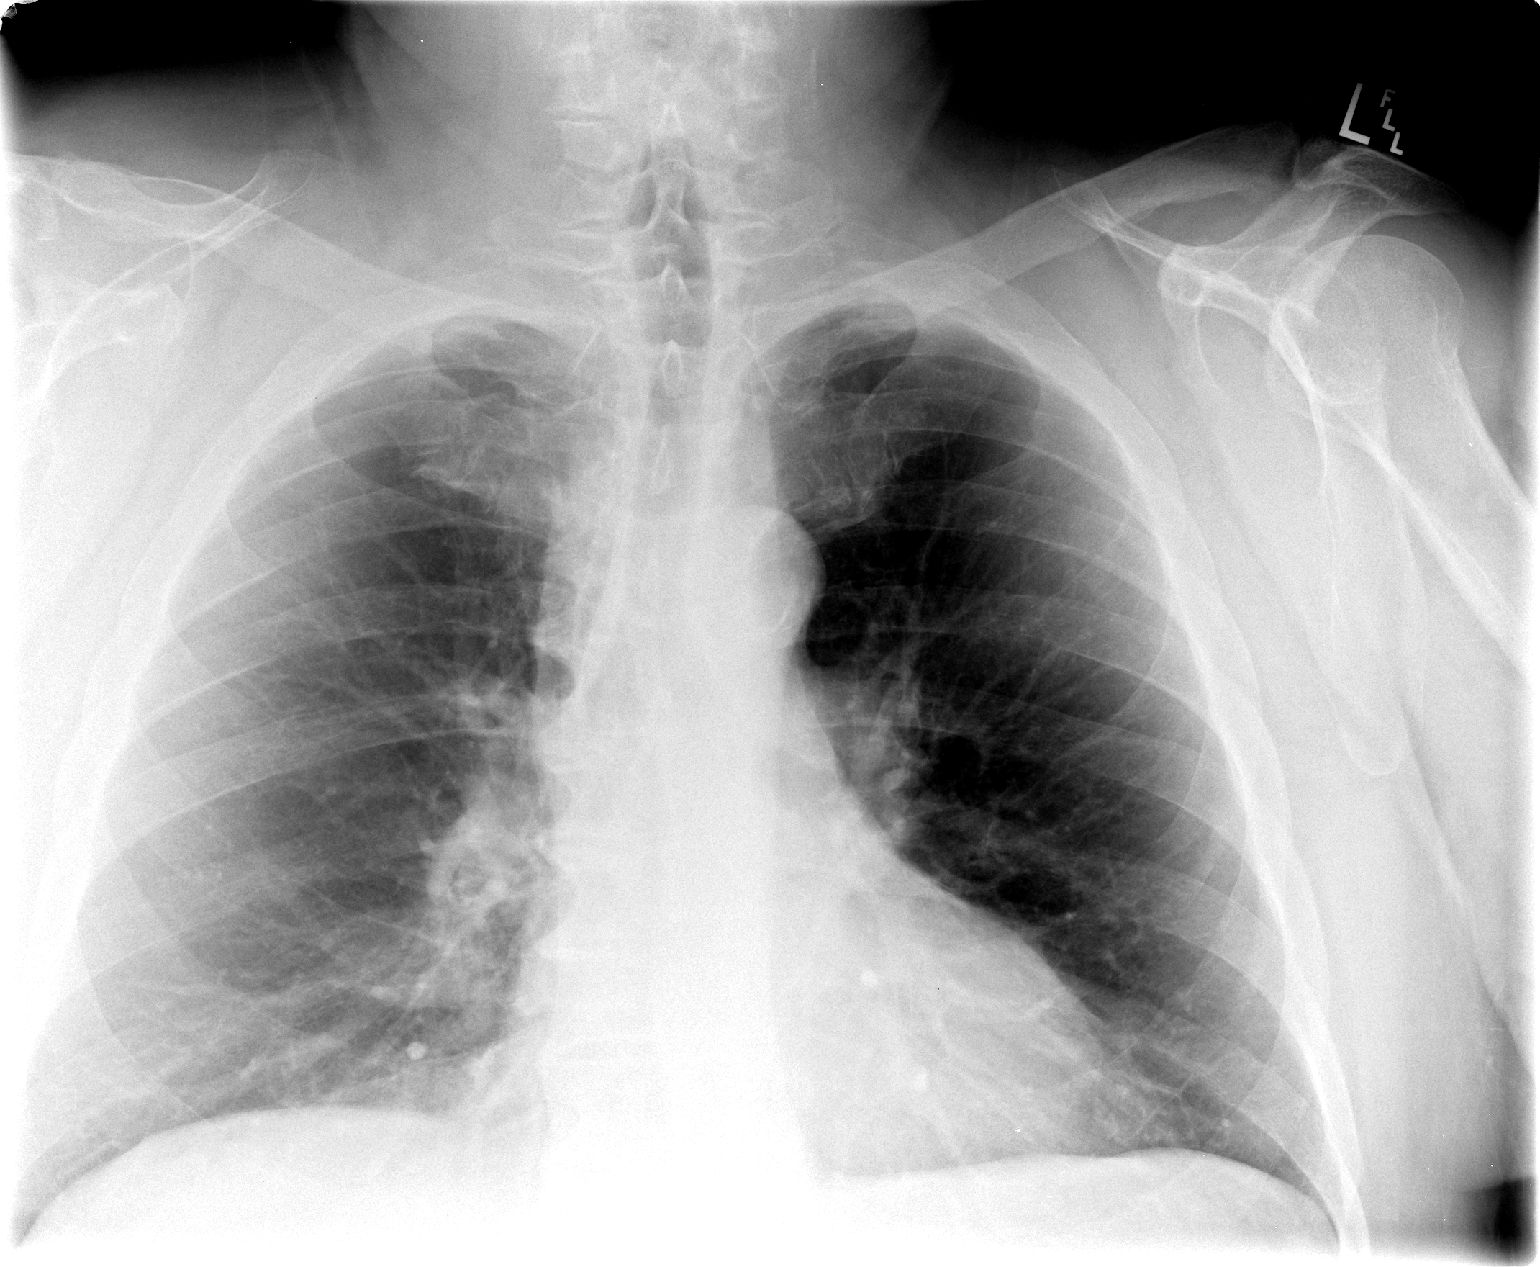

[view not recorded (2 of 2)]
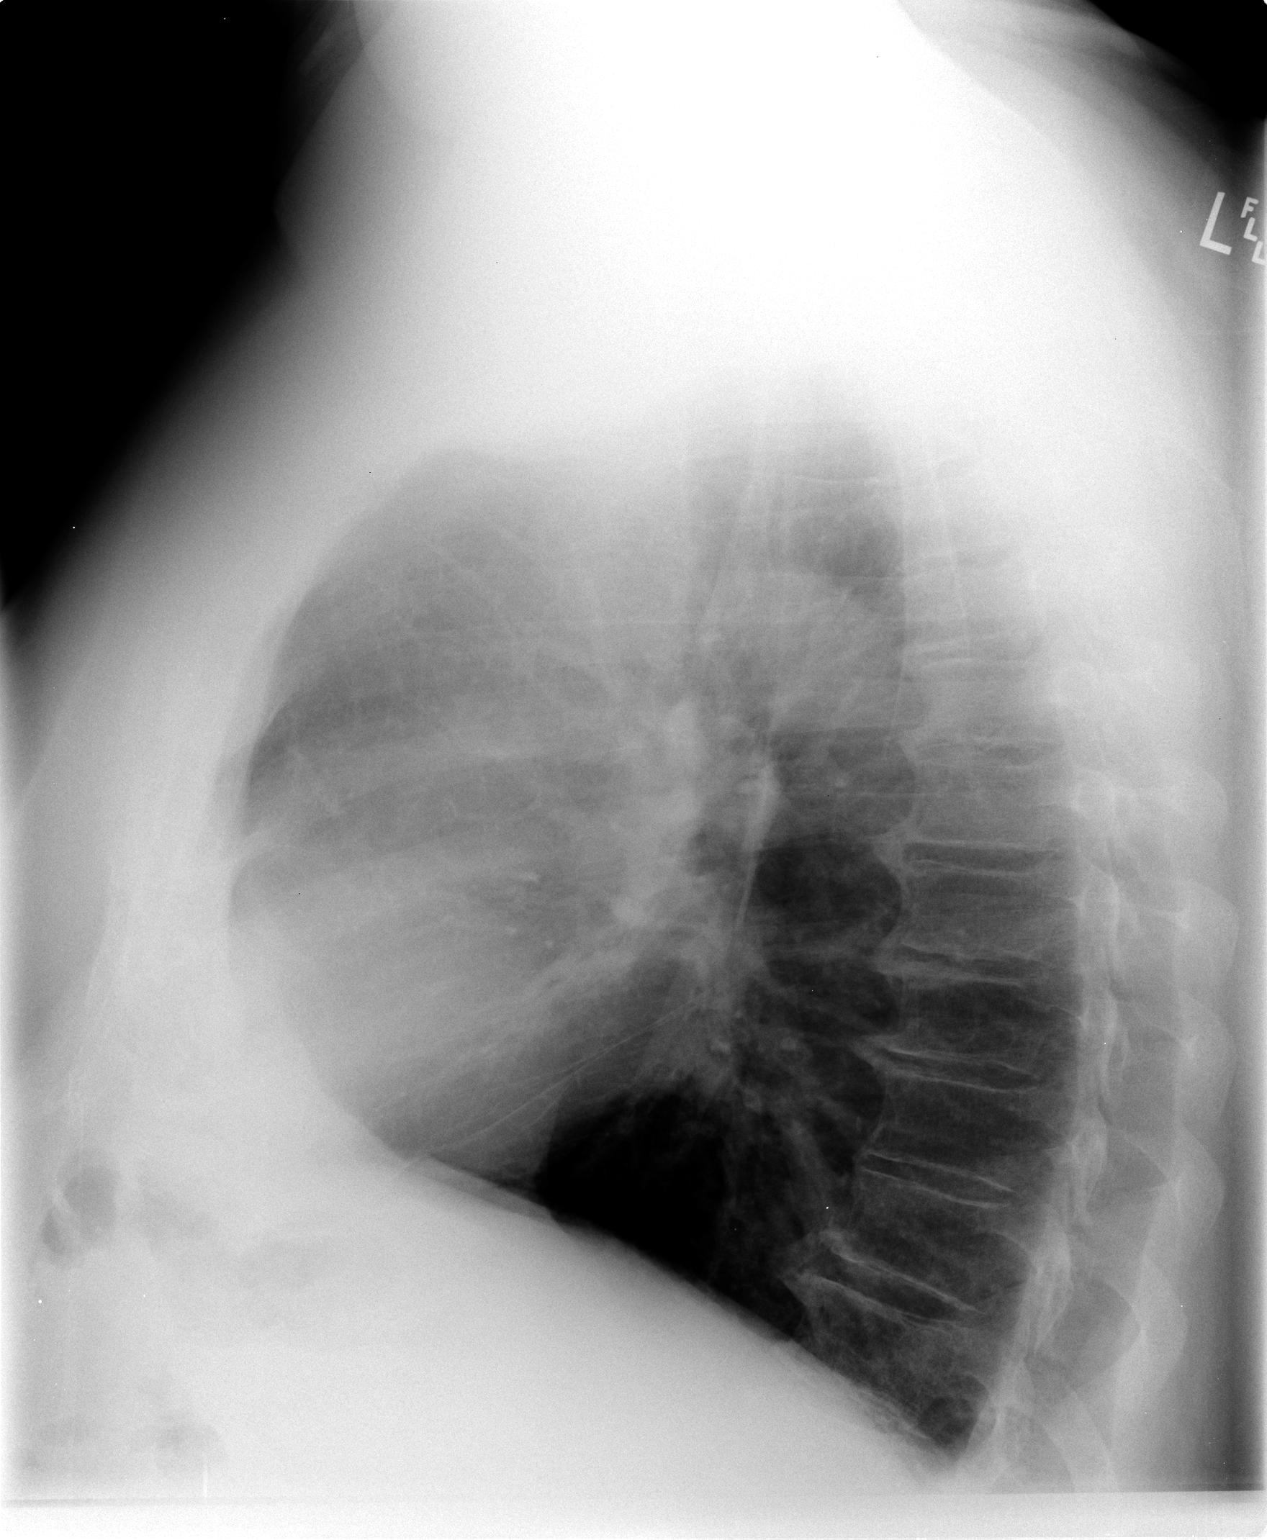

[2 of 2 positions shown; findings below may reference images not displayed]

FINDINGS: Cardiomediastinal silhouette is unremarkable. No acute infiltrate or
pleural effusion. No pulmonary edema. Mild degenerative changes
thoracic spine.
IMPRESSION: No active cardiopulmonary disease.

## 2015-02-18 DIAGNOSIS — Z139 Encounter for screening, unspecified: Secondary | ICD-10-CM | POA: Diagnosis not present

## 2015-02-18 DIAGNOSIS — Z125 Encounter for screening for malignant neoplasm of prostate: Secondary | ICD-10-CM | POA: Diagnosis not present

## 2015-02-18 DIAGNOSIS — N400 Enlarged prostate without lower urinary tract symptoms: Secondary | ICD-10-CM | POA: Diagnosis not present

## 2015-02-18 DIAGNOSIS — E784 Other hyperlipidemia: Secondary | ICD-10-CM | POA: Diagnosis not present

## 2015-02-18 DIAGNOSIS — I10 Essential (primary) hypertension: Secondary | ICD-10-CM | POA: Diagnosis not present

## 2015-02-18 DIAGNOSIS — J449 Chronic obstructive pulmonary disease, unspecified: Secondary | ICD-10-CM | POA: Diagnosis not present

## 2015-02-18 DIAGNOSIS — K219 Gastro-esophageal reflux disease without esophagitis: Secondary | ICD-10-CM | POA: Diagnosis not present

## 2015-03-10 ENCOUNTER — Ambulatory Visit: Payer: Medicare Other | Admitting: Gastroenterology

## 2015-03-10 ENCOUNTER — Encounter: Payer: Self-pay | Admitting: Gastroenterology

## 2015-03-10 ENCOUNTER — Ambulatory Visit (INDEPENDENT_AMBULATORY_CARE_PROVIDER_SITE_OTHER): Payer: Medicare Other | Admitting: Gastroenterology

## 2015-03-10 VITALS — BP 145/85 | HR 79 | Temp 97.6°F | Ht 67.5 in | Wt 286.8 lb

## 2015-03-10 DIAGNOSIS — K2970 Gastritis, unspecified, without bleeding: Principal | ICD-10-CM

## 2015-03-10 DIAGNOSIS — D649 Anemia, unspecified: Secondary | ICD-10-CM | POA: Diagnosis not present

## 2015-03-10 DIAGNOSIS — B9681 Helicobacter pylori [H. pylori] as the cause of diseases classified elsewhere: Secondary | ICD-10-CM

## 2015-03-10 NOTE — Patient Instructions (Signed)
1. Stop pantoprazole UNTIL breath test is done 03/31/15 or after then make sure to resume pantoprazole after the test. We are trying to make sure H.pylori bug is gone. Also, you cannot take any acid reducers or antibiotics from now until test is done. If you have to have antibiotics in the meantime for any reason, let us know so we can reschedule the breath test. 2. Please call us or the lab prior to going to verify they have the breath test kit available. 3. You will need blood work also the day you go for the breath test. Take your lab orders with you.

## 2015-03-10 NOTE — Assessment & Plan Note (Signed)
History of H. pylori gastritis, normocytic anemia with low ferritin. Initially treated with Prevpac but failed eradication. Pylera back in the fall. Plan to check H. pylori breath test at this time. Also update anemia labs. Patient tells me has a history of eosinophilia, used to be followed by hematology but hasn't followed up in years. Will obtain CBC with differential. Patient given specific instructions to hold PPI for 3 weeks prior to going for breath test. Unable to take antibiotics during this time and prefer no acid reducers as well. Obtain copy of most recent labs from PCP. Further recommendations to follow.

## 2015-03-10 NOTE — Progress Notes (Signed)
      Primary Care Physician: Sherrie Mustache, MD  Primary Gastroenterologist:  Barney Drain, MD   Chief Complaint  Patient presents with  . Follow-up    HPI: Bruce [name] is a 70 y.o. male here for follow-up. History of normocytic anemia with low normal ferritin, mild renal insufficiency. EGD in January 2016 with H. pylori gastritis. Colonoscopy essentially unremarkable as far as anemia. Next colonoscopy planned in January 2026. Small bowel biopsies negative for celiac. Patient initially treated with amoxicillin and Biaxin with PPI twice a day. H. pylori breath test was positive at October 2016. Subsequently retreated with Pylera although patient was not taking PPI appropriately along with therapy initially. Weight up 14 pounds in the past one year. Labs done with Dr. Edrick Oh a few weeks ago. He denies any abdominal pain, heartburn, appetite concerns, constipation, diarrhea, melena, rectal bleeding.  Current Outpatient Prescriptions  Medication Sig Dispense Refill  . atorvastatin (LIPITOR) 10 MG tablet Take 10 mg by mouth at bedtime.     . budesonide-formoterol (SYMBICORT) 160-4.5 MCG/ACT inhaler Inhale into the lungs. Reported on 03/10/2015    . citalopram (CELEXA) 20 MG tablet     . hydrOXYzine (ATARAX/VISTARIL) 25 MG tablet Take by mouth.    . pantoprazole (PROTONIX) 40 MG tablet Take 40 mg by mouth daily.    . pravastatin (PRAVACHOL) 80 MG tablet Take by mouth.    . budesonide-formoterol (SYMBICORT) 160-4.5 MCG/ACT inhaler Inhale 2 puffs into the lungs.     No current facility-administered medications for this visit.    Allergies as of 03/10/2015 - Review Complete 03/10/2015  Allergen Reaction Noted  . Morphine and related Itching and Nausea Only 05/06/2013    ROS:  General: Negative for anorexia, weight loss, fever, chills, fatigue, weakness. ENT: Negative for hoarseness, difficulty swallowing , nasal congestion. CV: Negative for chest pain, angina, palpitations,  dyspnea on exertion, peripheral edema.  Respiratory: Negative for dyspnea at rest, dyspnea on exertion, cough, sputum, wheezing.  GI: See history of present illness. GU:  Negative for dysuria, hematuria, urinary incontinence, urinary frequency, nocturnal urination.  Endo: Negative for unusual weight change.    Physical Examination:   BP 145/85 mmHg  Pulse 79  Temp(Src) 97.6 F (36.4 C) (Oral)  Ht 5' 7.5" (1.715 m)  Wt 286 lb 12.8 oz (130.092 kg)  BMI 44.23 kg/m2  General: Well-nourished, well-developed in no acute distress.  Eyes: No icterus. Mouth: Oropharyngeal mucosa moist and pink , no lesions erythema or exudate. Lungs: Clear to auscultation bilaterally.  Heart: Regular rate and rhythm, no murmurs rubs or gallops.  Abdomen: Bowel sounds are normal, nontender, nondistended, no hepatosplenomegaly or masses, no abdominal bruits or hernia , no rebound or guarding.   Extremities: No lower extremity edema. No clubbing or deformities. Neuro: Alert and oriented x 4   Skin: Warm and dry, no jaundice.   Psych: Alert and cooperative, normal mood and affect.

## 2015-03-10 NOTE — Progress Notes (Signed)
cc'ed to pcp °

## 2015-03-31 ENCOUNTER — Telehealth: Payer: Self-pay

## 2015-03-31 DIAGNOSIS — H353131 Nonexudative age-related macular degeneration, bilateral, early dry stage: Secondary | ICD-10-CM | POA: Diagnosis not present

## 2015-03-31 DIAGNOSIS — H04123 Dry eye syndrome of bilateral lacrimal glands: Secondary | ICD-10-CM | POA: Diagnosis not present

## 2015-03-31 DIAGNOSIS — D649 Anemia, unspecified: Secondary | ICD-10-CM | POA: Diagnosis not present

## 2015-03-31 DIAGNOSIS — H5203 Hypermetropia, bilateral: Secondary | ICD-10-CM | POA: Diagnosis not present

## 2015-03-31 DIAGNOSIS — H2513 Age-related nuclear cataract, bilateral: Secondary | ICD-10-CM | POA: Diagnosis not present

## 2015-03-31 DIAGNOSIS — B9681 Helicobacter pylori [H. pylori] as the cause of diseases classified elsewhere: Secondary | ICD-10-CM | POA: Diagnosis not present

## 2015-03-31 LAB — CBC WITH DIFFERENTIAL/PLATELET
Basophils Absolute: 0.1 10*3/uL (ref 0.0–0.1)
Basophils Relative: 1 % (ref 0–1)
EOS ABS: 1.2 10*3/uL — AB (ref 0.0–0.7)
EOS PCT: 14 % — AB (ref 0–5)
HEMATOCRIT: 42.7 % (ref 39.0–52.0)
Hemoglobin: 13 g/dL (ref 13.0–17.0)
LYMPHS PCT: 15 % (ref 12–46)
Lymphs Abs: 1.3 10*3/uL (ref 0.7–4.0)
MCH: 22.9 pg — ABNORMAL LOW (ref 26.0–34.0)
MCHC: 30.4 g/dL (ref 30.0–36.0)
MCV: 75.2 fL — ABNORMAL LOW (ref 78.0–100.0)
MPV: 10.2 fL (ref 8.6–12.4)
Monocytes Absolute: 0.4 10*3/uL (ref 0.1–1.0)
Monocytes Relative: 5 % (ref 3–12)
Neutro Abs: 5.8 10*3/uL (ref 1.7–7.7)
Neutrophils Relative %: 65 % (ref 43–77)
PLATELETS: 508 10*3/uL — AB (ref 150–400)
RBC: 5.68 MIL/uL (ref 4.22–5.81)
RDW: 18.7 % — AB (ref 11.5–15.5)
WBC: 8.9 10*3/uL (ref 4.0–10.5)

## 2015-03-31 LAB — IRON AND TIBC
%SAT: 11 % — ABNORMAL LOW (ref 15–60)
IRON: 46 ug/dL — AB (ref 50–180)
TIBC: 409 ug/dL (ref 250–425)
UIBC: 363 ug/dL (ref 125–400)

## 2015-03-31 NOTE — Telephone Encounter (Signed)
Pt when in today for the breath test but it was not able to do it because he had taken some tums last night. Per [place] at the lab they don't want to do it since it will not be correct. Pt is aware and will come back for the test.

## 2015-03-31 NOTE — Telephone Encounter (Signed)
Noted  

## 2015-04-01 ENCOUNTER — Telehealth: Payer: Self-pay | Admitting: Gastroenterology

## 2015-04-01 LAB — FERRITIN: Ferritin: 16 ng/mL — ABNORMAL LOW (ref 20–380)

## 2015-04-01 NOTE — Telephone Encounter (Signed)
Patient COULD NOT TAKE BREATHING TEST BECAUSE HE TOOK A TUMS, HIS WIFE NEEDS TO KNOW EXACTLY WHAT HE CAN TAKE AND WHAT HE CANT.   [phone]

## 2015-04-01 NOTE — Telephone Encounter (Signed)
Talked with Doris(wife) and explained to her what he can not take. She understood.

## 2015-04-11 NOTE — Progress Notes (Signed)
Quick Note:  Persistent low iron but Hgb better. +eosinophilia.  Await h. Pylori breath test ______

## 2015-04-12 NOTE — Progress Notes (Signed)
Quick Note:  Pt is aware of results. He asked if he needs to be on iron and if so , how much? He is scheduled for the H Pylori breath test on Friday. ______

## 2015-04-15 ENCOUNTER — Other Ambulatory Visit: Payer: Self-pay | Admitting: Gastroenterology

## 2015-04-15 DIAGNOSIS — B9681 Helicobacter pylori [H. pylori] as the cause of diseases classified elsewhere: Secondary | ICD-10-CM | POA: Diagnosis not present

## 2015-04-15 DIAGNOSIS — D649 Anemia, unspecified: Secondary | ICD-10-CM | POA: Diagnosis not present

## 2015-04-16 ENCOUNTER — Telehealth: Payer: Self-pay | Admitting: Gastroenterology

## 2015-04-16 LAB — H. PYLORI BREATH TEST: H. pylori Breath Test: NOT DETECTED

## 2015-04-19 NOTE — Progress Notes (Signed)
Quick Note:  Please advise if pt needs to be taking iron. ______

## 2015-04-21 ENCOUNTER — Other Ambulatory Visit: Payer: Self-pay

## 2015-04-21 DIAGNOSIS — D649 Anemia, unspecified: Secondary | ICD-10-CM

## 2015-04-21 NOTE — Progress Notes (Signed)
Quick Note:  Please let patient know his H.pylori breath test is NEGATIVE! He needs to take ferrous sulfate 325mg  BID to build up iron stores.  We will recheck his CBC, iron/tibc, ferritin in 4 months. ______

## 2015-04-21 NOTE — Progress Notes (Signed)
Quick Note:  Pt and his wife are aware. She wrote down the name of the ferrous sulfate to get and take bid.  Lab orders on file for 08/20/2015. ______

## 2015-04-21 NOTE — Progress Notes (Signed)
Quick Note:  See H.pylori stool antigen result note. ______

## 2015-04-21 NOTE — Progress Notes (Signed)
Quick Note:    Noted    ______

## 2015-05-04 ENCOUNTER — Ambulatory Visit: Payer: Medicare Other | Admitting: Nurse Practitioner

## 2015-05-05 DIAGNOSIS — L259 Unspecified contact dermatitis, unspecified cause: Secondary | ICD-10-CM | POA: Diagnosis not present

## 2015-05-20 DIAGNOSIS — H2513 Age-related nuclear cataract, bilateral: Secondary | ICD-10-CM | POA: Diagnosis not present

## 2015-05-20 DIAGNOSIS — H04123 Dry eye syndrome of bilateral lacrimal glands: Secondary | ICD-10-CM | POA: Diagnosis not present

## 2015-05-20 DIAGNOSIS — H353131 Nonexudative age-related macular degeneration, bilateral, early dry stage: Secondary | ICD-10-CM | POA: Diagnosis not present

## 2015-05-24 ENCOUNTER — Encounter: Payer: Self-pay | Admitting: Nurse Practitioner

## 2015-05-24 ENCOUNTER — Telehealth: Payer: Self-pay | Admitting: Nurse Practitioner

## 2015-05-24 ENCOUNTER — Ambulatory Visit: Payer: Medicare Other | Admitting: Nurse Practitioner

## 2015-05-24 DIAGNOSIS — L309 Dermatitis, unspecified: Secondary | ICD-10-CM | POA: Diagnosis not present

## 2015-05-24 NOTE — Telephone Encounter (Signed)
Noted  

## 2015-05-24 NOTE — Telephone Encounter (Signed)
PATIENT WAS A NO SHOW AND LETTER SENT  °

## 2015-05-31 ENCOUNTER — Encounter: Payer: Self-pay | Admitting: Nurse Practitioner

## 2015-05-31 ENCOUNTER — Ambulatory Visit: Payer: Medicare Other | Admitting: Nurse Practitioner

## 2015-05-31 ENCOUNTER — Telehealth: Payer: Self-pay | Admitting: Nurse Practitioner

## 2015-05-31 NOTE — Telephone Encounter (Signed)
Noted  

## 2015-05-31 NOTE — Telephone Encounter (Signed)
PT WAS A NO SHOW AND LETTER SENT  °

## 2015-06-22 DIAGNOSIS — J45909 Unspecified asthma, uncomplicated: Secondary | ICD-10-CM | POA: Diagnosis not present

## 2015-06-22 DIAGNOSIS — I10 Essential (primary) hypertension: Secondary | ICD-10-CM | POA: Diagnosis not present

## 2015-07-26 ENCOUNTER — Other Ambulatory Visit: Payer: Self-pay

## 2015-07-26 DIAGNOSIS — D649 Anemia, unspecified: Secondary | ICD-10-CM

## 2015-10-19 DIAGNOSIS — L280 Lichen simplex chronicus: Secondary | ICD-10-CM | POA: Diagnosis not present

## 2015-10-19 DIAGNOSIS — L570 Actinic keratosis: Secondary | ICD-10-CM | POA: Diagnosis not present

## 2015-11-03 ENCOUNTER — Ambulatory Visit (INDEPENDENT_AMBULATORY_CARE_PROVIDER_SITE_OTHER): Payer: Medicare Other | Admitting: Orthopaedic Surgery

## 2015-11-03 DIAGNOSIS — M25461 Effusion, right knee: Secondary | ICD-10-CM | POA: Diagnosis not present

## 2015-11-03 DIAGNOSIS — M25561 Pain in right knee: Secondary | ICD-10-CM | POA: Diagnosis not present

## 2015-11-04 ENCOUNTER — Ambulatory Visit (INDEPENDENT_AMBULATORY_CARE_PROVIDER_SITE_OTHER): Payer: Medicare Other | Admitting: Orthopaedic Surgery

## 2015-11-04 DIAGNOSIS — M25561 Pain in right knee: Secondary | ICD-10-CM | POA: Diagnosis not present

## 2015-11-09 ENCOUNTER — Encounter (INDEPENDENT_AMBULATORY_CARE_PROVIDER_SITE_OTHER): Payer: Self-pay

## 2015-11-10 ENCOUNTER — Encounter (INDEPENDENT_AMBULATORY_CARE_PROVIDER_SITE_OTHER): Payer: Self-pay

## 2015-11-10 LAB — AEROBIC CULTURE

## 2015-11-19 DIAGNOSIS — Z23 Encounter for immunization: Secondary | ICD-10-CM | POA: Diagnosis not present

## 2015-11-19 DIAGNOSIS — R358 Other polyuria: Secondary | ICD-10-CM | POA: Diagnosis not present

## 2015-11-19 DIAGNOSIS — I10 Essential (primary) hypertension: Secondary | ICD-10-CM | POA: Diagnosis not present

## 2015-11-19 DIAGNOSIS — J449 Chronic obstructive pulmonary disease, unspecified: Secondary | ICD-10-CM | POA: Diagnosis not present

## 2015-11-19 DIAGNOSIS — F3342 Major depressive disorder, recurrent, in full remission: Secondary | ICD-10-CM | POA: Diagnosis not present

## 2015-11-19 DIAGNOSIS — K219 Gastro-esophageal reflux disease without esophagitis: Secondary | ICD-10-CM | POA: Diagnosis not present

## 2015-11-19 DIAGNOSIS — R7309 Other abnormal glucose: Secondary | ICD-10-CM | POA: Diagnosis not present

## 2015-11-19 DIAGNOSIS — E785 Hyperlipidemia, unspecified: Secondary | ICD-10-CM | POA: Diagnosis not present

## 2015-11-19 DIAGNOSIS — R350 Frequency of micturition: Secondary | ICD-10-CM | POA: Diagnosis not present

## 2015-12-03 DIAGNOSIS — Z1211 Encounter for screening for malignant neoplasm of colon: Secondary | ICD-10-CM | POA: Diagnosis not present

## 2015-12-03 DIAGNOSIS — R799 Abnormal finding of blood chemistry, unspecified: Secondary | ICD-10-CM | POA: Diagnosis not present

## 2015-12-22 DIAGNOSIS — J45909 Unspecified asthma, uncomplicated: Secondary | ICD-10-CM | POA: Diagnosis not present

## 2015-12-22 DIAGNOSIS — I10 Essential (primary) hypertension: Secondary | ICD-10-CM | POA: Diagnosis not present

## 2015-12-22 DIAGNOSIS — E6609 Other obesity due to excess calories: Secondary | ICD-10-CM | POA: Diagnosis not present

## 2016-03-16 DIAGNOSIS — I10 Essential (primary) hypertension: Secondary | ICD-10-CM | POA: Diagnosis not present

## 2016-03-16 DIAGNOSIS — F3342 Major depressive disorder, recurrent, in full remission: Secondary | ICD-10-CM | POA: Diagnosis not present

## 2016-03-16 DIAGNOSIS — Z1159 Encounter for screening for other viral diseases: Secondary | ICD-10-CM | POA: Diagnosis not present

## 2016-03-16 DIAGNOSIS — R350 Frequency of micturition: Secondary | ICD-10-CM | POA: Diagnosis not present

## 2016-03-16 DIAGNOSIS — E785 Hyperlipidemia, unspecified: Secondary | ICD-10-CM | POA: Diagnosis not present

## 2016-03-16 DIAGNOSIS — D649 Anemia, unspecified: Secondary | ICD-10-CM | POA: Diagnosis not present

## 2016-03-16 DIAGNOSIS — J0100 Acute maxillary sinusitis, unspecified: Secondary | ICD-10-CM | POA: Diagnosis not present

## 2016-03-16 DIAGNOSIS — K219 Gastro-esophageal reflux disease without esophagitis: Secondary | ICD-10-CM | POA: Diagnosis not present

## 2016-03-16 DIAGNOSIS — J449 Chronic obstructive pulmonary disease, unspecified: Secondary | ICD-10-CM | POA: Diagnosis not present

## 2016-03-20 DIAGNOSIS — Z79899 Other long term (current) drug therapy: Secondary | ICD-10-CM | POA: Diagnosis not present

## 2016-03-20 DIAGNOSIS — Z1589 Genetic susceptibility to other disease: Secondary | ICD-10-CM | POA: Diagnosis not present

## 2016-03-20 DIAGNOSIS — D7581 Myelofibrosis: Secondary | ICD-10-CM | POA: Diagnosis not present

## 2016-03-20 DIAGNOSIS — Z87891 Personal history of nicotine dependence: Secondary | ICD-10-CM | POA: Diagnosis not present

## 2016-03-20 DIAGNOSIS — R161 Splenomegaly, not elsewhere classified: Secondary | ICD-10-CM | POA: Diagnosis not present

## 2016-03-20 DIAGNOSIS — D649 Anemia, unspecified: Secondary | ICD-10-CM | POA: Diagnosis not present

## 2016-03-20 DIAGNOSIS — D471 Chronic myeloproliferative disease: Secondary | ICD-10-CM | POA: Diagnosis not present

## 2016-03-20 DIAGNOSIS — D72829 Elevated white blood cell count, unspecified: Secondary | ICD-10-CM | POA: Diagnosis not present

## 2016-03-27 DIAGNOSIS — Z87891 Personal history of nicotine dependence: Secondary | ICD-10-CM | POA: Diagnosis not present

## 2016-03-27 DIAGNOSIS — C9210 Chronic myeloid leukemia, BCR/ABL-positive, not having achieved remission: Secondary | ICD-10-CM | POA: Diagnosis not present

## 2016-03-27 DIAGNOSIS — D471 Chronic myeloproliferative disease: Secondary | ICD-10-CM | POA: Diagnosis not present

## 2016-03-27 DIAGNOSIS — D649 Anemia, unspecified: Secondary | ICD-10-CM | POA: Diagnosis not present

## 2016-03-27 DIAGNOSIS — Z1589 Genetic susceptibility to other disease: Secondary | ICD-10-CM | POA: Diagnosis not present

## 2016-03-27 DIAGNOSIS — D72829 Elevated white blood cell count, unspecified: Secondary | ICD-10-CM | POA: Diagnosis not present

## 2016-03-27 DIAGNOSIS — R161 Splenomegaly, not elsewhere classified: Secondary | ICD-10-CM | POA: Diagnosis not present

## 2016-03-27 DIAGNOSIS — D7581 Myelofibrosis: Secondary | ICD-10-CM | POA: Diagnosis not present

## 2016-03-27 DIAGNOSIS — Z79899 Other long term (current) drug therapy: Secondary | ICD-10-CM | POA: Diagnosis not present

## 2016-03-30 DIAGNOSIS — D72829 Elevated white blood cell count, unspecified: Secondary | ICD-10-CM | POA: Diagnosis not present

## 2016-04-01 DIAGNOSIS — Z79899 Other long term (current) drug therapy: Secondary | ICD-10-CM | POA: Diagnosis not present

## 2016-04-01 DIAGNOSIS — F329 Major depressive disorder, single episode, unspecified: Secondary | ICD-10-CM | POA: Diagnosis not present

## 2016-04-01 DIAGNOSIS — J90 Pleural effusion, not elsewhere classified: Secondary | ICD-10-CM | POA: Diagnosis not present

## 2016-04-01 DIAGNOSIS — R079 Chest pain, unspecified: Secondary | ICD-10-CM | POA: Diagnosis not present

## 2016-04-01 DIAGNOSIS — I10 Essential (primary) hypertension: Secondary | ICD-10-CM | POA: Diagnosis not present

## 2016-04-01 DIAGNOSIS — J449 Chronic obstructive pulmonary disease, unspecified: Secondary | ICD-10-CM | POA: Diagnosis not present

## 2016-04-01 DIAGNOSIS — Z7982 Long term (current) use of aspirin: Secondary | ICD-10-CM | POA: Diagnosis not present

## 2016-04-01 DIAGNOSIS — R161 Splenomegaly, not elsewhere classified: Secondary | ICD-10-CM | POA: Diagnosis not present

## 2016-04-01 DIAGNOSIS — K5790 Diverticulosis of intestine, part unspecified, without perforation or abscess without bleeding: Secondary | ICD-10-CM | POA: Diagnosis not present

## 2016-04-01 DIAGNOSIS — Z885 Allergy status to narcotic agent status: Secondary | ICD-10-CM | POA: Diagnosis not present

## 2016-04-01 DIAGNOSIS — E785 Hyperlipidemia, unspecified: Secondary | ICD-10-CM | POA: Diagnosis not present

## 2016-04-01 DIAGNOSIS — E669 Obesity, unspecified: Secondary | ICD-10-CM | POA: Diagnosis not present

## 2016-04-01 DIAGNOSIS — R591 Generalized enlarged lymph nodes: Secondary | ICD-10-CM | POA: Diagnosis not present

## 2016-04-01 DIAGNOSIS — D72829 Elevated white blood cell count, unspecified: Secondary | ICD-10-CM | POA: Diagnosis not present

## 2016-04-01 DIAGNOSIS — F17220 Nicotine dependence, chewing tobacco, uncomplicated: Secondary | ICD-10-CM | POA: Diagnosis not present

## 2016-04-03 DIAGNOSIS — Z1589 Genetic susceptibility to other disease: Secondary | ICD-10-CM | POA: Diagnosis not present

## 2016-04-03 DIAGNOSIS — R161 Splenomegaly, not elsewhere classified: Secondary | ICD-10-CM | POA: Diagnosis not present

## 2016-04-03 DIAGNOSIS — D7581 Myelofibrosis: Secondary | ICD-10-CM | POA: Diagnosis not present

## 2016-04-03 DIAGNOSIS — Z87891 Personal history of nicotine dependence: Secondary | ICD-10-CM | POA: Diagnosis not present

## 2016-04-03 DIAGNOSIS — L821 Other seborrheic keratosis: Secondary | ICD-10-CM | POA: Diagnosis not present

## 2016-04-03 DIAGNOSIS — D485 Neoplasm of uncertain behavior of skin: Secondary | ICD-10-CM | POA: Diagnosis not present

## 2016-04-03 DIAGNOSIS — D471 Chronic myeloproliferative disease: Secondary | ICD-10-CM | POA: Diagnosis not present

## 2016-04-03 DIAGNOSIS — D649 Anemia, unspecified: Secondary | ICD-10-CM | POA: Diagnosis not present

## 2016-04-03 DIAGNOSIS — L570 Actinic keratosis: Secondary | ICD-10-CM | POA: Diagnosis not present

## 2016-04-03 DIAGNOSIS — D72829 Elevated white blood cell count, unspecified: Secondary | ICD-10-CM | POA: Diagnosis not present

## 2016-04-03 DIAGNOSIS — Z79899 Other long term (current) drug therapy: Secondary | ICD-10-CM | POA: Diagnosis not present

## 2016-04-03 DIAGNOSIS — L280 Lichen simplex chronicus: Secondary | ICD-10-CM | POA: Diagnosis not present

## 2016-04-11 DIAGNOSIS — D72829 Elevated white blood cell count, unspecified: Secondary | ICD-10-CM | POA: Diagnosis not present

## 2016-04-11 DIAGNOSIS — Z79899 Other long term (current) drug therapy: Secondary | ICD-10-CM | POA: Diagnosis not present

## 2016-04-11 DIAGNOSIS — Z87891 Personal history of nicotine dependence: Secondary | ICD-10-CM | POA: Diagnosis not present

## 2016-04-11 DIAGNOSIS — Z1589 Genetic susceptibility to other disease: Secondary | ICD-10-CM | POA: Diagnosis not present

## 2016-04-11 DIAGNOSIS — R161 Splenomegaly, not elsewhere classified: Secondary | ICD-10-CM | POA: Diagnosis not present

## 2016-04-11 DIAGNOSIS — D7581 Myelofibrosis: Secondary | ICD-10-CM | POA: Diagnosis not present

## 2016-04-11 DIAGNOSIS — D649 Anemia, unspecified: Secondary | ICD-10-CM | POA: Diagnosis not present

## 2016-04-11 DIAGNOSIS — D471 Chronic myeloproliferative disease: Secondary | ICD-10-CM | POA: Diagnosis not present

## 2016-04-12 DIAGNOSIS — Z79899 Other long term (current) drug therapy: Secondary | ICD-10-CM | POA: Diagnosis not present

## 2016-04-12 DIAGNOSIS — D649 Anemia, unspecified: Secondary | ICD-10-CM | POA: Diagnosis not present

## 2016-04-12 DIAGNOSIS — R161 Splenomegaly, not elsewhere classified: Secondary | ICD-10-CM | POA: Diagnosis not present

## 2016-04-12 DIAGNOSIS — D72829 Elevated white blood cell count, unspecified: Secondary | ICD-10-CM | POA: Diagnosis not present

## 2016-04-13 DIAGNOSIS — E785 Hyperlipidemia, unspecified: Secondary | ICD-10-CM | POA: Diagnosis not present

## 2016-04-13 DIAGNOSIS — Z79899 Other long term (current) drug therapy: Secondary | ICD-10-CM | POA: Diagnosis not present

## 2016-04-13 DIAGNOSIS — Z7951 Long term (current) use of inhaled steroids: Secondary | ICD-10-CM | POA: Diagnosis not present

## 2016-04-13 DIAGNOSIS — D61818 Other pancytopenia: Secondary | ICD-10-CM | POA: Diagnosis not present

## 2016-04-13 DIAGNOSIS — J449 Chronic obstructive pulmonary disease, unspecified: Secondary | ICD-10-CM | POA: Diagnosis not present

## 2016-04-13 DIAGNOSIS — R161 Splenomegaly, not elsewhere classified: Secondary | ICD-10-CM | POA: Diagnosis not present

## 2016-04-13 DIAGNOSIS — D471 Chronic myeloproliferative disease: Secondary | ICD-10-CM | POA: Diagnosis not present

## 2016-04-18 DIAGNOSIS — D759 Disease of blood and blood-forming organs, unspecified: Secondary | ICD-10-CM | POA: Diagnosis not present

## 2016-04-18 DIAGNOSIS — D471 Chronic myeloproliferative disease: Secondary | ICD-10-CM | POA: Diagnosis not present

## 2016-04-25 DIAGNOSIS — Z1589 Genetic susceptibility to other disease: Secondary | ICD-10-CM | POA: Diagnosis not present

## 2016-04-25 DIAGNOSIS — D7581 Myelofibrosis: Secondary | ICD-10-CM | POA: Diagnosis not present

## 2016-04-25 DIAGNOSIS — Z1509 Genetic susceptibility to other malignant neoplasm: Secondary | ICD-10-CM | POA: Diagnosis not present

## 2016-04-25 DIAGNOSIS — E790 Hyperuricemia without signs of inflammatory arthritis and tophaceous disease: Secondary | ICD-10-CM | POA: Diagnosis not present

## 2016-05-15 DIAGNOSIS — Z01818 Encounter for other preprocedural examination: Secondary | ICD-10-CM | POA: Diagnosis not present

## 2016-05-15 DIAGNOSIS — F17200 Nicotine dependence, unspecified, uncomplicated: Secondary | ICD-10-CM | POA: Diagnosis not present

## 2016-05-15 DIAGNOSIS — D72828 Other elevated white blood cell count: Secondary | ICD-10-CM | POA: Diagnosis not present

## 2016-05-15 DIAGNOSIS — J449 Chronic obstructive pulmonary disease, unspecified: Secondary | ICD-10-CM | POA: Diagnosis not present

## 2016-05-15 DIAGNOSIS — Z79899 Other long term (current) drug therapy: Secondary | ICD-10-CM | POA: Diagnosis not present

## 2016-05-15 DIAGNOSIS — Z885 Allergy status to narcotic agent status: Secondary | ICD-10-CM | POA: Diagnosis not present

## 2016-05-15 DIAGNOSIS — E785 Hyperlipidemia, unspecified: Secondary | ICD-10-CM | POA: Diagnosis not present

## 2016-05-15 DIAGNOSIS — D471 Chronic myeloproliferative disease: Secondary | ICD-10-CM | POA: Diagnosis not present

## 2016-05-15 DIAGNOSIS — Z9221 Personal history of antineoplastic chemotherapy: Secondary | ICD-10-CM | POA: Diagnosis not present

## 2016-05-15 DIAGNOSIS — Z7189 Other specified counseling: Secondary | ICD-10-CM | POA: Diagnosis not present

## 2016-05-16 DIAGNOSIS — E790 Hyperuricemia without signs of inflammatory arthritis and tophaceous disease: Secondary | ICD-10-CM | POA: Diagnosis not present

## 2016-05-16 DIAGNOSIS — D7581 Myelofibrosis: Secondary | ICD-10-CM | POA: Diagnosis not present

## 2016-05-30 DIAGNOSIS — E790 Hyperuricemia without signs of inflammatory arthritis and tophaceous disease: Secondary | ICD-10-CM | POA: Diagnosis not present

## 2016-05-30 DIAGNOSIS — Z1589 Genetic susceptibility to other disease: Secondary | ICD-10-CM | POA: Diagnosis not present

## 2016-05-30 DIAGNOSIS — D7581 Myelofibrosis: Secondary | ICD-10-CM | POA: Diagnosis not present

## 2016-06-13 DIAGNOSIS — E790 Hyperuricemia without signs of inflammatory arthritis and tophaceous disease: Secondary | ICD-10-CM | POA: Diagnosis not present

## 2016-06-13 DIAGNOSIS — D7581 Myelofibrosis: Secondary | ICD-10-CM | POA: Diagnosis not present

## 2016-06-27 DIAGNOSIS — E790 Hyperuricemia without signs of inflammatory arthritis and tophaceous disease: Secondary | ICD-10-CM | POA: Diagnosis not present

## 2016-06-27 DIAGNOSIS — D7581 Myelofibrosis: Secondary | ICD-10-CM | POA: Diagnosis not present

## 2016-07-11 DIAGNOSIS — E790 Hyperuricemia without signs of inflammatory arthritis and tophaceous disease: Secondary | ICD-10-CM | POA: Diagnosis not present

## 2016-07-11 DIAGNOSIS — D7581 Myelofibrosis: Secondary | ICD-10-CM | POA: Diagnosis not present

## 2016-07-17 DIAGNOSIS — D72829 Elevated white blood cell count, unspecified: Secondary | ICD-10-CM | POA: Diagnosis not present

## 2016-07-17 DIAGNOSIS — E790 Hyperuricemia without signs of inflammatory arthritis and tophaceous disease: Secondary | ICD-10-CM | POA: Diagnosis not present

## 2016-07-17 DIAGNOSIS — D7581 Myelofibrosis: Secondary | ICD-10-CM | POA: Diagnosis not present

## 2016-08-10 DIAGNOSIS — D7581 Myelofibrosis: Secondary | ICD-10-CM | POA: Diagnosis not present

## 2016-09-11 DIAGNOSIS — Z1589 Genetic susceptibility to other disease: Secondary | ICD-10-CM | POA: Diagnosis not present

## 2016-09-11 DIAGNOSIS — E790 Hyperuricemia without signs of inflammatory arthritis and tophaceous disease: Secondary | ICD-10-CM | POA: Diagnosis not present

## 2016-09-11 DIAGNOSIS — D7581 Myelofibrosis: Secondary | ICD-10-CM | POA: Diagnosis not present

## 2016-10-03 DIAGNOSIS — L299 Pruritus, unspecified: Secondary | ICD-10-CM | POA: Diagnosis not present

## 2016-10-03 DIAGNOSIS — L570 Actinic keratosis: Secondary | ICD-10-CM | POA: Diagnosis not present

## 2016-10-09 DIAGNOSIS — D7581 Myelofibrosis: Secondary | ICD-10-CM | POA: Diagnosis not present

## 2016-10-09 DIAGNOSIS — E790 Hyperuricemia without signs of inflammatory arthritis and tophaceous disease: Secondary | ICD-10-CM | POA: Diagnosis not present

## 2016-10-31 DIAGNOSIS — Z23 Encounter for immunization: Secondary | ICD-10-CM | POA: Diagnosis not present

## 2016-11-14 DIAGNOSIS — N183 Chronic kidney disease, stage 3 (moderate): Secondary | ICD-10-CM | POA: Diagnosis not present

## 2016-11-14 DIAGNOSIS — Z1589 Genetic susceptibility to other disease: Secondary | ICD-10-CM | POA: Diagnosis not present

## 2016-11-14 DIAGNOSIS — D7581 Myelofibrosis: Secondary | ICD-10-CM | POA: Diagnosis not present

## 2016-11-14 DIAGNOSIS — E790 Hyperuricemia without signs of inflammatory arthritis and tophaceous disease: Secondary | ICD-10-CM | POA: Diagnosis not present

## 2016-12-13 DIAGNOSIS — D7581 Myelofibrosis: Secondary | ICD-10-CM | POA: Diagnosis not present

## 2016-12-13 DIAGNOSIS — N183 Chronic kidney disease, stage 3 (moderate): Secondary | ICD-10-CM | POA: Diagnosis not present

## 2017-01-10 DIAGNOSIS — D7581 Myelofibrosis: Secondary | ICD-10-CM | POA: Diagnosis not present

## 2017-01-12 DIAGNOSIS — I10 Essential (primary) hypertension: Secondary | ICD-10-CM | POA: Diagnosis not present

## 2017-01-12 DIAGNOSIS — J209 Acute bronchitis, unspecified: Secondary | ICD-10-CM | POA: Diagnosis not present

## 2017-01-12 DIAGNOSIS — J441 Chronic obstructive pulmonary disease with (acute) exacerbation: Secondary | ICD-10-CM | POA: Diagnosis not present

## 2017-10-19 ENCOUNTER — Encounter (HOSPITAL_COMMUNITY): Payer: Self-pay

## 2017-10-19 ENCOUNTER — Emergency Department (HOSPITAL_COMMUNITY): Payer: Medicare Other

## 2017-10-19 ENCOUNTER — Inpatient Hospital Stay (HOSPITAL_COMMUNITY)
Admission: EM | Admit: 2017-10-19 | Discharge: 2017-10-24 | DRG: 871 | Disposition: A | Discharge status: Home / Self Care | Payer: Medicare Other | Attending: Internal Medicine | Admitting: Internal Medicine

## 2017-10-19 ENCOUNTER — Other Ambulatory Visit: Payer: Self-pay | Admitting: Hematology

## 2017-10-19 DIAGNOSIS — D638 Anemia in other chronic diseases classified elsewhere: Secondary | ICD-10-CM | POA: Diagnosis not present

## 2017-10-19 DIAGNOSIS — Z806 Family history of leukemia: Secondary | ICD-10-CM

## 2017-10-19 DIAGNOSIS — D649 Anemia, unspecified: Secondary | ICD-10-CM | POA: Diagnosis not present

## 2017-10-19 DIAGNOSIS — Z79899 Other long term (current) drug therapy: Secondary | ICD-10-CM

## 2017-10-19 DIAGNOSIS — E785 Hyperlipidemia, unspecified: Secondary | ICD-10-CM | POA: Diagnosis present

## 2017-10-19 DIAGNOSIS — Z87891 Personal history of nicotine dependence: Secondary | ICD-10-CM

## 2017-10-19 DIAGNOSIS — J189 Pneumonia, unspecified organism: Secondary | ICD-10-CM | POA: Diagnosis present

## 2017-10-19 DIAGNOSIS — T380X5A Adverse effect of glucocorticoids and synthetic analogues, initial encounter: Secondary | ICD-10-CM | POA: Diagnosis present

## 2017-10-19 DIAGNOSIS — A419 Sepsis, unspecified organism: Principal | ICD-10-CM | POA: Diagnosis present

## 2017-10-19 DIAGNOSIS — I1310 Hypertensive heart and chronic kidney disease without heart failure, with stage 1 through stage 4 chronic kidney disease, or unspecified chronic kidney disease: Secondary | ICD-10-CM | POA: Diagnosis present

## 2017-10-19 DIAGNOSIS — F329 Major depressive disorder, single episode, unspecified: Secondary | ICD-10-CM | POA: Diagnosis not present

## 2017-10-19 DIAGNOSIS — Z9889 Other specified postprocedural states: Secondary | ICD-10-CM

## 2017-10-19 DIAGNOSIS — N183 Chronic kidney disease, stage 3 unspecified: Secondary | ICD-10-CM | POA: Diagnosis present

## 2017-10-19 DIAGNOSIS — K760 Fatty (change of) liver, not elsewhere classified: Secondary | ICD-10-CM | POA: Diagnosis present

## 2017-10-19 DIAGNOSIS — M109 Gout, unspecified: Secondary | ICD-10-CM | POA: Diagnosis present

## 2017-10-19 DIAGNOSIS — Z825 Family history of asthma and other chronic lower respiratory diseases: Secondary | ICD-10-CM

## 2017-10-19 DIAGNOSIS — J440 Chronic obstructive pulmonary disease with acute lower respiratory infection: Secondary | ICD-10-CM | POA: Diagnosis present

## 2017-10-19 DIAGNOSIS — I21A1 Myocardial infarction type 2: Secondary | ICD-10-CM | POA: Diagnosis present

## 2017-10-19 DIAGNOSIS — Z880 Allergy status to penicillin: Secondary | ICD-10-CM

## 2017-10-19 DIAGNOSIS — D709 Neutropenia, unspecified: Secondary | ICD-10-CM

## 2017-10-19 DIAGNOSIS — I208 Other forms of angina pectoris: Secondary | ICD-10-CM | POA: Diagnosis not present

## 2017-10-19 DIAGNOSIS — F419 Anxiety disorder, unspecified: Secondary | ICD-10-CM | POA: Diagnosis present

## 2017-10-19 DIAGNOSIS — T451X5A Adverse effect of antineoplastic and immunosuppressive drugs, initial encounter: Secondary | ICD-10-CM | POA: Diagnosis not present

## 2017-10-19 DIAGNOSIS — I214 Non-ST elevation (NSTEMI) myocardial infarction: Secondary | ICD-10-CM | POA: Diagnosis not present

## 2017-10-19 DIAGNOSIS — N400 Enlarged prostate without lower urinary tract symptoms: Secondary | ICD-10-CM | POA: Diagnosis present

## 2017-10-19 DIAGNOSIS — R9431 Abnormal electrocardiogram [ECG] [EKG]: Secondary | ICD-10-CM

## 2017-10-19 DIAGNOSIS — R079 Chest pain, unspecified: Secondary | ICD-10-CM

## 2017-10-19 DIAGNOSIS — R0789 Other chest pain: Secondary | ICD-10-CM | POA: Diagnosis not present

## 2017-10-19 DIAGNOSIS — D7581 Myelofibrosis: Secondary | ICD-10-CM | POA: Diagnosis present

## 2017-10-19 DIAGNOSIS — R5081 Fever presenting with conditions classified elsewhere: Secondary | ICD-10-CM

## 2017-10-19 DIAGNOSIS — M79601 Pain in right arm: Secondary | ICD-10-CM | POA: Diagnosis present

## 2017-10-19 DIAGNOSIS — R161 Splenomegaly, not elsewhere classified: Secondary | ICD-10-CM | POA: Diagnosis not present

## 2017-10-19 DIAGNOSIS — D61818 Other pancytopenia: Secondary | ICD-10-CM | POA: Diagnosis not present

## 2017-10-19 DIAGNOSIS — K219 Gastro-esophageal reflux disease without esophagitis: Secondary | ICD-10-CM | POA: Diagnosis not present

## 2017-10-19 DIAGNOSIS — Z6841 Body Mass Index (BMI) 40.0 and over, adult: Secondary | ICD-10-CM

## 2017-10-19 DIAGNOSIS — G4733 Obstructive sleep apnea (adult) (pediatric): Secondary | ICD-10-CM | POA: Diagnosis present

## 2017-10-19 DIAGNOSIS — I129 Hypertensive chronic kidney disease with stage 1 through stage 4 chronic kidney disease, or unspecified chronic kidney disease: Secondary | ICD-10-CM | POA: Diagnosis not present

## 2017-10-19 DIAGNOSIS — E538 Deficiency of other specified B group vitamins: Secondary | ICD-10-CM | POA: Diagnosis present

## 2017-10-19 DIAGNOSIS — D61810 Antineoplastic chemotherapy induced pancytopenia: Secondary | ICD-10-CM | POA: Diagnosis not present

## 2017-10-19 DIAGNOSIS — J441 Chronic obstructive pulmonary disease with (acute) exacerbation: Secondary | ICD-10-CM | POA: Diagnosis not present

## 2017-10-19 DIAGNOSIS — R05 Cough: Secondary | ICD-10-CM | POA: Diagnosis present

## 2017-10-19 DIAGNOSIS — Z7951 Long term (current) use of inhaled steroids: Secondary | ICD-10-CM

## 2017-10-19 DIAGNOSIS — E669 Obesity, unspecified: Secondary | ICD-10-CM | POA: Diagnosis not present

## 2017-10-19 DIAGNOSIS — Z23 Encounter for immunization: Secondary | ICD-10-CM

## 2017-10-19 DIAGNOSIS — R739 Hyperglycemia, unspecified: Secondary | ICD-10-CM | POA: Diagnosis present

## 2017-10-19 DIAGNOSIS — I493 Ventricular premature depolarization: Secondary | ICD-10-CM | POA: Diagnosis present

## 2017-10-19 DIAGNOSIS — Z885 Allergy status to narcotic agent status: Secondary | ICD-10-CM

## 2017-10-19 DIAGNOSIS — F418 Other specified anxiety disorders: Secondary | ICD-10-CM | POA: Diagnosis not present

## 2017-10-19 DIAGNOSIS — Z800 Family history of malignant neoplasm of digestive organs: Secondary | ICD-10-CM

## 2017-10-19 HISTORY — DX: Malignant (primary) neoplasm, unspecified: C80.1

## 2017-10-19 LAB — CBC WITH DIFFERENTIAL/PLATELET
BASOS PCT: 2 %
Basophils Absolute: 0 10*3/uL (ref 0.0–0.1)
EOS ABS: 0 10*3/uL (ref 0.0–0.7)
EOS PCT: 3 %
HEMATOCRIT: 19.3 % — AB (ref 39.0–52.0)
Hemoglobin: 6.4 g/dL — CL (ref 13.0–17.0)
Lymphocytes Relative: 54 %
Lymphs Abs: 0.8 10*3/uL (ref 0.7–4.0)
MCH: 31.7 pg (ref 26.0–34.0)
MCHC: 33.2 g/dL (ref 30.0–36.0)
MCV: 95.5 fL (ref 78.0–100.0)
MONO ABS: 0 10*3/uL (ref 0.1–1.0)
MONOS PCT: 2 %
Neutro Abs: 0.6 10*3/uL (ref 1.7–7.7)
Neutrophils Relative %: 39 %
PLATELETS: 45 10*3/uL — AB (ref 150–400)
RBC: 2.02 MIL/uL — ABNORMAL LOW (ref 4.22–5.81)
RDW: 18.2 % — AB (ref 11.5–15.5)
WBC: 1.5 10*3/uL — ABNORMAL LOW (ref 4.0–10.5)

## 2017-10-19 LAB — URINALYSIS, ROUTINE W REFLEX MICROSCOPIC
BILIRUBIN URINE: NEGATIVE
Glucose, UA: NEGATIVE mg/dL
Hgb urine dipstick: NEGATIVE
KETONES UR: NEGATIVE mg/dL
LEUKOCYTES UA: NEGATIVE
NITRITE: NEGATIVE
Protein, ur: NEGATIVE mg/dL
Specific Gravity, Urine: 1.018 (ref 1.005–1.030)
pH: 7 (ref 5.0–8.0)

## 2017-10-19 LAB — COMPREHENSIVE METABOLIC PANEL
ALK PHOS: 58 U/L (ref 38–126)
ALT: 22 U/L (ref 0–44)
AST: 21 U/L (ref 15–41)
Albumin: 4.2 g/dL (ref 3.5–5.0)
Anion gap: 7 (ref 5–15)
BUN: 17 mg/dL (ref 8–23)
CALCIUM: 8.3 mg/dL — AB (ref 8.9–10.3)
CO2: 23 mmol/L (ref 22–32)
CREATININE: 1.48 mg/dL — AB (ref 0.61–1.24)
Chloride: 107 mmol/L (ref 98–111)
GFR, EST AFRICAN AMERICAN: 54 mL/min — AB (ref >60)
GFR, EST NON AFRICAN AMERICAN: 46 mL/min — AB (ref >60)
Glucose, Bld: 105 mg/dL — ABNORMAL HIGH (ref 70–99)
Potassium: 4 mmol/L (ref 3.5–5.1)
Sodium: 137 mmol/L (ref 135–145)
TOTAL PROTEIN: 7 g/dL (ref 6.5–8.1)
Total Bilirubin: 1 mg/dL (ref 0.3–1.2)

## 2017-10-19 LAB — IRON AND TIBC
IRON: 83 ug/dL (ref 45–182)
SATURATION RATIOS: 24 % (ref 17.9–39.5)
TIBC: 341 ug/dL (ref 250–450)
UIBC: 258 ug/dL

## 2017-10-19 LAB — RETICULOCYTES
RBC.: 2 MIL/uL — ABNORMAL LOW (ref 4.22–5.81)
Retic Count, Absolute: 60 10*3/uL (ref 19.0–186.0)
Retic Ct Pct: 3 % (ref 0.4–3.1)

## 2017-10-19 LAB — FOLATE: Folate: 9.1 ng/mL (ref >5.9)

## 2017-10-19 LAB — I-STAT CG4 LACTIC ACID, ED
LACTIC ACID, VENOUS: 2.11 mmol/L — AB (ref 0.5–1.9)
Lactic Acid, Venous: 2.03 mmol/L (ref 0.5–1.9)

## 2017-10-19 LAB — I-STAT CHEM 8, ED
BUN: 20 mg/dL (ref 8–23)
CHLORIDE: 103 mmol/L (ref 98–111)
CREATININE: 1.6 mg/dL — AB (ref 0.61–1.24)
Calcium, Ion: 1.12 mmol/L — ABNORMAL LOW (ref 1.15–1.40)
GLUCOSE: 101 mg/dL — AB (ref 70–99)
HCT: 19 % — ABNORMAL LOW (ref 39.0–52.0)
Hemoglobin: 6.5 g/dL — CL (ref 13.0–17.0)
POTASSIUM: 4.2 mmol/L (ref 3.5–5.1)
Sodium: 137 mmol/L (ref 135–145)
TCO2: 23 mmol/L (ref 22–32)

## 2017-10-19 LAB — VITAMIN B12: Vitamin B-12: 150 pg/mL — ABNORMAL LOW (ref 180–914)

## 2017-10-19 LAB — TROPONIN I: Troponin I: <0.03 ng/mL (ref <0.03)

## 2017-10-19 LAB — FERRITIN: FERRITIN: 416 ng/mL — AB (ref 24–336)

## 2017-10-19 LAB — I-STAT TROPONIN, ED: TROPONIN I, POC: 0.03 ng/mL (ref 0.00–0.08)

## 2017-10-19 LAB — PREPARE RBC (CROSSMATCH)

## 2017-10-19 MED ORDER — ONDANSETRON HCL 4 MG PO TABS: 4.0000 mg | ORAL_TABLET | Freq: Four times a day (QID) | ORAL | Status: DC | PRN

## 2017-10-19 MED ORDER — ACETAMINOPHEN 325 MG PO TABS: 650.0000 mg | ORAL_TABLET | Freq: Four times a day (QID) | ORAL | Status: DC | PRN

## 2017-10-19 MED ORDER — SODIUM CHLORIDE 0.9 % IV SOLN
500.0000 mg | INTRAVENOUS | Status: DC
  Administered 2017-10-21 – 2017-10-23 (×4): 500 mg via INTRAVENOUS
  Filled 2017-10-19 (×5): qty 500

## 2017-10-19 MED ORDER — ACETAMINOPHEN 650 MG RE SUPP: 650.0000 mg | Freq: Four times a day (QID) | RECTAL | Status: DC | PRN

## 2017-10-19 MED ORDER — METHYLPREDNISOLONE SODIUM SUCC 40 MG IJ SOLR
40.0000 mg | Freq: Three times a day (TID) | INTRAMUSCULAR | Status: DC
  Administered 2017-10-20 (×2): 40 mg via INTRAVENOUS
  Filled 2017-10-19 (×3): qty 1

## 2017-10-19 MED ORDER — ONDANSETRON HCL 4 MG/2ML IJ SOLN: 4.0000 mg | Freq: Four times a day (QID) | INTRAMUSCULAR | Status: DC | PRN

## 2017-10-19 MED ORDER — SODIUM CHLORIDE 0.9 % IV SOLN
2.0000 g | INTRAVENOUS | Status: DC
  Administered 2017-10-21 – 2017-10-23 (×4): 2 g via INTRAVENOUS
  Filled 2017-10-19 (×4): qty 20

## 2017-10-19 MED ORDER — TAMSULOSIN HCL 0.4 MG PO CAPS
0.4000 mg | ORAL_CAPSULE | Freq: Two times a day (BID) | ORAL | Status: DC
  Administered 2017-10-20 – 2017-10-24 (×10): 0.4 mg via ORAL
  Filled 2017-10-19 (×10): qty 1

## 2017-10-19 MED ORDER — MUPIROCIN 2 % EX OINT
1.0000 "application " | TOPICAL_OINTMENT | Freq: Two times a day (BID) | CUTANEOUS | Status: DC
  Administered 2017-10-20 – 2017-10-24 (×10): 1 via TOPICAL
  Filled 2017-10-19 (×2): qty 22

## 2017-10-19 MED ORDER — PRAVASTATIN SODIUM 40 MG PO TABS
80.0000 mg | ORAL_TABLET | Freq: Every day | ORAL | Status: DC
  Administered 2017-10-20 – 2017-10-23 (×5): 80 mg via ORAL
  Filled 2017-10-19 (×8): qty 2

## 2017-10-19 MED ORDER — SODIUM CHLORIDE 0.9% FLUSH
3.0000 mL | Freq: Two times a day (BID) | INTRAVENOUS | Status: DC
  Administered 2017-10-20 – 2017-10-24 (×8): 3 mL via INTRAVENOUS

## 2017-10-19 MED ORDER — PREDNISONE 50 MG PO TABS
60.0000 mg | ORAL_TABLET | Freq: Once | ORAL | Status: AC
  Administered 2017-10-19: 60 mg via ORAL
  Filled 2017-10-19: qty 1

## 2017-10-19 MED ORDER — ALBUTEROL SULFATE (2.5 MG/3ML) 0.083% IN NEBU
2.5000 mg | INHALATION_SOLUTION | Freq: Four times a day (QID) | RESPIRATORY_TRACT | Status: DC | PRN
  Filled 2017-10-19: qty 3

## 2017-10-19 MED ORDER — IPRATROPIUM-ALBUTEROL 0.5-2.5 (3) MG/3ML IN SOLN
3.0000 mL | Freq: Once | RESPIRATORY_TRACT | Status: AC
  Administered 2017-10-19: 3 mL via RESPIRATORY_TRACT
  Filled 2017-10-19: qty 3

## 2017-10-19 MED ORDER — LACTATED RINGERS IV BOLUS
1000.0000 mL | Freq: Once | INTRAVENOUS | Status: AC
  Administered 2017-10-19: 1000 mL via INTRAVENOUS

## 2017-10-19 MED ORDER — MORPHINE SULFATE (PF) 2 MG/ML IV SOLN: 1.0000 mg | INTRAVENOUS | Status: DC | PRN

## 2017-10-19 MED ORDER — SENNOSIDES-DOCUSATE SODIUM 8.6-50 MG PO TABS: 1.0000 | ORAL_TABLET | Freq: Every evening | ORAL | Status: DC | PRN

## 2017-10-19 MED ORDER — CITALOPRAM HYDROBROMIDE 20 MG PO TABS
20.0000 mg | ORAL_TABLET | Freq: Every day | ORAL | Status: DC
  Administered 2017-10-20 – 2017-10-24 (×5): 20 mg via ORAL
  Filled 2017-10-19 (×8): qty 1

## 2017-10-19 MED ORDER — MOMETASONE FURO-FORMOTEROL FUM 200-5 MCG/ACT IN AERO
2.0000 | INHALATION_SPRAY | Freq: Two times a day (BID) | RESPIRATORY_TRACT | Status: DC
  Administered 2017-10-20 – 2017-10-24 (×9): 2 via RESPIRATORY_TRACT
  Filled 2017-10-19 (×2): qty 8.8

## 2017-10-19 MED ORDER — PANTOPRAZOLE SODIUM 40 MG PO TBEC
40.0000 mg | DELAYED_RELEASE_TABLET | Freq: Every day | ORAL | Status: DC
  Administered 2017-10-20 – 2017-10-24 (×5): 40 mg via ORAL
  Filled 2017-10-19 (×5): qty 1

## 2017-10-19 MED ORDER — SODIUM CHLORIDE 0.9 % IV SOLN
1.0000 g | Freq: Once | INTRAVENOUS | Status: AC
  Administered 2017-10-20: 1 g via INTRAVENOUS
  Filled 2017-10-19: qty 10

## 2017-10-19 MED ORDER — SODIUM CHLORIDE 0.9% IV SOLUTION: Freq: Once | INTRAVENOUS | Status: DC

## 2017-10-19 MED ORDER — SODIUM CHLORIDE 0.9 % IV SOLN
1.0000 g | Freq: Once | INTRAVENOUS | Status: AC
  Administered 2017-10-19: 1 g via INTRAVENOUS
  Filled 2017-10-19: qty 10

## 2017-10-19 MED ORDER — SODIUM CHLORIDE 0.9 % IV SOLN
500.0000 mg | Freq: Once | INTRAVENOUS | Status: AC
  Administered 2017-10-19: 500 mg via INTRAVENOUS
  Filled 2017-10-19: qty 500

## 2017-10-19 MED ORDER — ALLOPURINOL 300 MG PO TABS
300.0000 mg | ORAL_TABLET | Freq: Every day | ORAL | Status: DC
  Administered 2017-10-20 – 2017-10-24 (×5): 300 mg via ORAL
  Filled 2017-10-19 (×8): qty 1

## 2017-10-19 MED ORDER — SODIUM CHLORIDE 0.9 % IV SOLN
INTRAVENOUS | Status: DC
  Administered 2017-10-20: 02:00:00 via INTRAVENOUS

## 2017-10-19 MED ORDER — HYDROXYZINE HCL 25 MG PO TABS
25.0000 mg | ORAL_TABLET | Freq: Two times a day (BID) | ORAL | Status: DC
  Administered 2017-10-20 – 2017-10-24 (×10): 25 mg via ORAL
  Filled 2017-10-19 (×10): qty 1

## 2017-10-19 MED ORDER — UMECLIDINIUM BROMIDE 62.5 MCG/INH IN AEPB
1.0000 | INHALATION_SPRAY | Freq: Two times a day (BID) | RESPIRATORY_TRACT | Status: DC
  Administered 2017-10-20 – 2017-10-21 (×3): 1 via RESPIRATORY_TRACT
  Filled 2017-10-19: qty 7

## 2017-10-19 MED ORDER — ASPIRIN 81 MG PO CHEW
324.0000 mg | CHEWABLE_TABLET | Freq: Once | ORAL | Status: AC
  Administered 2017-10-19: 324 mg via ORAL
  Filled 2017-10-19: qty 4

## 2017-10-19 NOTE — ED Provider Notes (Signed)
Emergency Department Provider Note   I have reviewed the triage vital signs and the nursing notes.   HISTORY  Chief Complaint Cough and Chest Pain   HPI Bruce [name] is a 72 y.o. male with a history of COPD, myelofibrosis, asthma, gout and obesity who presents the emergency department today for multiple symptoms.  Sound like the patient started having chest pain on Wednesday.  He also started having cough and congestion just prior to that.  He has been in the bed basically for the last 3 days. Chest pain is burning in nature. Also with burning in hands. EMS arrived, patient in rigors, tachycardic, febrile to 101 with green sputum with productive cough. Put on oxygen. Iv started. ecg with ischemic changes.  No other associated or modifying symptoms.    Past Medical History:  Diagnosis Date  . Anemia 02/02/2011  . Asthma 12/01/2011   On symbicort 2 puffs BID  . Cancer ([place])    mylofibrosis  . COPD (chronic obstructive pulmonary disease) ([place])   . Degenerative joint disease   . Depression   . Eosinophilia   . Family history of anesthesia complication    brother had confusion after CABG  . GERD (gastroesophageal reflux disease)    takes Tums  . Gout   . Hip fracture, left [facility]) January 2013.   Status post ORIF  . Hyperlipidemia   . Laceration of finger, index    left  . Obesity   . Shortness of breath    with exertion  . Sinus disease   . Thrombocytopenia ([place])   . Wrist fracture, left     Patient Active Problem List   Diagnosis Date Noted  . Sepsis due to pneumonia ([place]) 10/19/2017  . Chest discomfort 10/19/2017  . Myelofibrosis ([place]) 10/19/2017  . Pancytopenia ([place]) 10/19/2017  . CKD (chronic kidney disease), stage III ([place]) 10/19/2017  . Abnormal ECG   . Community acquired pneumonia   . Normocytic anemia 03/10/2015  . Abdominal pain 12/03/2014  . Helicobacter pylori gastritis 06/25/2014  . Heme + stool   . Heme positive stool 01/05/2014  . Retained  orthopedic hardware 05/21/2013  . S/P hardware removal 05/20/2013  . Asthma 12/01/2011  . Closed left subtrochanteric femur fracture ([place]) 02/07/2011  . Wrist fracture, left 02/07/2011  . Bradycardia 02/05/2011  . Depression 02/04/2011  . Confusion 02/04/2011  . Hyponatremia 02/03/2011  . Anemia 02/02/2011  . COPD with acute exacerbation ([place]) 01/31/2011  . Hip fracture, intertrochanteric ([place]) 01/31/2011  . Wrist fracture 01/31/2011  . Thrombocytopenia ([place]) 01/31/2011  . GOUT, UNSPECIFIED 02/15/2009  . Eosinophilia 02/15/2009  . ALLERGIC RHINITIS, SEASONAL 02/15/2009  . RECTAL BLEEDING 02/15/2009  . ARTHRITIS 02/15/2009    Past Surgical History:  Procedure Laterality Date  . COLONOSCOPY N/A 01/23/2014   Dr. Oneida Alar: 1. No obvious  source for anemia identified. 2. one small rectal polyp, removed (hyperplastic polyp). 3. Moderate -sizeed hepatic flexure lipoma 4. Severe pan-colonic and rectal diverticulosis.   . COLONOSCOPY W/ BIOPSIES AND POLYPECTOMY  Feb 2011   Dr. Oneida Alar: small internal hemorrhoids, frequent diverticula, benign polypoid tissue and hyperplastic polyps   . COMPRESSION HIP SCREW  02/01/2011   Procedure: COMPRESSION HIP;  Surgeon: Sanjuana Kava, MD;  Location: [facility];  Service: Orthopedics;  Laterality: Left;  . ESOPHAGOGASTRODUODENOSCOPY N/A 01/23/2014   Dr. Oneida Alar: 1. No obvious source for anemia identified. 2. Mild gastritis. benign duodenal biopsy, chronic gastritis with H.pylori   . HARDWARE REMOVAL Left 05/20/2013   Procedure:  REMOVAL OF HARDWARE LEFT FEMUR WITH LOCAL BONE GRAFT;  Surgeon: Garald Balding, MD;  Location: [facility];  Service: Orthopedics;  Laterality: Left;  . Left hip ORIF  January 2013.   Dr. Luna Glasgow.  . MULTIPLE TOOTH EXTRACTIONS    . ORIF FEMUR FRACTURE  02/01/2011   Procedure: OPEN REDUCTION INTERNAL FIXATION (ORIF) DISTAL FEMUR FRACTURE;  Surgeon: Sanjuana Kava, MD;  Location: [facility];  Service: Orthopedics;  Laterality: Left;    Current  Outpatient Rx  . Order #: [id number] Class: Historical Med  . Order #: [id number] Class: Historical Med  . Order #: [id number] Class: Historical Med  . Order #: [id number] Class: Historical Med  . Order #: [id number] Class: Historical Med  . Order #: [id number] Class: Historical Med  . Order #: [id number] Class: Historical Med  . Order #: [id number] Class: Historical Med  . Order #: [id number] Class: Historical Med  . Order #: [id number] Class: Historical Med  . Order #: [id number] Class: Historical Med  . Order #: [id number] Class: Historical Med    Allergies Morphine and related and Penicillins  Family History  Problem Relation Age of Onset  . Cancer Mother        leukemia  . Emphysema Father   . Cancer Sister   . Cancer Brother   . Colon cancer Brother        diagnosed at age 3, deceased age 57.     Social History Social History   Tobacco Use  . Smoking status: Former Smoker    Packs/day: 4.00    Years: 20.00    Pack years: 80.00    Types: Cigarettes    Last attempt to quit: 05/13/1983    Years since quitting: 34.4  . Smokeless tobacco: Current User    Types: Chew  Substance Use Topics  . Alcohol use: Yes    Alcohol/week: 0.0 standard drinks    Comment: at times rarely  . Drug use: No    Review of Systems  All other systems negative except as documented in the HPI. All pertinent positives and negatives as reviewed in the HPI. ____________________________________________   PHYSICAL EXAM:  VITAL SIGNS: ED Triage Vitals  Enc Vitals Group     BP 10/19/17 1857 (!) 117/41     Pulse Rate 10/19/17 1849 96     Resp 10/19/17 1849 (!) 23     Temp 10/19/17 1849 100.3 F (37.9 C)     Temp Source 10/19/17 1849 Oral     SpO2 10/19/17 1849 98 %     Weight 10/19/17 1851 285 lb (129.3 kg)    Constitutional: Alert and oriented. Well appearing and in no acute distress. Eyes: Conjunctivae are normal. PERRL. EOMI. Head: Atraumatic. Nose: No congestion/rhinnorhea. Mouth/Throat: Mucous  membranes are dry.  Oropharynx non-erythematous. Neck: No stridor.  No meningeal signs.   Cardiovascular: tachycardic rate, regular rhythm. Good peripheral circulation. Grossly normal heart sounds.   Respiratory: tachypneic respiratory effort.  No retractions. Lungs diminished with bilateral rhonchi. Gastrointestinal: Soft, obese and nontender. No distention.  Musculoskeletal: No lower extremity tenderness nor edema. No gross deformities of extremities. Neurologic:  Normal speech and language. No gross focal neurologic deficits are appreciated.  Skin:  Skin is warm, dry and intact. No rash noted.  ____________________________________________   LABS (all labs ordered are listed, but only abnormal results are displayed)  Labs Reviewed  COMPREHENSIVE METABOLIC PANEL - Abnormal; Notable for the following components:      Result Value   Glucose, Bld 105 (*)    Creatinine, Ser 1.48 (*)  Calcium 8.3 (*)    GFR calc non Af Amer 46 (*)    GFR calc Af Amer 54 (*)    All other components within normal limits  CBC WITH DIFFERENTIAL/PLATELET - Abnormal; Notable for the following components:   WBC 1.5 (*)    RBC 2.02 (*)    Hemoglobin 6.4 (*)    HCT 19.3 (*)    RDW 18.2 (*)    Platelets 45 (*)    All other components within normal limits  VITAMIN B12 - Abnormal; Notable for the following components:   Vitamin B-12 150 (*)    All other components within normal limits  FERRITIN - Abnormal; Notable for the following components:   Ferritin 416 (*)    All other components within normal limits  RETICULOCYTES - Abnormal; Notable for the following components:   RBC. 2.00 (*)    All other components within normal limits  I-STAT CG4 LACTIC ACID, ED - Abnormal; Notable for the following components:   Lactic Acid, Venous 2.03 (*)    All other components within normal limits  I-STAT CHEM 8, ED - Abnormal; Notable for the following components:   Creatinine, Ser 1.60 (*)    Glucose, Bld 101 (*)     Calcium, Ion 1.12 (*)    Hemoglobin 6.5 (*)    HCT 19.0 (*)    All other components within normal limits  I-STAT CG4 LACTIC ACID, ED - Abnormal; Notable for the following components:   Lactic Acid, Venous 2.11 (*)    All other components within normal limits  CULTURE, BLOOD (ROUTINE X 2)  CULTURE, BLOOD (ROUTINE X 2)  EXPECTORATED SPUTUM ASSESSMENT W REFEX TO RESP CULTURE  URINALYSIS, ROUTINE W REFLEX MICROSCOPIC  FOLATE  IRON AND TIBC  TROPONIN I  TROPONIN I  TROPONIN I  HIV ANTIBODY (ROUTINE TESTING W REFLEX)  LACTIC ACID, PLASMA  BASIC METABOLIC PANEL  STREP PNEUMONIAE URINARY ANTIGEN  LEGIONELLA PNEUMOPHILA SEROGP 1 UR AG  I-STAT TROPONIN, ED  TYPE AND SCREEN  PREPARE RBC (CROSSMATCH)   ____________________________________________  EKG   EKG Interpretation  Date/Time:  Friday October 19 2017 18:51:03 EDT Ventricular Rate:  95 PR Interval:    QRS Duration: 101 QT Interval:  307 QTC Calculation: 386 R Axis:   64 Text Interpretation:  Sinus rhythm Ventricular premature complex Repol abnrm, severe global ischemia (LM/MVD) Confirmed by Merrily Pew [phone]) on 10/19/2017 6:54:37 PM        EKG Interpretation  Date/Time:  Friday October 19 2017 19:00:20 EDT Ventricular Rate:  87 PR Interval:    QRS Duration: 102 QT Interval:  318 QTC Calculation: 383 R Axis:   65 Text Interpretation:  Sinus rhythm Ventricular trigeminy Repol abnrm suggests ischemia, diffuse leads improved ST elevation and diffuse ST depressions since earlier in day. still new since 2015. Confirmed by Merrily Pew [phone]) on 10/19/2017 7:06:23 PM       ____________________________________________  RADIOLOGY  Dg Chest Port 1 View  Result Date: 10/19/2017 CLINICAL DATA:  Cough, congestion, and fever for 3 days. EXAM: PORTABLE CHEST 1 VIEW COMPARISON:  01/05/2014 FINDINGS: The cardiac silhouette is normal in size. The interstitial markings are diffusely increased throughout both lungs, and there  are asymmetric patchy opacities in the right lung base. Mild streaky left basilar opacities are present as well. No sizable pleural effusion or pneumothorax is identified. No acute osseous abnormality is seen. IMPRESSION: Diffuse interstitial and patchy right greater than left basilar airspace opacities concerning for pneumonia. Electronically Signed  By: Logan Bores M.D.   On: 10/19/2017 20:47    ____________________________________________   PROCEDURES  Procedure(s) performed:   Procedures  CRITICAL CARE Performed by: Merrily Pew Total critical care time: 45 minutes Critical care time was exclusive of separately billable procedures and treating other patients. Critical care was necessary to treat or prevent imminent or life-threatening deterioration. Critical care was time spent personally by me on the following activities: development of treatment plan with patient and/or surrogate as well as nursing, discussions with consultants, evaluation of patient's response to treatment, examination of patient, obtaining history from patient or surrogate, ordering and performing treatments and interventions, ordering and review of laboratory studies, ordering and review of radiographic studies, pulse oximetry and re-evaluation of patient's condition.  ____________________________________________   INITIAL IMPRESSION / ASSESSMENT AND PLAN / ED COURSE  First EKG with diffuse ST depressions elevation in aVR which could be concerning for left main disease versus multivessel disease however patient is also tachycardic, febrile with a productive cough is consistent with more likely pneumonia.  Page Durene Cal to STEMI Dr. to discuss whether not to call it is STEMI. EKG with improving changes. Discussed with Dr. Angelena Form and he agrees this is likely demand ischemia but there may be some underlying heart disease.  He suggest when patient is admitted to admit to [facility] so cardiology can be consulted if  needed. Hemoglobin low, likely secondary to myelofibrosis as his white count and platelets are low as well.  We will send off an anemia panel prior to transfusing 2 units. X-ray reviewed by myself and appears consistent with bilateral pneumonia. Antibiotics/fluid had already been given. Plan to admit to medicine.  Discussed with Dr. Burr Medico who recommends holding night time dose of Jak[place] and she will see when he arrives to [facility] or in the morning to help manage.   Pertinent labs & imaging results that were available during my care of the patient were reviewed by me and considered in my medical decision making (see chart for details).  ____________________________________________  FINAL CLINICAL IMPRESSION(S) / ED DIAGNOSES  Final diagnoses:  Community acquired pneumonia, unspecified laterality  Anemia, unspecified type  Abnormal ECG     MEDICATIONS GIVEN DURING THIS VISIT:  Medications  0.9 %  sodium chloride infusion (Manually program via Guardrails IV Fluids) (has no administration in time range)  allopurinol (ZYLOPRIM) tablet 300 mg (has no administration in time range)  pravastatin (PRAVACHOL) tablet 80 mg (has no administration in time range)  citalopram (CELEXA) tablet 20 mg (has no administration in time range)  hydrOXYzine (ATARAX/VISTARIL) tablet 25 mg (has no administration in time range)  pantoprazole (PROTONIX) EC tablet 40 mg (has no administration in time range)  tamsulosin (FLOMAX) capsule 0.4 mg (has no administration in time range)  mometasone-formoterol (DULERA) 200-5 MCG/ACT inhaler 2 puff (has no administration in time range)  umeclidinium bromide (INCRUSE ELLIPTA) 62.5 MCG/INH 2 puff (has no administration in time range)  mupirocin ointment (BACTROBAN) 2 % 1 application (has no administration in time range)  sodium chloride flush (NS) 0.9 % injection 3 mL (has no administration in time range)  0.9 %  sodium chloride infusion (has no administration in time range)    acetaminophen (TYLENOL) tablet 650 mg (has no administration in time range)    Or  acetaminophen (TYLENOL) suppository 650 mg (has no administration in time range)  senna-docusate (Senokot-S) tablet 1 tablet (has no administration in time range)  ondansetron (ZOFRAN) tablet 4 mg (has no administration in time range)  Or  ondansetron (ZOFRAN) injection 4 mg (has no administration in time range)  cefTRIAXone (ROCEPHIN) 2 g in sodium chloride 0.9 % 100 mL IVPB (has no administration in time range)  azithromycin (ZITHROMAX) 500 mg in sodium chloride 0.9 % 250 mL IVPB (has no administration in time range)  methylPREDNISolone sodium succinate (SOLU-MEDROL) 40 mg/mL injection 40 mg (has no administration in time range)  albuterol (PROVENTIL) (2.5 MG/3ML) 0.083% nebulizer solution 2.5 mg (has no administration in time range)  cefTRIAXone (ROCEPHIN) 1 g in sodium chloride 0.9 % 100 mL IVPB (has no administration in time range)  aspirin chewable tablet 324 mg (324 mg Oral Given 10/19/17 1940)  cefTRIAXone (ROCEPHIN) 1 g in sodium chloride 0.9 % 100 mL IVPB (0 g Intravenous Stopped 10/19/17 2017)  azithromycin (ZITHROMAX) 500 mg in sodium chloride 0.9 % 250 mL IVPB ( Intravenous Rate/Dose Verify 10/19/17 2105)  predniSONE (DELTASONE) tablet 60 mg (60 mg Oral Given 10/19/17 1940)  ipratropium-albuterol (DUONEB) 0.5-2.5 (3) MG/3ML nebulizer solution 3 mL (3 mLs Nebulization Given 10/19/17 1917)  lactated ringers bolus 1,000 mL ( Intravenous Stopped 10/19/17 2052)  lactated ringers bolus 1,000 mL ( Intravenous Stopped 10/19/17 2047)     NEW OUTPATIENT MEDICATIONS STARTED DURING THIS VISIT:  New Prescriptions   No medications on file    Note:  This note was prepared with assistance of Dragon voice recognition software. Occasional wrong-word or sound-a-like substitutions may have occurred due to the inherent limitations of voice recognition software.   Merrily Pew, MD 10/19/17 [phone]

## 2017-10-19 NOTE — ED Triage Notes (Signed)
Pt brought in by EMS. Called for CP the patient reports he has been sick with cough, congestion and fever for 3 days. Pt has 101 temp and given 3 nebs in route

## 2017-10-19 NOTE — ED Notes (Signed)
Pt states hes not having anymore chest pain but a burning in the middle of his chest

## 2017-10-19 NOTE — H&P (Signed)
History and Physical    Bruce [name] JYN:[id number] DOB: 12-26-1945 DOA: 10/19/2017  PCP: [name], MD   Patient coming from: Home   Chief Complaint: Fever, cough, chest congestion, malaise, burning and tightness in chest   HPI: Bruce [name] is a 71 y.o. male with medical history significant for myelofibrosis, COPD, chronic kidney disease stage III, GERD, and anxiety, now presenting to the emergency department for evaluation of productive cough, shortness of breath, malaise, fevers, and a burning and tightness in his chest.  Patient reports that symptoms developed with a productive cough and shortness of breath about a week ago.  He was also experiencing some burning and tightness in his chest at that time.  The symptoms have worsened over the ensuing days and he developed fevers with chills and general malaise over the past 1 to 2 days.  EMS was called, found the patient be febrile and tachypneic, administered multiple neb treatments, and brought him into the ED.  ED Course: Upon arrival to the ED, patient is found to have a temperature of 37.9 C, saturating adequately on 2 L/min supplemental oxygen, mildly tachypneic and tachycardic, and with stable blood pressure.  EKG features a sinus rhythm with PVCs and repolarization abnormality.  Chest x-ray demonstrates diffuse interstitial and patchy opacities concerning for pneumonia.  Chemistry panel features a creatinine 1.48, similar to priors.  CBC is concerning for pancytopenia with WBC 1500, hemoglobin 6.4, and platelets 45,000.  Lactic acid is slightly elevated at 2.03, urinalysis is unremarkable, and troponin is normal.  Blood cultures were collected, 2 units of RBC were ordered for immediate transfusion, 2 L of lactated Ringer's administered, and the patient was treated with 324 mg of aspirin, 60 mg prednisone, Rocephin, and azithromycin in the ED.  ED physician discussed with cardiology who advised that EKG changes are likely secondary  to pneumonia and recommended admission to [facility].  Oncology was also consulted by the ED physician, who advised holding ruxolitinib for now.  Patient remains hemodynamically stable, denies any chest pain at this time, and will be admitted for ongoing evaluation and management.  Review of Systems:  All other systems reviewed and apart from HPI, are negative.  Past Medical History:  Diagnosis Date  . Anemia 02/02/2011  . Asthma 12/01/2011   On symbicort 2 puffs BID  . Cancer ([place])    mylofibrosis  . COPD (chronic obstructive pulmonary disease) ([place])   . Degenerative joint disease   . Depression   . Eosinophilia   . Family history of anesthesia complication    brother had confusion after CABG  . GERD (gastroesophageal reflux disease)    takes Tums  . Gout   . Hip fracture, left [facility]) January 2013.   Status post ORIF  . Hyperlipidemia   . Laceration of finger, index    left  . Obesity   . Shortness of breath    with exertion  . Sinus disease   . Thrombocytopenia ([place])   . Wrist fracture, left     Past Surgical History:  Procedure Laterality Date  . COLONOSCOPY N/A 01/23/2014   Dr. Oneida Alar: 1. No obvious  source for anemia identified. 2. one small rectal polyp, removed (hyperplastic polyp). 3. Moderate -sizeed hepatic flexure lipoma 4. Severe pan-colonic and rectal diverticulosis.   . COLONOSCOPY W/ BIOPSIES AND POLYPECTOMY  Feb 2011   Dr. Oneida Alar: small internal hemorrhoids, frequent diverticula, benign polypoid tissue and hyperplastic polyps   . COMPRESSION HIP SCREW  02/01/2011  Procedure: COMPRESSION HIP;  Surgeon: Sanjuana Kava, MD;  Location: [facility];  Service: Orthopedics;  Laterality: Left;  . ESOPHAGOGASTRODUODENOSCOPY N/A 01/23/2014   Dr. Oneida Alar: 1. No obvious source for anemia identified. 2. Mild gastritis. benign duodenal biopsy, chronic gastritis with H.pylori   . HARDWARE REMOVAL Left 05/20/2013   Procedure: REMOVAL OF HARDWARE LEFT FEMUR WITH LOCAL BONE GRAFT;   Surgeon: Garald Balding, MD;  Location: [place];  Service: Orthopedics;  Laterality: Left;  . Left hip ORIF  January 2013.   Dr. Luna Glasgow.  . MULTIPLE TOOTH EXTRACTIONS    . ORIF FEMUR FRACTURE  02/01/2011   Procedure: OPEN REDUCTION INTERNAL FIXATION (ORIF) DISTAL FEMUR FRACTURE;  Surgeon: Sanjuana Kava, MD;  Location: [facility];  Service: Orthopedics;  Laterality: Left;     reports that he quit smoking about 34 years ago. His smoking use included cigarettes. He has a 80.00 pack-year smoking history. His smokeless tobacco use includes chew. He reports that he drinks alcohol. He reports that he does not use drugs.  Allergies  Allergen Reactions  . Morphine And Related Itching and Nausea Only    "makes him fell sick and talk out of his head"  . Penicillins Hives and Rash    Has patient had a PCN reaction causing immediate rash, facial/tongue/throat swelling, SOB or lightheadedness with hypotension: Yes Has patient had a PCN reaction causing severe rash involving mucus membranes or skin necrosis: No Has patient had a PCN reaction that required hospitalization: No Has patient had a PCN reaction occurring within the last 10 years: No If all of the above answers are "NO", then may proceed with Cephalosporin use.     Family History  Problem Relation Age of Onset  . Cancer Mother        leukemia  . Emphysema Father   . Cancer Sister   . Cancer Brother   . Colon cancer Brother        diagnosed at age 58, deceased age 22.      Prior to Admission medications   Medication Sig Start Date End Date Taking? Authorizing Provider  allopurinol (ZYLOPRIM) 300 MG tablet Take 300 mg by mouth daily.   Yes [provider]  budesonide-formoterol (SYMBICORT) 160-4.5 MCG/ACT inhaler Inhale 2 puffs into the lungs 2 (two) times daily.  10/27/13 10/19/17 Yes [provider]  Cholecalciferol (VITAMIN D3 PO) Take 2 capsules by mouth daily.   Yes [provider]  citalopram (CELEXA) 20  MG tablet Take 20 mg by mouth daily.  02/19/15  Yes [provider]  hydrOXYzine (ATARAX/VISTARIL) 25 MG tablet Take 25 mg by mouth 2 (two) times daily.  02/19/15  Yes [provider]  mupirocin ointment (BACTROBAN) 2 % Apply 1 application topically 2 (two) times daily.  10/11/17  Yes [provider]  pantoprazole (PROTONIX) 40 MG tablet Take 40 mg by mouth daily.   Yes [provider]  ruxolitinib phosphate (JAKAFI) 20 MG tablet Take 20 mg by mouth 2 (two) times daily.   Yes [provider]  tamsulosin (FLOMAX) 0.4 MG CAPS capsule Take 0.4 mg by mouth 2 (two) times daily.    Yes [provider]  triamcinolone cream (KENALOG) 0.1 % Apply 1 application topically 2 (two) times daily.  10/11/17  Yes [provider]  umeclidinium bromide (INCRUSE ELLIPTA) 62.5 MCG/INH AEPB Inhale 2 puffs into the lungs 2 (two) times daily.   Yes [provider]  pravastatin (PRAVACHOL) 80 MG tablet Take by mouth. 02/19/15  02/19/16  [provider]    Physical Exam: Vitals:   10/19/17 1857 10/19/17 2002 10/19/17 2030 10/19/17 2121  BP: (!) 117/41 115/63 102/81 (!) 105/55  Pulse:  (!) 103 81 82  Resp:  (!) 28 (!) 25 (!) 23  Temp:    99 F (37.2 C)  TempSrc:    Oral  SpO2:  100% 99% 95%  Weight:         Constitutional: NAD, calm  Eyes: PERTLA, lids and conjunctivae normal ENMT: Mucous membranes are moist. Posterior pharynx clear of any exudate or lesions.   Neck: normal, supple, no masses, no thyromegaly Respiratory: Mild tachypnea. Scattered rhonchi bilaterally. No accessory muscle use.  Cardiovascular: S1 & S2 heard, regular rate and rhythm. No extremity edema.   Abdomen: No distension, no tenderness, soft. Bowel sounds active.  Musculoskeletal: no clubbing / cyanosis. No joint deformity upper and lower extremities.    Skin: Ulcerated lesion on right cheek and left neck. Warm, dry, well-perfused. Neurologic: CN 2-12 grossly intact.  Sensation intact. Moving all extremities.  Psychiatric: Normal judgment and insight. Alert and oriented x 3. Normal mood and affect.    Labs on Admission: I have personally reviewed following labs and imaging studies  CBC: Recent Labs  Lab 10/19/17 1910 10/19/17 1928  WBC  --  1.5*  NEUTROABS  --  0.6  HGB 6.5* 6.4*  HCT 19.0* 19.3*  MCV  --  95.5  PLT  --  45*   Basic Metabolic Panel: Recent Labs  Lab 10/19/17 1910 10/19/17 1928  NA 137 137  K 4.2 4.0  CL 103 107  CO2  --  23  GLUCOSE 101* 105*  BUN 20 17  CREATININE 1.60* 1.48*  CALCIUM  --  8.3*   GFR: CrCl cannot be calculated (Unknown ideal weight.). Liver Function Tests: Recent Labs  Lab 10/19/17 1928  AST 21  ALT 22  ALKPHOS 58  BILITOT 1.0  PROT 7.0  ALBUMIN 4.2   No results for input(s): LIPASE, AMYLASE in the last 168 hours. No results for input(s): AMMONIA in the last 168 hours. Coagulation Profile: No results for input(s): INR, PROTIME in the last 168 hours. Cardiac Enzymes: No results for input(s): CKTOTAL, CKMB, CKMBINDEX, TROPONINI in the last 168 hours. BNP (last 3 results) No results for input(s): PROBNP in the last 8760 hours. HbA1C: No results for input(s): HGBA1C in the last 72 hours. CBG: No results for input(s): GLUCAP in the last 168 hours. Lipid Profile: No results for input(s): CHOL, HDL, LDLCALC, TRIG, CHOLHDL, LDLDIRECT in the last 72 hours. Thyroid Function Tests: No results for input(s): TSH, T4TOTAL, FREET4, T3FREE, THYROIDAB in the last 72 hours. Anemia Panel: Recent Labs    10/19/17 1940  VITAMINB12 150*  FERRITIN 416*  TIBC 341  IRON 83  RETICCTPCT 3.0   Urine analysis:    Component Value Date/Time   COLORURINE YELLOW 10/19/2017 1925   APPEARANCEUR CLEAR 10/19/2017 1925   LABSPEC 1.018 10/19/2017 1925   PHURINE 7.0 10/19/2017 1925   GLUCOSEU NEGATIVE 10/19/2017 1925   HGBUR NEGATIVE 10/19/2017 1925   BILIRUBINUR NEGATIVE 10/19/2017 1925   KETONESUR  NEGATIVE 10/19/2017 1925   PROTEINUR NEGATIVE 10/19/2017 1925   UROBILINOGEN 1.0 05/12/2013 0928   NITRITE NEGATIVE 10/19/2017 1925   LEUKOCYTESUR NEGATIVE 10/19/2017 1925   Sepsis Labs: @LABRCNTIP (procalcitonin:4,lacticidven:4) ) Recent Results (from the past 240 hour(s))  Blood Culture (routine x 2)     Status: None (Preliminary result)   Collection Time: 10/19/17  7:28 PM  Result Value Ref Range Status   Specimen Description RIGHT ANTECUBITAL  Final   Special Requests   Final    BOTTLES DRAWN AEROBIC AND ANAEROBIC Blood Culture results may not be optimal due to an excessive volume of blood received in culture bottles Performed at [facility], [address] [place], [place] [address]    Culture PENDING  Incomplete   Report Status PENDING  Incomplete  Blood Culture (routine x 2)     Status: None (Preliminary result)   Collection Time: 10/19/17  7:33 PM  Result Value Ref Range Status   Specimen Description RIGHT ANTECUBITAL  Final   Special Requests   Final    BOTTLES DRAWN AEROBIC AND ANAEROBIC Blood Culture adequate volume Performed at [facility], [address], [place], [place] [address]    Culture PENDING  Incomplete   Report Status PENDING  Incomplete     Radiological Exams on Admission: Dg Chest Port 1 View  Result Date: 10/19/2017 CLINICAL DATA:  Cough, congestion, and fever for 3 days. EXAM: PORTABLE CHEST 1 VIEW COMPARISON:  01/05/2014 FINDINGS: The cardiac silhouette is normal in size. The interstitial markings are diffusely increased throughout both lungs, and there are asymmetric patchy opacities in the right lung base. Mild streaky left basilar opacities are present as well. No sizable pleural effusion or pneumothorax is identified. No acute osseous abnormality is seen. IMPRESSION: Diffuse interstitial and patchy right greater than left basilar airspace opacities concerning for pneumonia. Electronically Signed   By: Logan Bores M.D.   On: 10/19/2017 20:47     EKG: Independently reviewed. Sinus rhythm, PVC, diffuse repolarization abnormality.   Assessment/Plan   1. Sepsis secondary to pneumonia  - Presents with 1 week of productive cough, SOB, and malaise, and 1-2 days of fevers and chills  - Found to have fever with EMS, mild tachycardia, leukopenia, and slightly elevated lactate  - CXR concerning for pneumonia  - Blood cultures collected in ED, 2 liters IVF given, and empiric Rocephin and azithromycin started  - Check sputum culture, check strep pneumo antigen, continue current antibiotics    2. Myelofibrosis; pancytopenia - Follows with heme/onc and managed with ruxolitinib  - Found to have pancytopenia on admission without bleeding  - Oncology consulted by ED physician, advised holding ruxolitinib for now  - 2 units RBC were ordered from ED, first running now  - Check post-transfusion CBC    3. Chest discomfort  - Reports 1 week of chest discomfort likely related to PNA, but EKG with marked abnormalities not seen on most recent available prior from 2015   - Troponin is negative  - ED discussed EKG with cardiology who advised likely secondary to PNA and recommended admission to [facility]  - ASA 324 mg was given in ED  - Continue cardiac monitoring, obtain serial troponin measurements, continue statin    4. COPD with acute exacerbation  - Likely precipitated by infection  - Treated with nebs prior to arrival and started on steroids in ED  - Check sputum culture, continue systemic steroid and antibiotics, continue ICS/LABA and Incruse, continue prn albuterol    5. CKD III  - SCr is 1.48 on admission, similar to priors - Renally-dose medications, monitor     DVT prophylaxis: SCD's  Code Status: Full  Family Communication: Wife and daughter updated at bedside  Consults called: Cardiology and oncology consulted by ED physician  Admission status: Inpatient     Vianne Bulls, MD [facility] Pager  [phone]  If 7PM-7AM,  please contact night-coverage [email or web address] Password [email or web address]  10/19/2017, 9:30 PM

## 2017-10-19 NOTE — ED Notes (Signed)
CRITICAL VALUE ALERT  Critical Value:  Hemoglobin 6.4  Date & Time Notied:  10/19/2017 2007  Provider Notified: dr.mesner  Orders Received/Actions taken: md notified

## 2017-10-20 ENCOUNTER — Other Ambulatory Visit (HOSPITAL_COMMUNITY): Payer: Medicare Other

## 2017-10-20 DIAGNOSIS — R5081 Fever presenting with conditions classified elsewhere: Secondary | ICD-10-CM

## 2017-10-20 DIAGNOSIS — F419 Anxiety disorder, unspecified: Secondary | ICD-10-CM

## 2017-10-20 DIAGNOSIS — N183 Chronic kidney disease, stage 3 (moderate): Secondary | ICD-10-CM

## 2017-10-20 DIAGNOSIS — J189 Pneumonia, unspecified organism: Secondary | ICD-10-CM

## 2017-10-20 DIAGNOSIS — D61810 Antineoplastic chemotherapy induced pancytopenia: Secondary | ICD-10-CM

## 2017-10-20 DIAGNOSIS — I129 Hypertensive chronic kidney disease with stage 1 through stage 4 chronic kidney disease, or unspecified chronic kidney disease: Secondary | ICD-10-CM

## 2017-10-20 DIAGNOSIS — J441 Chronic obstructive pulmonary disease with (acute) exacerbation: Secondary | ICD-10-CM

## 2017-10-20 DIAGNOSIS — Z6841 Body Mass Index (BMI) 40.0 and over, adult: Secondary | ICD-10-CM

## 2017-10-20 DIAGNOSIS — D7581 Myelofibrosis: Secondary | ICD-10-CM

## 2017-10-20 DIAGNOSIS — F329 Major depressive disorder, single episode, unspecified: Secondary | ICD-10-CM

## 2017-10-20 DIAGNOSIS — K219 Gastro-esophageal reflux disease without esophagitis: Secondary | ICD-10-CM

## 2017-10-20 DIAGNOSIS — T451X5A Adverse effect of antineoplastic and immunosuppressive drugs, initial encounter: Secondary | ICD-10-CM

## 2017-10-20 DIAGNOSIS — R161 Splenomegaly, not elsewhere classified: Secondary | ICD-10-CM

## 2017-10-20 DIAGNOSIS — I208 Other forms of angina pectoris: Secondary | ICD-10-CM

## 2017-10-20 DIAGNOSIS — F17220 Nicotine dependence, chewing tobacco, uncomplicated: Secondary | ICD-10-CM

## 2017-10-20 DIAGNOSIS — D61818 Other pancytopenia: Secondary | ICD-10-CM

## 2017-10-20 DIAGNOSIS — R9431 Abnormal electrocardiogram [ECG] [EKG]: Secondary | ICD-10-CM

## 2017-10-20 DIAGNOSIS — D649 Anemia, unspecified: Secondary | ICD-10-CM

## 2017-10-20 DIAGNOSIS — D709 Neutropenia, unspecified: Secondary | ICD-10-CM

## 2017-10-20 DIAGNOSIS — R079 Chest pain, unspecified: Secondary | ICD-10-CM

## 2017-10-20 LAB — SAVE SMEAR

## 2017-10-20 LAB — BASIC METABOLIC PANEL
ANION GAP: 11 (ref 5–15)
BUN: 17 mg/dL (ref 8–23)
CO2: 21 mmol/L — ABNORMAL LOW (ref 22–32)
Calcium: 8.5 mg/dL — ABNORMAL LOW (ref 8.9–10.3)
Chloride: 106 mmol/L (ref 98–111)
Creatinine, Ser: 1.43 mg/dL — ABNORMAL HIGH (ref 0.61–1.24)
GFR calc Af Amer: 56 mL/min — ABNORMAL LOW (ref >60)
GFR calc non Af Amer: 48 mL/min — ABNORMAL LOW (ref >60)
Glucose, Bld: 193 mg/dL — ABNORMAL HIGH (ref 70–99)
Potassium: 4.3 mmol/L (ref 3.5–5.1)
Sodium: 138 mmol/L (ref 135–145)

## 2017-10-20 LAB — CBC
HEMATOCRIT: 22.3 % — AB (ref 39.0–52.0)
Hemoglobin: 7.3 g/dL — ABNORMAL LOW (ref 13.0–17.0)
MCH: 31.6 pg (ref 26.0–34.0)
MCHC: 32.7 g/dL (ref 30.0–36.0)
MCV: 96.5 fL (ref 78.0–100.0)
Platelets: 38 10*3/uL — ABNORMAL LOW (ref 150–400)
RBC: 2.31 MIL/uL — ABNORMAL LOW (ref 4.22–5.81)
RDW: 18 % — AB (ref 11.5–15.5)
WBC: 1.8 10*3/uL — AB (ref 4.0–10.5)

## 2017-10-20 LAB — HEMOGLOBIN A1C
HEMOGLOBIN A1C: 5.8 % — AB (ref 4.8–5.6)
MEAN PLASMA GLUCOSE: 119.76 mg/dL

## 2017-10-20 LAB — INFLUENZA PANEL BY PCR (TYPE A & B)
INFLAPCR: NEGATIVE
INFLBPCR: NEGATIVE

## 2017-10-20 LAB — TROPONIN I
TROPONIN I: 0.15 ng/mL — AB (ref <0.03)
TROPONIN I: 0.14 ng/mL — AB (ref <0.03)

## 2017-10-20 LAB — PREPARE RBC (CROSSMATCH)

## 2017-10-20 LAB — ABO/RH: ABO/RH(D): O POS

## 2017-10-20 MED ORDER — SODIUM CHLORIDE 0.9% IV SOLUTION
Freq: Once | INTRAVENOUS | Status: AC
  Administered 2017-10-20: 21:00:00 via INTRAVENOUS

## 2017-10-20 MED ORDER — METHYLPREDNISOLONE SODIUM SUCC 40 MG IJ SOLR
40.0000 mg | Freq: Two times a day (BID) | INTRAMUSCULAR | Status: DC
  Administered 2017-10-20 – 2017-10-22 (×4): 40 mg via INTRAVENOUS
  Filled 2017-10-20 (×4): qty 1

## 2017-10-20 MED ORDER — TBO-FILGRASTIM 480 MCG/0.8ML ~~LOC~~ SOSY
480.0000 ug | PREFILLED_SYRINGE | Freq: Once | SUBCUTANEOUS | Status: AC
  Administered 2017-10-20 – 2017-10-22 (×3): 480 ug via SUBCUTANEOUS
  Filled 2017-10-20 (×3): qty 0.8

## 2017-10-20 MED ORDER — PNEUMOCOCCAL VAC POLYVALENT 25 MCG/0.5ML IJ INJ
0.5000 mL | INJECTION | INTRAMUSCULAR | Status: AC
  Administered 2017-10-22: 0.5 mL via INTRAMUSCULAR
  Filled 2017-10-20: qty 0.5

## 2017-10-20 MED ORDER — SODIUM CHLORIDE 0.9% IV SOLUTION: Freq: Once | INTRAVENOUS | Status: DC

## 2017-10-20 NOTE — Progress Notes (Signed)
Patient's troponin level reported to MD, no new order.  Bruce [name] [name] [name]

## 2017-10-20 NOTE — Progress Notes (Addendum)
PROGRESS NOTE   Bruce [name]  XBD:[id number]    DOB: 01-12-1946    DOA: 10/19/2017  PCP: [name], MD   I have briefly reviewed patients previous medical records in [facility].  Brief Narrative:  72 year old married male, independent, works in [place], PMH of myelofibrosis (Dr. Beryle Lathe, Hematology/Oncology), pronounced splenomegaly (20.6 by ultrasound 10/16/2017, decreased in comparison to prior study of 29 cm), hepatic steatosis, hepatomegaly, COPD, HTN, HLD, hyperuricemia, MDD & anxiety, BPH, stage III chronic kidney disease, GERD, initially presented to [facility] ED due to approximately 3 weeks history of progressive weakness, lethargy, cough which recently became productive of green sputum, dyspnea and 1 week history of upper midsternal pain, mostly with activity, relieved at times with rest, occasional radiation to right upper extremity and new onset high fever.  EMS found patient to be febrile and tachypneic.  EDP discussed with cardiology on-call due to chest pain and abnormal EKG and was advised transfer to [facility].  Patient admitted for sepsis secondary to community-acquired pneumonia, symptomatic anemia/pancytopenia (hemoglobin 6.4), chest pain with abnormal EKG.  Cardiology consulted.   Assessment & Plan:   Principal Problem:   Sepsis due to pneumonia [facility]) Active Problems:   COPD with acute exacerbation ([place])   Chest discomfort   Myelofibrosis ([place])   Pancytopenia ([place])   CKD (chronic kidney disease), stage III ([place])   Sepsis due to community-acquired pneumonia: Met sepsis criteria on admission.  Chest x-ray as below, suggestive of pneumonia.  Blood cultures x2: Negative to date.  Started empirically on IV ceftriaxone and azithromycin, continue.  Check flu panel PCR.  Symptomatic anemia/pancytopenia/myelofibrosis/pronounced splenomegaly: Patient follows with outpatient hematology/oncology.  Last hemoglobin 08/27/2017: 10.7.  Now presented with hemoglobin of 6.4 in  the absence of overt bleeding.  Transfused 1 unit PRBC and to get second unit PRBC this morning. Follow posttransfusion CBC.  New leukopenia/1.5.  Platelet count also worse than before, 83 > 45.  I discussed with and consulted Dr. Feng/Oncology who advised that his pancytopenia may be related to progressive myelofibrosis or due to ruxolitinib.  Recommends holding ruxolitinib, transfuse PRBC as needed, start Granix, check peripheral smear, no platelet transfusions unless bleeding and she will see in formal consultation.  Chest pain: EKG abnormal: ST depression T inversion in inferior lateral leads, troponin x1 Neg. could be related to demand ischemia from sepsis/pneumonia and symptomatic anemia.  Currently resolved.  Continue to cycle troponins.  I discussed with and consulted cardiology.  COPD with acute exacerbation: Likely precipitated by pneumonia.  Continue antibiotics, IV Solu-Medrol, bronchodilator nebulizations, oxygen supplementation.  Stage III chronic kidney disease: Creatinine in the 1.4 range which is his baseline.  Essential hypertension: Controlled.  Hyperlipidemia: Continue pravastatin.  Hyperuricemia: Continue allopurinol.  Morbid obesity/Body mass index is 45.98 kg/m.  Anxiety and depression: Continue Celexa and hydroxyzine.  BPH: Continue home medicines.    DVT prophylaxis: SCDs Code Status: Full Family Communication: Discussed in detail with patient's spouse and daughter at bedside. Disposition: DC home pending clinical improvement.   Consultants:  Cardiology. Medical oncology.  Procedures:  None  Antimicrobials:  IV ceftriaxone and azithromycin.   Subjective: Feels better.  Cough and dyspnea improved.  No further chest pain.  Denies dizziness or lightheadedness.  Denies bleeding, nausea, vomiting or black stools.  Was supposed to take flu shot soon but has not yet taken it.  Reports that he has been unwell since returning from a trip to [place] 3 weeks  ago.  Since then feeling generally weak, dry  cough, dyspnea, continue to work until a week or so ago when he mostly stayed at home, dyspnea got worse, cough got productive, and in the last 1 or 2 days PTA, developed fever and also chest pain.  ROS: As above, otherwise negative.  Objective:  Vitals:   10/20/17 0401 10/20/17 0838 10/20/17 0944 10/20/17 1026  BP: 127/66  (!) 155/68 134/75  Pulse: 70  71 65  Resp: 20   18  Temp: 98.6 F (37 C)   (!) 97.5 F (36.4 C)  TempSrc: Oral   Oral  SpO2: 96% 93%  94%  Weight:      Height:        Examination:  General exam: Pleasant elderly male, moderately built and morbidly obese, lying comfortably propped up in bed without distress. Respiratory system: Slightly harsh breath sounds bilaterally with few fine bibasilar crackles.  No wheezing or rhonchi.Marland Kitchen Respiratory effort normal. Cardiovascular system: S1 & S2 heard, RRR. No JVD, murmurs, rubs, gallops or clicks. No pedal edema.  Telemetry personally reviewed: Sinus rhythm with BBB morphology.  Occasional PVCs. Gastrointestinal system: Abdomen is nondistended/protuberant, soft and nontender.  Although has pronounced splenomegaly on recent ultrasound, unable to clearly palpate.  No other organomegaly or masses appreciated. Normal bowel sounds heard. Central nervous system: Alert and oriented. No focal neurological deficits. Extremities: Symmetric 5 x 5 power. Skin: No rashes, lesions or ulcers Psychiatry: Judgement and insight appear normal. Mood & affect appropriate.     Data Reviewed: I have personally reviewed following labs and imaging studies  CBC: Recent Labs  Lab 10/19/17 1910 10/19/17 1928  WBC  --  1.5*  NEUTROABS  --  0.6  HGB 6.5* 6.4*  HCT 19.0* 19.3*  MCV  --  95.5  PLT  --  45*   Basic Metabolic Panel: Recent Labs  Lab 10/19/17 1910 10/19/17 1928 10/20/17 0742  NA 137 137 138  K 4.2 4.0 4.3  CL 103 107 106  CO2  --  23 21*  GLUCOSE 101* 105* 193*  BUN _0 CREATININE 1.60* 1.48* 1.43*  CALCIUM  --  8.3* 8.5*   Liver Function Tests: Recent Labs  Lab 10/19/17 1928  AST 21  ALT 22  ALKPHOS 58  BILITOT 1.0  PROT 7.0  ALBUMIN 4.2   Cardiac Enzymes: Recent Labs  Lab 10/19/17 2134  TROPONINI <0.03     Recent Results (from the past 240 hour(s))  Blood Culture (routine x 2)     Status: None (Preliminary result)   Collection Time: 10/19/17  7:28 PM  Result Value Ref Range Status   Specimen Description RIGHT ANTECUBITAL  Final   Special Requests   Final    BOTTLES DRAWN AEROBIC AND ANAEROBIC Blood Culture results may not be optimal due to an excessive volume of blood received in culture bottles   Culture   Final    NO GROWTH < 12 HOURS Performed at [facility], [address] [place], [place] [address]    Report Status PENDING  Incomplete  Blood Culture (routine x 2)     Status: None (Preliminary result)   Collection Time: 10/19/17  7:33 PM  Result Value Ref Range Status   Specimen Description RIGHT ANTECUBITAL  Final   Special Requests   Final    BOTTLES DRAWN AEROBIC AND ANAEROBIC Blood Culture adequate volume   Culture   Final    NO GROWTH < 12 HOURS Performed at [facility], [address] [place], [place]  [address]    Report Status PENDING  Incomplete         Radiology Studies: Dg Chest Port 1 View  Result Date: 10/19/2017 CLINICAL DATA:  Cough, congestion, and fever for 3 days. EXAM: PORTABLE CHEST 1 VIEW COMPARISON:  01/05/2014 FINDINGS: The cardiac silhouette is normal in size. The interstitial markings are diffusely increased throughout both lungs, and there are asymmetric patchy opacities in the right lung base. Mild streaky left basilar opacities are present as well. No sizable pleural effusion or pneumothorax is identified. No acute osseous abnormality is seen. IMPRESSION: Diffuse interstitial and patchy right greater than left basilar airspace opacities concerning for pneumonia. Electronically Signed   By:  Logan Bores M.D.   On: 10/19/2017 20:47        Scheduled Meds: . sodium chloride   Intravenous Once  . sodium chloride   Intravenous Once  . allopurinol  300 mg Oral Daily  . citalopram  20 mg Oral Daily  . hydrOXYzine  25 mg Oral BID  . methylPREDNISolone (SOLU-MEDROL) injection  40 mg Intravenous Q8H  . mometasone-formoterol  2 puff Inhalation BID  . mupirocin ointment  1 application Topical BID  . pantoprazole  40 mg Oral Daily  . [START ON 10/21/2017] pneumococcal 23 valent vaccine  0.5 mL Intramuscular Tomorrow-1000  . pravastatin  80 mg Oral q1800  . sodium chloride flush  3 mL Intravenous Q12H  . tamsulosin  0.4 mg Oral BID  . umeclidinium bromide  1 puff Inhalation BID   Continuous Infusions: . azithromycin    . cefTRIAXone (ROCEPHIN)  IV       LOS: 1 day     Vernell Leep, MD, FACP, [facility] Pager [phone]  If 7PM-7AM, please contact night-coverage [email or web address] Password [email or web address] 10/20/2017, 10:59 AM

## 2017-10-20 NOTE — Progress Notes (Signed)
Patient was transferred from [facility]. Patient was stable upon arrival and was oriented to the unit. Admitting has been paged. Will continue to monitor.  Drue Flirt, RN

## 2017-10-20 NOTE — Consult Note (Signed)
Cardiology Consultation:   Patient ID: Bruce [name] MRN: [id number]; DOB: 07-04-1945  Admit date: 10/19/2017 Date of Consult: 10/20/2017  Primary Care Provider: [name], MD Primary Cardiologist: New Primary Electrophysiologist:  None    Patient Profile:   Bruce [name] is a 72 y.o. male with a hx of myelofibrosis, chronic kidney disease, COPD who is being seen today for the evaluation of exertional chest discomfort and abnormal ECG at the request of Dr. Algis Liming.  History of Present Illness:   Bruce [name] notes several weeks of progressive shortness of breath and fatigue. Over the last week or so, the cough has been productive of thick dark sputum. He also was noted to have fevers to 103 degrees at home, and despite outside temperature of >90 degrees, he had on thick clothes, four quilts, and the heat on at his house due to chills. EMS was called yesterday, and he was noted to be febrile and tachypneic at that time.  He notes that over the last few weeks, he has gotten so fatigued and short of breath that he has had to leave his job by about noon every day. When his breathing is very short, he gets a burning/tight chest sensation. It does not radiate, though he sometime notes right arm pain as well. No nausea, vomiting, or diaphoresis with it. It improves when he rests.   He denies any prior significant heart history. Denies heart cath, stents, or CABG. No echo or prior testing in our records.  His presentation, while notable for the fever/sputum and pneumonia found on chest x-ray, was also notable for profound anemia. He follows outside the [facility] system for his myelofibrosis. 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  Past Medical History:  Diagnosis Date  . Anemia 02/02/2011  . Asthma 12/01/2011   On symbicort 2 puffs BID  . Cancer ([place])    mylofibrosis  . COPD (chronic obstructive pulmonary  disease) ([place])   . Degenerative joint disease   . Depression   . Eosinophilia   . Family history of anesthesia complication    brother had confusion after CABG  . GERD (gastroesophageal reflux disease)    takes Tums  . Gout   . Hip fracture, left [facility]) January 2013.   Status post ORIF  . Hyperlipidemia   . Laceration of finger, index    left  . Obesity   . Shortness of breath    with exertion  . Sinus disease   . Thrombocytopenia ([place])   . Wrist fracture, left     Past Surgical History:  Procedure Laterality Date  . COLONOSCOPY N/A 01/23/2014   Dr. Oneida Alar: 1. No obvious  source for anemia identified. 2. one small rectal polyp, removed (hyperplastic polyp). 3. Moderate -sizeed hepatic flexure lipoma 4. Severe pan-colonic and rectal diverticulosis.   . COLONOSCOPY W/ BIOPSIES AND POLYPECTOMY  Feb 2011   Dr. Oneida Alar: small internal hemorrhoids, frequent diverticula, benign polypoid tissue and hyperplastic polyps   . COMPRESSION HIP SCREW  02/01/2011   Procedure: COMPRESSION HIP;  Surgeon: Sanjuana Kava, MD;  Location: [facility];  Service: Orthopedics;  Laterality: Left;  . ESOPHAGOGASTRODUODENOSCOPY N/A 01/23/2014   Dr. Oneida Alar: 1. No obvious source for anemia identified. 2. Mild gastritis. benign duodenal biopsy, chronic gastritis with H.pylori   . HARDWARE REMOVAL Left 05/20/2013   Procedure: REMOVAL OF HARDWARE LEFT FEMUR WITH LOCAL BONE GRAFT;  Surgeon: Garald Balding, MD;  Location:  [facility] OR;  Service: Orthopedics;  Laterality: Left;  . Left hip ORIF  January 2013.   Dr. Luna Glasgow.  . MULTIPLE TOOTH EXTRACTIONS    . ORIF FEMUR FRACTURE  02/01/2011   Procedure: OPEN REDUCTION INTERNAL FIXATION (ORIF) DISTAL FEMUR FRACTURE;  Surgeon: Sanjuana Kava, MD;  Location: [facility];  Service: Orthopedics;  Laterality: Left;     Home Medications:  Prior to Admission medications   Medication Sig Start Date End Date Taking? Authorizing Provider  allopurinol (ZYLOPRIM) 300 MG tablet Take 300 mg by mouth  daily.   Yes [provider]  budesonide-formoterol (SYMBICORT) 160-4.5 MCG/ACT inhaler Inhale 2 puffs into the lungs 2 (two) times daily.  10/27/13 10/19/17 Yes [provider]  Cholecalciferol (VITAMIN D3 PO) Take 2 capsules by mouth daily.   Yes [provider]  citalopram (CELEXA) 20 MG tablet Take 20 mg by mouth daily.  02/19/15  Yes [provider]  hydrOXYzine (ATARAX/VISTARIL) 25 MG tablet Take 25 mg by mouth 2 (two) times daily.  02/19/15  Yes [provider]  mupirocin ointment (BACTROBAN) 2 % Apply 1 application topically 2 (two) times daily.  10/11/17  Yes [provider]  pantoprazole (PROTONIX) 40 MG tablet Take 40 mg by mouth daily.   Yes [provider]  ruxolitinib phosphate (JAKAFI) 20 MG tablet Take 20 mg by mouth 2 (two) times daily.   Yes [provider]  tamsulosin (FLOMAX) 0.4 MG CAPS capsule Take 0.4 mg by mouth 2 (two) times daily.    Yes [provider]  triamcinolone cream (KENALOG) 0.1 % Apply 1 application topically 2 (two) times daily.  10/11/17  Yes [provider]  umeclidinium bromide (INCRUSE ELLIPTA) 62.5 MCG/INH AEPB Inhale 2 puffs into the lungs 2 (two) times daily.   Yes [provider]  pravastatin (PRAVACHOL) 80 MG tablet Take by mouth. 02/19/15 02/19/16  [provider]    Inpatient Medications: Scheduled Meds: . sodium chloride   Intravenous Once  . sodium chloride   Intravenous Once  . allopurinol  300 mg Oral Daily  . citalopram  20 mg Oral Daily  . hydrOXYzine  25 mg Oral BID  . methylPREDNISolone (SOLU-MEDROL) injection  40 mg Intravenous Q8H  . mometasone-formoterol  2 puff Inhalation BID  . mupirocin ointment  1 application Topical BID  . pantoprazole  40 mg Oral Daily  . [START ON 10/21/2017] pneumococcal 23 valent vaccine  0.5 mL Intramuscular Tomorrow-1000  . pravastatin  80 mg Oral q1800  . sodium chloride flush  3 mL Intravenous Q12H  .  tamsulosin  0.4 mg Oral BID  . umeclidinium bromide  1 puff Inhalation BID   Continuous Infusions: . azithromycin    . cefTRIAXone (ROCEPHIN)  IV     PRN Meds: acetaminophen **OR** acetaminophen, albuterol, ondansetron **OR** ondansetron (ZOFRAN) IV, senna-docusate  Allergies:    Allergies  Allergen Reactions  . Morphine And Related Itching and Nausea Only    "makes him fell sick and talk out of his head"  . Penicillins Hives and Rash    Has patient had a PCN reaction causing immediate rash, facial/tongue/throat swelling, SOB or lightheadedness with hypotension: Yes Has patient had a PCN reaction causing severe rash involving mucus membranes or skin necrosis: No Has patient had a PCN reaction that required hospitalization: No Has patient had a PCN reaction occurring within the last 10 years: No If all of the above answers are "NO", then may proceed with Cephalosporin use.  Social History:   Social History   Socioeconomic History  . Marital status: Married    Spouse name: Not on file  . Number of children: Not on file  . Years of education: Not on file  . Highest education level: Not on file  Occupational History  . Not on file  Social Needs  . Financial resource strain: Not on file  . Food insecurity:    Worry: Not on file    Inability: Not on file  . Transportation needs:    Medical: Not on file    Non-medical: Not on file  Tobacco Use  . Smoking status: Former Smoker    Packs/day: 4.00    Years: 20.00    Pack years: 80.00    Types: Cigarettes    Last attempt to quit: 05/13/1983    Years since quitting: 34.4  . Smokeless tobacco: Current User    Types: Chew  Substance and Sexual Activity  . Alcohol use: Yes    Alcohol/week: 0.0 standard drinks    Comment: at times rarely  . Drug use: No  . Sexual activity: Not Currently  Lifestyle  . Physical activity:    Days per week: Not on file    Minutes per session: Not on file  . Stress: Not on file    Relationships  . Social connections:    Talks on phone: Not on file    Gets together: Not on file    Attends religious service: Not on file    Active member of club or organization: Not on file    Attends meetings of clubs or organizations: Not on file    Relationship status: Not on file  . Intimate partner violence:    Fear of current or ex partner: Not on file    Emotionally abused: Not on file    Physically abused: Not on file    Forced sexual activity: Not on file  Other Topics Concern  . Not on file  Social History Narrative  . Not on file    Family History:    Family History  Problem Relation Age of Onset  . Cancer Mother        leukemia  . Emphysema Father   . Cancer Sister   . Cancer Brother   . Colon cancer Brother        diagnosed at age 70, deceased age 4.      ROS:  Please see the history of present illness.  Review of Systems  Constitutional: Positive for chills, diaphoresis, fever and malaise/fatigue.  HENT: Negative for ear pain and hearing loss.   Eyes: Negative for double vision and pain.  Respiratory: Positive for cough, sputum production and shortness of breath.   Cardiovascular: Positive for chest pain. Negative for palpitations, orthopnea, claudication, leg swelling and PND.  Gastrointestinal: Negative for blood in stool, melena, nausea and vomiting.  Genitourinary: Negative for dysuria and hematuria.  Musculoskeletal: Negative for falls and myalgias.  Skin: Negative for rash.  Neurological: Negative for sensory change, focal weakness and loss of consciousness.  Endo/Heme/Allergies: Bruises/bleeds easily.   All other ROS reviewed and negative.     Physical Exam/Data:   Vitals:   10/20/17 0401 10/20/17 0838 10/20/17 0944 10/20/17 1026  BP: 127/66  (!) 155/68 134/75  Pulse: 70  71 65  Resp: 20   18  Temp: 98.6 F (37 C)   (!) 97.5 F (36.4 C)  TempSrc: Oral   Oral  SpO2: 96% 93%  94%  Weight:      Height:        Intake/Output  Summary (Last 24 hours) at 10/20/2017 1104 Last data filed at 10/20/2017 1002 Gross per 24 hour  Intake 2766.46 ml  Output 300 ml  Net 2466.46 ml   Filed Weights   10/19/17 1851 10/20/17 0056  Weight: 129.3 kg 133.2 kg   Body mass index is 45.98 kg/m.  General:  Well nourished, well developed, in no acute distress HEENT: normal Lymph: no adenopathy Neck: no JVD Endocrine:  No thryomegaly Vascular: No carotid bruits; FA pulses 2+ bilaterally without bruits  Cardiac:  normal S1, S2; RRR; no murmur Lungs:  Rhonchi throughout with decreased breath sounds R>L Abd: protuberant, soft, nontender, no hepatomegaly  Ext: no edema Musculoskeletal:  No deformities, BUE and BLE strength normal and equal Skin: warm and dry. Face and neck lesion noted Neuro:  CNs 2-12 intact, no focal abnormalities noted Psych:  Normal affect   EKG:  The EKG was personally reviewed and demonstrates:  Sinus rhythm, TWI lateral leads, PVCs Telemetry:  Telemetry was personally reviewed and demonstrates:  Sinus rhythm, PVCs  Relevant CV Studies: none  Laboratory Data:  Chemistry Recent Labs  Lab 10/19/17 1910 10/19/17 1928 10/20/17 0742  NA 137 137 138  K 4.2 4.0 4.3  CL 103 107 106  CO2  --  23 21*  GLUCOSE 101* 105* 193*  BUN 20 17 17   CREATININE 1.60* 1.48* 1.43*  CALCIUM  --  8.3* 8.5*  GFRNONAA  --  46* 48*  GFRAA  --  54* 56*  ANIONGAP  --  7 11    Recent Labs  Lab 10/19/17 1928  PROT 7.0  ALBUMIN 4.2  AST 21  ALT 22  ALKPHOS 58  BILITOT 1.0   Hematology Recent Labs  Lab 10/19/17 1910 10/19/17 1928 10/19/17 1940  WBC  --  1.5*  --   RBC  --  2.02* 2.00*  HGB 6.5* 6.4*  --   HCT 19.0* 19.3*  --   MCV  --  95.5  --   MCH  --  31.7  --   MCHC  --  33.2  --   RDW  --  18.2*  --   PLT  --  45*  --    Cardiac Enzymes Recent Labs  Lab 10/19/17 2134  TROPONINI <0.03    Recent Labs  Lab 10/19/17 1913  TROPIPOC 0.03    BNPNo results for input(s): BNP, PROBNP in the  last 168 hours.  DDimer No results for input(s): DDIMER in the last 168 hours.  Radiology/Studies:  Dg Chest Port 1 View  Result Date: 10/19/2017 CLINICAL DATA:  Cough, congestion, and fever for 3 days. EXAM: PORTABLE CHEST 1 VIEW COMPARISON:  01/05/2014 FINDINGS: The cardiac silhouette is normal in size. The interstitial markings are diffusely increased throughout both lungs, and there are asymmetric patchy opacities in the right lung base. Mild streaky left basilar opacities are present as well. No sizable pleural effusion or pneumothorax is identified. No acute osseous abnormality is seen. IMPRESSION: Diffuse interstitial and patchy right greater than left basilar airspace opacities concerning for pneumonia. Electronically Signed   By: Logan Bores M.D.   On: 10/19/2017 20:47    Assessment and Plan:   1. Chest discomfort on exertion, ECG abnormalities: in the setting of pneumonia and possibly sepsis, in addition to profound anemia with Hgb of 6.4. Troponin was surprisingly negative; would not be surprised to see it elevated with  so much demand. He is feeling better from transfusions. -We spent a significant amount of time discussing his symptoms. He does not sound like unstable angina. His symptoms are with exertion, and with his very low hemoglobin and pneumonia, this is not surprising. We talked about how we evaluate angina, treatment risks and benefits, and how stents for angina (and not MI) do not prolong life or prevent MI. Given all of this, I would not put him on heparin, and I would not cath him at this time. -I did instruct him that if he was to having crushing chest pain while in the hospital, he should immediately let his nurse know, who can let us know. He would need stat ECG and troponins at that time. If he were to have an acute MI we could cath him but would need to closely consider stent options with his myelofibrosis and anemia/thrombocytopenia. He would not be a candidate for dual  antiplatelet therapy at this time. -I ordered an echo today. Once he is transfused appropriately and recovered from his pneumonia, could consider outpatient stress testing. Coronary CT is also an option. -no heparin, no aspirin given his anemia and thrombocytopenia -continue pravastatin  Per primary team: All of these issues complicate his symptoms and impact the options for cardiac evaluation Pneumonia, likely with sepsis Myelofibrosis, with pancytopenia COPD with acute exacerbation Chronic kidney disease, stage III  TIME SPENT WITH PATIENT: 80 minutes of direct patient care. More than 50% of that time was spent on coordination of care and counseling regarding chest pain, risk factors, management risks and benefits. Spent a significant amount of time discussing the need to better understand his anemia cause before we do any invasive cardiac procedures. Complex decision making based on the balance of his anemia/thrombocytopenia and acute infection with his chest discomfort and ECG changes.  Buford Dresser, MD, PhD [facility]   For questions or updates, please contact [place] Please consult [email or web address] for contact info under     Signed, Buford Dresser, MD  10/20/2017 11:04 AM

## 2017-10-20 NOTE — Progress Notes (Signed)
[place]  [phone]   HEMATOLOGY ONCOLOGY INPATIENT CONSULTATION   Bruce [name]  DOB: 02-15-1945  MR#: [id number]  CSN#: [id number]    Requesting Physician: [facility]  Patient Care Team: [name], MD as PCP - General Rourk, Cristopher Estimable, MD as Consulting Physician (Gastroenterology)  Reason for consult: Pancytopenia  History of present illness:   72 year old Caucasian male, with past medical history of myelofibrosis, on Jakafi, COPD, CKD stage III, GERD, obesity and anxiety, who was transferred from any pain emergency room yesterday for fever, progressive dyspnea with productive cough, and pancytopenia.  He was diagnosed with myelofibrosis about 2 years ago, and has been on Jakafi since then at 24m bid. He is under Dr. NJaclyn Primecare, last was seen on 07/17/2017. He last lab at the office on August 27, 2017 showed WBC 4.5, hemoglobin 10.7, platelet 83K.  He has noticed progressive weakness, dyspnea on exertion, not able to work for time as he used to do in the past 2 weeks, and developed productive cough and a fever a few days before, presented to any hospital yesterday.  His CBC yesterday showed WBC 1.5, with neutrophils 0.6, hemoglobin 4.6, and platelets 40K.  I spoke with ED physician last night, and held his [place] and he received 2 units of blood transfusion.  [facility] He overall feels better today.  MEDICAL HISTORY:  Past Medical History:  Diagnosis Date  . Anemia 02/02/2011  . Asthma 12/01/2011   On symbicort 2 puffs BID  . Cancer ([place]    mylofibrosis  . COPD (chronic obstructive pulmonary disease) ([place]   . Degenerative joint disease   . Depression   . Eosinophilia   . Family history of anesthesia complication    brother had confusion after CABG  . GERD (gastroesophageal reflux disease)    takes Tums  . Gout   . Hip fracture, left ([facility] January 2013.   Status post ORIF  . Hyperlipidemia   . Laceration of finger, index    left  . Obesity   . Shortness of breath    with exertion  . Sinus disease   . Thrombocytopenia ([place]   . Wrist fracture, left     SURGICAL HISTORY: Past Surgical History:  Procedure Laterality Date  . COLONOSCOPY N/A 01/23/2014   Dr. FOneida Alar 1. No obvious  source for anemia identified. 2. one small rectal polyp, removed (hyperplastic polyp). 3. Moderate -sizeed hepatic flexure lipoma 4. Severe pan-colonic and rectal diverticulosis.   . COLONOSCOPY W/ BIOPSIES AND POLYPECTOMY  Feb 2011   Dr. FOneida Alar small internal hemorrhoids, frequent diverticula, benign polypoid tissue and hyperplastic polyps   . COMPRESSION HIP SCREW  02/01/2011   Procedure: COMPRESSION HIP;  Surgeon: WSanjuana Kava MD;  Location: [facility];  Service: Orthopedics;  Laterality: Left;  . ESOPHAGOGASTRODUODENOSCOPY N/A 01/23/2014   Dr. FOneida Alar 1. No obvious source for anemia identified. 2. Mild gastritis. benign duodenal biopsy, chronic gastritis with H.pylori   . HARDWARE REMOVAL Left 05/20/2013   Procedure: REMOVAL OF HARDWARE LEFT FEMUR WITH LOCAL BONE GRAFT;  Surgeon: PGarald Balding MD;  [place]  Service: Orthopedics;  Laterality: Left;  . Left hip ORIF  January 2013.   Dr. KLuna Glasgow  . MULTIPLE TOOTH EXTRACTIONS    . ORIF FEMUR FRACTURE  02/01/2011   Procedure: OPEN REDUCTION INTERNAL FIXATION (ORIF) DISTAL FEMUR FRACTURE;  Surgeon: WSanjuana Kava MD;  Location: [facility];  Service: Orthopedics;  Laterality: Left;    SOCIAL HISTORY: Social History   Socioeconomic History  . Marital status: Married    Spouse name: Not on file  . Number of children: Not on file  . Years of education: Not on file  . Highest education level: Not on file  Occupational History  . Not on file  Social Needs  . Financial resource strain: Not on file  . Food insecurity:    Worry: Not on file    Inability: Not on file  . Transportation needs:    Medical: Not  on file    Non-medical: Not on file  Tobacco Use  . Smoking status: Former Smoker    Packs/day: 4.00    Years: 20.00    Pack years: 80.00    Types: Cigarettes    Last attempt to quit: 05/13/1983    Years since quitting: 34.4  . Smokeless tobacco: Current User    Types: Chew  Substance and Sexual Activity  . Alcohol use: Yes    Alcohol/week: 0.0 standard drinks    Comment: at times rarely  . Drug use: No  . Sexual activity: Not Currently  Lifestyle  . Physical activity:    Days per week: Not on file    Minutes per session: Not on file  . Stress: Not on file  Relationships  . Social connections:    Talks on phone: Not on file    Gets together: Not on file    Attends religious service: Not on file    Active member of club or organization: Not on file    Attends meetings of clubs or organizations: Not on file    Relationship status: Not on file  . Intimate partner violence:    Fear of current or ex partner: Not on file    Emotionally abused: Not on file    Physically abused: Not on file    Forced sexual activity: Not on file  Other Topics Concern  . Not on file  Social History Narrative  . Not on file    FAMILY HISTORY: Family History  Problem Relation Age of Onset  . Cancer Mother        leukemia  . Emphysema Father   . Cancer Sister   . Cancer Brother   . Colon cancer Brother        diagnosed at age 42, deceased age 23.     ALLERGIES:  is allergic to morphine and related and penicillins.  MEDICATIONS:  Current Facility-Administered Medications  Medication Dose Route Frequency Provider Last Rate Last Dose  . 0.9 %  sodium chloride infusion (Manually program via Guardrails IV Fluids)   Intravenous Once Opyd, Ilene Qua, MD      . 0.9 %  sodium chloride infusion (Manually program via Guardrails IV Fluids)   Intravenous Once Blount, Scarlette Shorts T, NP      . acetaminophen (TYLENOL) tablet 650 mg  650 mg Oral Q6H PRN Opyd, Ilene Qua, MD       Or  . acetaminophen  (TYLENOL) suppository 650 mg  650 mg Rectal Q6H PRN Opyd, Ilene Qua, MD      . albuterol (PROVENTIL) (2.5 MG/3ML) 0.083% nebulizer solution 2.5 mg  2.5 mg Nebulization Q6H PRN Opyd, Ilene Qua, MD      . allopurinol (ZYLOPRIM) tablet 300 mg  300 mg Oral Daily Opyd, Ilene Qua, MD   300 mg at 10/20/17 0942  . azithromycin (ZITHROMAX) 500 mg in sodium chloride 0.9 %  250 mL IVPB  500 mg Intravenous Q24H Opyd, Ilene Qua, MD      . cefTRIAXone (ROCEPHIN) 2 g in sodium chloride 0.9 % 100 mL IVPB  2 g Intravenous Q24H Opyd, Ilene Qua, MD      . citalopram (CELEXA) tablet 20 mg  20 mg Oral Daily Opyd, Ilene Qua, MD   20 mg at 10/20/17 0942  . hydrOXYzine (ATARAX/VISTARIL) tablet 25 mg  25 mg Oral BID Vianne Bulls, MD   25 mg at 10/20/17 0942  . methylPREDNISolone sodium succinate (SOLU-MEDROL) 40 mg/mL injection 40 mg  40 mg Intravenous Q12H Hongalgi, Anand D, MD      . mometasone-formoterol (DULERA) 200-5 MCG/ACT inhaler 2 puff  2 puff Inhalation BID Opyd, Ilene Qua, MD   2 puff at 10/20/17 1129  . mupirocin ointment (BACTROBAN) 2 % 1 application  1 application Topical BID Opyd, Ilene Qua, MD   1 application at 98/11/91 0943  . ondansetron (ZOFRAN) tablet 4 mg  4 mg Oral Q6H PRN Opyd, Ilene Qua, MD       Or  . ondansetron (ZOFRAN) injection 4 mg  4 mg Intravenous Q6H PRN Opyd, Ilene Qua, MD      . pantoprazole (PROTONIX) EC tablet 40 mg  40 mg Oral Daily Opyd, Ilene Qua, MD   40 mg at 10/20/17 0942  . [START ON 10/21/2017] pneumococcal 23 valent vaccine (PNU-IMMUNE) injection 0.5 mL  0.5 mL Intramuscular Tomorrow-1000 [facility], McAdmits, MD      . pravastatin (PRAVACHOL) tablet 80 mg  80 mg Oral q1800 Opyd, Ilene Qua, MD   80 mg at 10/20/17 0143  . senna-docusate (Senokot-S) tablet 1 tablet  1 tablet Oral QHS PRN Opyd, Ilene Qua, MD      . sodium chloride flush (NS) 0.9 % injection 3 mL  3 mL Intravenous Q12H Opyd, Ilene Qua, MD   3 mL at 10/20/17 0144  . tamsulosin (FLOMAX) capsule 0.4 mg  0.4 mg Oral  BID Opyd, Ilene Qua, MD   0.4 mg at 10/20/17 0942  . Tbo-Filgrastim (GRANIX) injection 480 mcg  480 mcg Subcutaneous ONCE-1800 Truitt Merle, MD      . umeclidinium bromide (INCRUSE ELLIPTA) 62.5 MCG/INH 1 puff  1 puff Inhalation BID Opyd, Ilene Qua, MD   1 puff at 10/20/17 0836    REVIEW OF SYSTEMS:   Constitutional: (+) fever and generalized weakness Eyes: Denies blurriness of vision, double vision or watery eyes Ears, nose, mouth, throat, and face: Denies mucositis or sore throat Respiratory: (+) Productive cough, dyspnea on minimal exertion Cardiovascular: Denies palpitation, chest discomfort or lower extremity swelling Gastrointestinal:  Denies nausea, heartburn or change in bowel habits Skin: Denies abnormal skin rashes Lymphatics: Denies new lymphadenopathy or easy bruising Neurological:Denies numbness, tingling or new weaknesses Behavioral/Psych: Mood is stable, no new changes  All other systems were reviewed with the patient and are negative.  PHYSICAL EXAMINATION: ECOG PERFORMANCE STATUS: 3 - Symptomatic, >50% confined to bed  Vitals:   10/20/17 1108 10/20/17 1307  BP: 134/60 138/66  Pulse: 60 80  Resp:  20  Temp: (!) 97.5 F (36.4 C) 97.6 F (36.4 C)  SpO2: 95% 96%   Filed Weights   10/19/17 1851 10/20/17 0056  Weight: 285 lb (129.3 kg) 293 lb 9.6 oz (133.2 kg)    GENERAL:alert, no distress and comfortable, obese male  SKIN: skin color, texture, turgor are normal, no rashes or significant lesions EYES: normal, conjunctiva are pink and non-injected, sclera clear OROPHARYNX:no exudate,  no erythema and lips, buccal mucosa, and tongue normal  NECK: supple, thyroid normal size, non-tender, without nodularity LYMPH:  no palpable lymphadenopathy in the cervical, axillary or inguinal LUNGS: clear to auscultation and percussion with normal breathing effort HEART: regular rate & rhythm and no murmurs and no lower extremity edema ABDOMEN:abdomen soft, non-tender and normal  bowel sounds Musculoskeletal:no cyanosis of digits and no clubbing  PSYCH: alert & oriented x 3 with fluent speech NEURO: no focal motor/sensory deficits  LABORATORY DATA:  I have reviewed the data as listed Lab Results  Component Value Date   WBC 1.5 (L) 10/19/2017   HGB 6.4 (LL) 10/19/2017   HCT 19.3 (L) 10/19/2017   MCV 95.5 10/19/2017   PLT 45 (L) 10/19/2017   Recent Labs    10/19/17 1910 10/19/17 1928 10/20/17 0742  NA 137 137 138  K 4.2 4.0 4.3  CL 103 107 106  CO2  --  23 21*  GLUCOSE 101* 105* 193*  BUN 20 17 17   CREATININE 1.60* 1.48* 1.43*  CALCIUM  --  8.3* 8.5*  GFRNONAA  --  46* 48*  GFRAA  --  54* 56*  PROT  --  7.0  --   ALBUMIN  --  4.2  --   AST  --  21  --   ALT  --  22  --   ALKPHOS  --  58  --   BILITOT  --  1.0  --     RADIOGRAPHIC STUDIES: I have personally reviewed the radiological images as listed and agreed with the findings in the report. Dg Chest Port 1 View  Result Date: 10/19/2017 CLINICAL DATA:  Cough, congestion, and fever for 3 days. EXAM: PORTABLE CHEST 1 VIEW COMPARISON:  01/05/2014 FINDINGS: The cardiac silhouette is normal in size. The interstitial markings are diffusely increased throughout both lungs, and there are asymmetric patchy opacities in the right lung base. Mild streaky left basilar opacities are present as well. No sizable pleural effusion or pneumothorax is identified. No acute osseous abnormality is seen. IMPRESSION: Diffuse interstitial and patchy right greater than left basilar airspace opacities concerning for pneumonia. Electronically Signed   By: Logan Bores M.D.   On: 10/19/2017 20:47    ASSESSMENT & PLAN: 72 year old Caucasian male, with past medical history of myelofibrosis, on Jakafi, COPD, CKD stage III, GERD, obesity and anxiety, who was transferred from any pain emergency room yesterday for fever, progressive dyspnea with productive cough, and pancytopenia.  1.  Community-acquired pneumonia, on  antibiotics 2.  Pancytopenia, likely secondary to [facility], vs disease progression of myelofibrosis 3.  Chest pain and demand ischemia 4. COPD exacerbation 5.  Stage III chronic kidney disease 6.  Hypertension 7.  Morbid obesity 8.  Anxiety and depression 9.  Myelofibrosis with significant splenomegaly   Recommendations: -Please hold Jakafi, to see if he is pancytopenia improves -Due to his current pneumonia, I recommend Granix 480 mcg injection daily until ANC>1.5K -Blood transfusion to maintain hemoglobin more than 8.0, due to his demending ischemia  -no need for plt transfusion unless active bleeding  -His peripheral blood did not show any blast, my suspicion for myelofibrosis progression to leukemia is very low, no need bone marrow biopsy at this point.  Certainly bone marrow biopsy can be considered if he has persistent prolonged pancytopenia. -he will f/u with Dr. Tressie Stalker after discharge  -I will f/u as needed, will watch his blood counts   I discussed the above with pt and his family  members. All questions were answered. The patient knows to call the clinic with any problems, questions or concerns.      Truitt Merle, MD 10/20/2017 3:06 PM

## 2017-10-21 ENCOUNTER — Inpatient Hospital Stay (HOSPITAL_COMMUNITY): Payer: Medicare Other

## 2017-10-21 LAB — CBC
HCT: 22.9 % — ABNORMAL LOW (ref 39.0–52.0)
Hemoglobin: 7.6 g/dL — ABNORMAL LOW (ref 13.0–17.0)
MCH: 31.9 pg (ref 26.0–34.0)
MCHC: 33.2 g/dL (ref 30.0–36.0)
MCV: 96.2 fL (ref 78.0–100.0)
PLATELETS: 26 10*3/uL — AB (ref 150–400)
RBC: 2.38 MIL/uL — AB (ref 4.22–5.81)
RDW: 18 % — ABNORMAL HIGH (ref 11.5–15.5)
WBC: 1.8 10*3/uL — ABNORMAL LOW (ref 4.0–10.5)

## 2017-10-21 LAB — CBC WITH DIFFERENTIAL/PLATELET
BASOS PCT: 1 %
Basophils Absolute: 0 10*3/uL (ref 0.0–0.1)
EOS PCT: 1 %
Eosinophils Absolute: 0 10*3/uL (ref 0.0–0.7)
HEMATOCRIT: 22.3 % — AB (ref 39.0–52.0)
HEMOGLOBIN: 7.3 g/dL — AB (ref 13.0–17.0)
Lymphocytes Relative: 12 %
Lymphs Abs: 0.2 10*3/uL — ABNORMAL LOW (ref 0.7–4.0)
MCH: 31.9 pg (ref 26.0–34.0)
MCHC: 32.7 g/dL (ref 30.0–36.0)
MCV: 97.4 fL (ref 78.0–100.0)
MONO ABS: 0.1 10*3/uL (ref 0.1–1.0)
Monocytes Relative: 6 %
NEUTROS PCT: 80 %
Neutro Abs: 1.1 10*3/uL — ABNORMAL LOW (ref 1.7–7.7)
Platelets: 36 10*3/uL — ABNORMAL LOW (ref 150–400)
RBC: 2.29 MIL/uL — ABNORMAL LOW (ref 4.22–5.81)
RDW: 18.2 % — ABNORMAL HIGH (ref 11.5–15.5)
WBC: 1.4 10*3/uL — CL (ref 4.0–10.5)

## 2017-10-21 LAB — BASIC METABOLIC PANEL
Anion gap: 9 (ref 5–15)
BUN: 19 mg/dL (ref 8–23)
CHLORIDE: 107 mmol/L (ref 98–111)
CO2: 20 mmol/L — AB (ref 22–32)
Calcium: 8.3 mg/dL — ABNORMAL LOW (ref 8.9–10.3)
Creatinine, Ser: 1.4 mg/dL — ABNORMAL HIGH (ref 0.61–1.24)
GFR calc Af Amer: 57 mL/min — ABNORMAL LOW (ref >60)
GFR calc non Af Amer: 49 mL/min — ABNORMAL LOW (ref >60)
GLUCOSE: 204 mg/dL — AB (ref 70–99)
POTASSIUM: 4.1 mmol/L (ref 3.5–5.1)
Sodium: 136 mmol/L (ref 135–145)

## 2017-10-21 LAB — TROPONIN I: Troponin I: 0.13 ng/mL (ref <0.03)

## 2017-10-21 MED ORDER — UMECLIDINIUM BROMIDE 62.5 MCG/INH IN AEPB
1.0000 | INHALATION_SPRAY | Freq: Every day | RESPIRATORY_TRACT | Status: DC
  Administered 2017-10-22 – 2017-10-24 (×3): 1 via RESPIRATORY_TRACT
  Filled 2017-10-21: qty 7

## 2017-10-21 MED ORDER — CYANOCOBALAMIN 1000 MCG/ML IJ SOLN
1000.0000 ug | Freq: Once | INTRAMUSCULAR | Status: AC
  Administered 2017-10-21: 1000 ug via INTRAMUSCULAR
  Filled 2017-10-21: qty 1

## 2017-10-21 MED ORDER — EPOETIN ALFA 10000 UNIT/ML IJ SOLN
10000.0000 [IU] | INTRAMUSCULAR | Status: AC
  Administered 2017-10-22: 10000 [IU] via INTRAVENOUS
  Filled 2017-10-21: qty 1

## 2017-10-21 MED ORDER — POLYETHYLENE GLYCOL 3350 17 G PO PACK
17.0000 g | PACK | Freq: Every day | ORAL | Status: DC
  Filled 2017-10-21 (×3): qty 1

## 2017-10-21 NOTE — Progress Notes (Signed)
Pt complaining of shortness of breath when going to bathroom.  Sats at rest was 90-91.  Dr's orders to maintain saturation above 92%.  Placed pt on 2l [facility].  He stated he felt better.  Sats 96%.

## 2017-10-21 NOTE — Progress Notes (Signed)
PROGRESS NOTE   Bruce [name]  QMV:[id number]    DOB: 1945/04/30    DOA: 10/19/2017  PCP: [name], MD   I have briefly reviewed patients previous medical [facility] in [facility].  Brief Narrative:  72 year old married male, independent, works in [employer], PMH of myelofibrosis (Dr. Beryle Lathe, Hematology/Oncology), pronounced splenomegaly (20.6 by ultrasound 10/16/2017, decreased in comparison to prior study of 29 cm), hepatic steatosis, hepatomegaly, COPD, HTN, HLD, hyperuricemia, MDD & anxiety, BPH, stage III chronic kidney disease, GERD, initially presented to [facility] ED due to approximately 3 weeks history of progressive weakness, lethargy, cough which recently became productive of green sputum, dyspnea and 1 week history of upper midsternal pain, mostly with activity, relieved at times with rest, occasional radiation to right upper extremity and new onset high fever.  EMS found patient to be febrile and tachypneic.  EDP discussed with cardiology on-call due to chest pain and abnormal EKG and was advised transfer to [facility].  Patient admitted for sepsis secondary to community-acquired pneumonia, symptomatic anemia/pancytopenia (hemoglobin 6.4), chest pain with abnormal EKG.  Cardiology consulted.   Assessment & Plan:   Principal Problem:   Sepsis due to pneumonia [facility]) Active Problems:   COPD with acute exacerbation ([place])   Chest discomfort   Myelofibrosis ([place])   Pancytopenia ([place])   CKD (chronic kidney disease), stage III ([facility])   Neutropenic fever ([place])   Sepsis due to community-acquired pneumonia: Met sepsis criteria on admission.  Chest x-ray as below, suggestive of pneumonia.  Blood cultures x2: Negative to date.  Started empirically on IV ceftriaxone and azithromycin, continue.  Flu panel PCR negative.  Sepsis physiology resolved.  Improving.  Symptomatic anemia/pancytopenia/myelofibrosis/pronounced splenomegaly: Patient follows with outpatient hematology/oncology.   Last hemoglobin 08/27/2017: 10.7.  Now presented with hemoglobin of 6.4 in the absence of overt bleeding.  As per oncology consultation Dr. Feng/Oncology who advised that his pancytopenia may be related to progressive myelofibrosis or due to ruxolitinib.  Recommends holding ruxolitinib, transfuse PRBC as needed, started Granix, check peripheral smear, no platelet transfusions unless bleeding.  After 3 units PRBCs, hemoglobin has gone up from 6.4-7.6 (suboptimal response).  Remains leukopenic/ANC 1.1 and on Granix.  Thrombocytopenia has also worsened from 45 on admission to 26.  Apart from minimal blood in sputum, no other overt bleeding.  I discussed with Dr. Burr Medico on 10/5: Transfuse if hemoglobin <7.5, plans to give a dose of EPO.  Follow CBC in a.m.  Chest pain/elevated troponins: EKG abnormal: ST depression T inversion in inferior lateral leads, troponin x1 Neg. could be related to demand ischemia from sepsis/pneumonia and symptomatic anemia.  No recurrence of chest pain.  Troponins elevated but flat 0.15 > 0.14 > 0.13.  As per cardiology, suspect type II NSTEMI and hence no anticoagulation or aspirin at this time.  Cath contraindicated due to severe anemia/thrombocytopenia.  Noninvasive imaging after his acute illness has settled.  TTE pending.  Continue statins.  COPD with acute exacerbation: Likely precipitated by pneumonia.  Continue antibiotics, IV Solu-Medrol, bronchodilator nebulizations, oxygen supplementation.  Improving.  Stage III chronic kidney disease: Creatinine in the 1.4 range which is his baseline.  Stable.  Essential hypertension: Controlled and stable.  Hyperlipidemia: Continue pravastatin.  Hyperuricemia: Continue allopurinol.  Morbid obesity/Body mass index is 46.3 kg/m.  Anxiety and depression: Continue Celexa and hydroxyzine.  BPH: Continue home medicines.  Hyperglycemia: Likely due to steroids.  A1c 5.8.  SSI.    DVT prophylaxis: SCDs Code Status: Full Family  Communication: Discussed in  detail with patient's spouse and daughter at bedside.  Updated care and answered questions. Disposition: DC home pending clinical improvement.   Consultants:  Cardiology. Medical oncology.  Procedures:  None  Antimicrobials:  IV ceftriaxone and azithromycin.   Subjective: Dyspnea on exertion with room air, better now with oxygen.  Cough with intermittent dark green sputum and occasional minimal specks of blood.  No chest pain.  Overall feels better compared to yesterday.  No abdominal pain.  ROS: As above, otherwise negative.  Objective:  Vitals:   10/21/17 0514 10/21/17 0749 10/21/17 0929 10/21/17 1607  BP: (!) 145/48  136/75 (!) 149/69  Pulse: 62  64 71  Resp: 20  18 18   Temp: 98.1 F (36.7 C)   97.8 F (36.6 C)  TempSrc: Oral   Oral  SpO2: 90% 91% 97% 97%  Weight: 134.1 kg     Height:        Examination:  General exam: Pleasant elderly male, moderately built and morbidly obese, sitting up at edge of bed with family at bedside.  Looks better compared to yesterday. Respiratory system: Improved breath sounds.  Occasional basal crackles but otherwise clear to auscultation without wheezing or rhonchi. Cardiovascular system: S1 & S2 heard, RRR. No JVD, murmurs, rubs, gallops or clicks. No pedal edema.  Telemetry personally reviewed: Sinus rhythm. Gastrointestinal system: Abdomen is nondistended/protuberant, soft and nontender.  Although has pronounced splenomegaly on recent ultrasound, unable to clearly palpate.  No other organomegaly or masses appreciated. Normal bowel sounds heard.  Stable without change. Central nervous system: Alert and oriented. No focal neurological deficits.  Stable. Extremities: Symmetric 5 x 5 power.  Stable. Skin: No rashes, lesions or ulcers Psychiatry: Judgement and insight appear normal. Mood & affect appropriate.     Data Reviewed: I have personally reviewed following labs and imaging studies  CBC: Recent Labs   Lab 10/19/17 1910 10/19/17 1928 10/20/17 1703 10/21/17 0250 10/21/17 0856  WBC  --  1.5* 1.8* 1.4* 1.8*  NEUTROABS  --  0.6  --  1.1*  --   HGB 6.5* 6.4* 7.3* 7.3* 7.6*  HCT 19.0* 19.3* 22.3* 22.3* 22.9*  MCV  --  95.5 96.5 97.4 96.2  PLT  --  45* 38* 36* 26*   Basic Metabolic Panel: Recent Labs  Lab 10/19/17 1910 10/19/17 1928 10/20/17 0742 10/21/17 0250  NA 137 137 138 136  K 4.2 4.0 4.3 4.1  CL 103 107 106 107  CO2  --  23 21* 20*  GLUCOSE 101* 105* 193* 204*  BUN 20 17 17 19   CREATININE 1.60* 1.48* 1.43* 1.40*  CALCIUM  --  8.3* 8.5* 8.3*   Liver Function Tests: Recent Labs  Lab 10/19/17 1928  AST 21  ALT 22  ALKPHOS 58  BILITOT 1.0  PROT 7.0  ALBUMIN 4.2   Cardiac Enzymes: Recent Labs  Lab 10/19/17 2134 10/20/17 1505 10/20/17 1930 10/21/17 0250  TROPONINI <0.03 0.15* 0.14* 0.13*     Recent Results (from the past 240 hour(s))  Blood Culture (routine x 2)     Status: None (Preliminary result)   Collection Time: 10/19/17  7:28 PM  Result Value Ref Range Status   Specimen Description RIGHT ANTECUBITAL  Final   Special Requests   Final    BOTTLES DRAWN AEROBIC AND ANAEROBIC Blood Culture results may not be optimal due to an excessive volume of blood received in culture bottles   Culture   Final    NO GROWTH 2 DAYS Performed at  [facility], [address], [place], [place] [address]    Report Status PENDING  Incomplete  Blood Culture (routine x 2)     Status: None (Preliminary result)   Collection Time: 10/19/17  7:33 PM  Result Value Ref Range Status   Specimen Description RIGHT ANTECUBITAL  Final   Special Requests   Final    BOTTLES DRAWN AEROBIC AND ANAEROBIC Blood Culture adequate volume   Culture   Final    NO GROWTH 2 DAYS Performed at [facility], [address] [place], [place] [address]    Report Status PENDING  Incomplete         Radiology Studies: Dg Chest Port 1 View  Result Date: 10/19/2017 CLINICAL DATA:  Cough,  congestion, and fever for 3 days. EXAM: PORTABLE CHEST 1 VIEW COMPARISON:  01/05/2014 FINDINGS: The cardiac silhouette is normal in size. The interstitial markings are diffusely increased throughout both lungs, and there are asymmetric patchy opacities in the right lung base. Mild streaky left basilar opacities are present as well. No sizable pleural effusion or pneumothorax is identified. No acute osseous abnormality is seen. IMPRESSION: Diffuse interstitial and patchy right greater than left basilar airspace opacities concerning for pneumonia. Electronically Signed   By: Logan Bores M.D.   On: 10/19/2017 20:47        Scheduled Meds: . sodium chloride   Intravenous Once  . sodium chloride   Intravenous Once  . allopurinol  300 mg Oral Daily  . citalopram  20 mg Oral Daily  . hydrOXYzine  25 mg Oral BID  . methylPREDNISolone (SOLU-MEDROL) injection  40 mg Intravenous Q12H  . mometasone-formoterol  2 puff Inhalation BID  . mupirocin ointment  1 application Topical BID  . pantoprazole  40 mg Oral Daily  . pneumococcal 23 valent vaccine  0.5 mL Intramuscular Tomorrow-1000  . pravastatin  80 mg Oral q1800  . sodium chloride flush  3 mL Intravenous Q12H  . tamsulosin  0.4 mg Oral BID  . Tbo-filgastrim (GRANIX) SQ  480 mcg Subcutaneous ONCE-1800  . [START ON 10/22/2017] umeclidinium bromide  1 puff Inhalation Daily   Continuous Infusions: . azithromycin 500 mg (10/21/17 0050)  . cefTRIAXone (ROCEPHIN)  IV Stopped (10/21/17 0045)     LOS: 2 days     Vernell Leep, MD, FACP, [facility]. [facility] Pager [phone]  If 7PM-7AM, please contact night-coverage [email or web address] Password [email or web address] 10/21/2017, 5:02 PM

## 2017-10-21 NOTE — Progress Notes (Signed)
Bruce [name]   DOB:May 14, 1945   OJ#:[id number]   XHB#:[id number]  Hem/onc follow up   Subjective: Patient has received 2 units of blood since he came to [facility], in addition to 1 unit he received at any pain.  He overall feels better, still has shortness of breath and tachypnea, and his belly is significantly bloated.  Afebrile, vital signs stable, no bleedings.  No other new complaints.   Objective:  Vitals:   10/21/17 0929 10/21/17 1607  BP: 136/75 (!) 149/69  Pulse: 64 71  Resp: 18 18  Temp:  97.8 F (36.6 C)  SpO2: 97% 97%    Body mass index is 46.3 kg/m.  Intake/Output Summary (Last 24 hours) at 10/21/2017 1707 Last data filed at 10/21/2017 0050 Gross per 24 hour  Intake 510 ml  Output 400 ml  Net 110 ml     Sclerae unicteric  Oropharynx clear  No peripheral adenopathy  Lungs clear -- no rales or rhonchi  Heart regular rate and rhythm  Abdomen significantly distended, nontender  MSK no focal spinal tenderness, no peripheral edema  Neuro nonfocal   CBG (last 3)  No results for input(s): GLUCAP in the last 72 hours.   Labs:  Lab Results  Component Value Date   WBC 1.8 (L) 10/21/2017   HGB 7.6 (L) 10/21/2017   HCT 22.9 (L) 10/21/2017   MCV 96.2 10/21/2017   PLT 26 (LL) 10/21/2017   NEUTROABS 1.1 (L) 10/21/2017   CMP Latest Ref Rng & Units 10/21/2017 10/20/2017 10/19/2017  Glucose 70 - 99 mg/dL 204(H) 193(H) 105(H)  BUN 8 - 23 mg/dL 19 17 17   Creatinine 0.61 - 1.24 mg/dL 1.40(H) 1.43(H) 1.48(H)  Sodium 135 - 145 mmol/L 136 138 137  Potassium 3.5 - 5.1 mmol/L 4.1 4.3 4.0  Chloride 98 - 111 mmol/L 107 106 107  CO2 22 - 32 mmol/L 20(L) 21(L) 23  Calcium 8.9 - 10.3 mg/dL 8.3(L) 8.5(L) 8.3(L)  Total Protein 6.5 - 8.1 g/dL - - 7.0  Total Bilirubin 0.3 - 1.2 mg/dL - - 1.0  Alkaline Phos 38 - 126 U/L - - 58  AST 15 - 41 U/L - - 21  ALT 0 - 44 U/L - - 22     Urine Studies No results for input(s): UHGB, CRYS in the last 72 hours.  Invalid input(s): UACOL,  UAPR, USPG, UPH, UTP, UGL, UKET, UBIL, UNIT, UROB, ULEU, UEPI, UWBC, [place], [place], [place], [place], [place]  Basic Metabolic Panel: Recent Labs  Lab 10/19/17 1910 10/19/17 1928 10/20/17 0742 10/21/17 0250  NA 137 137 138 136  K 4.2 4.0 4.3 4.1  CL 103 107 106 107  CO2  --  23 21* 20*  GLUCOSE 101* 105* 193* 204*  BUN 20 17 17 19   CREATININE 1.60* 1.48* 1.43* 1.40*  CALCIUM  --  8.3* 8.5* 8.3*   GFR Estimated Creatinine Clearance: 64.8 mL/min (A) (by C-G formula based on SCr of 1.4 mg/dL (H)). Liver Function Tests: Recent Labs  Lab 10/19/17 1928  AST 21  ALT 22  ALKPHOS 58  BILITOT 1.0  PROT 7.0  ALBUMIN 4.2   No results for input(s): LIPASE, AMYLASE in the last 168 hours. No results for input(s): AMMONIA in the last 168 hours. Coagulation profile No results for input(s): INR, PROTIME in the last 168 hours.  CBC: Recent Labs  Lab 10/19/17 1910 10/19/17 1928 10/20/17 1703 10/21/17 0250 10/21/17 0856  WBC  --  1.5* 1.8* 1.4* 1.8*  NEUTROABS  --  0.6  --  1.1*  --   HGB 6.5* 6.4* 7.3* 7.3* 7.6*  HCT 19.0* 19.3* 22.3* 22.3* 22.9*  MCV  --  95.5 96.5 97.4 96.2  PLT  --  45* 38* 36* 26*   Cardiac Enzymes: Recent Labs  Lab 10/19/17 2134 10/20/17 1505 10/20/17 1930 10/21/17 0250  TROPONINI <0.03 0.15* 0.14* 0.13*   BNP: Invalid input(s): POCBNP CBG: No results for input(s): GLUCAP in the last 168 hours. D-Dimer No results for input(s): DDIMER in the last 72 hours. Hgb A1c Recent Labs    10/20/17 1505  HGBA1C 5.8*   Lipid Profile No results for input(s): CHOL, HDL, LDLCALC, TRIG, CHOLHDL, LDLDIRECT in the last 72 hours. Thyroid function studies No results for input(s): TSH, T4TOTAL, T3FREE, THYROIDAB in the last 72 hours.  Invalid input(s): FREET3 Anemia work up Recent Labs    10/19/17 1940  VITAMINB12 150*  FOLATE 9.1  FERRITIN 416*  TIBC 341  IRON 83  RETICCTPCT 3.0   Microbiology Recent Results (from the past 240 hour(s))  Blood Culture  (routine x 2)     Status: None (Preliminary result)   Collection Time: 10/19/17  7:28 PM  Result Value Ref Range Status   Specimen Description RIGHT ANTECUBITAL  Final   Special Requests   Final    BOTTLES DRAWN AEROBIC AND ANAEROBIC Blood Culture results may not be optimal due to an excessive volume of blood received in culture bottles   Culture   Final    NO GROWTH 2 DAYS Performed at [facility], [address] [place], [place] [address]    Report Status PENDING  Incomplete  Blood Culture (routine x 2)     Status: None (Preliminary result)   Collection Time: 10/19/17  7:33 PM  Result Value Ref Range Status   Specimen Description RIGHT ANTECUBITAL  Final   Special Requests   Final    BOTTLES DRAWN AEROBIC AND ANAEROBIC Blood Culture adequate volume   Culture   Final    NO GROWTH 2 DAYS Performed at [facility], [address] [place], [place] [address]    Report Status PENDING  Incomplete      Studies:  Dg Chest Port 1 View  Result Date: 10/19/2017 CLINICAL DATA:  Cough, congestion, and fever for 3 days. EXAM: PORTABLE CHEST 1 VIEW COMPARISON:  01/05/2014 FINDINGS: The cardiac silhouette is normal in size. The interstitial markings are diffusely increased throughout both lungs, and there are asymmetric patchy opacities in the right lung base. Mild streaky left basilar opacities are present as well. No sizable pleural effusion or pneumothorax is identified. No acute osseous abnormality is seen. IMPRESSION: Diffuse interstitial and patchy right greater than left basilar airspace opacities concerning for pneumonia. Electronically Signed   By: Logan Bores M.D.   On: 10/19/2017 20:47    Assessment: 72 y.o. Caucasian male, with past medical history of myelofibrosis, on [place], COPD, CKD stage III, GERD, obesity and anxiety, who was transferred from any pain emergency room yesterday for fever, progressive dyspnea with productive cough, and pancytopenia.  1.  Community-acquired  pneumonia, on antibiotics 2.  Pancytopenia, likely secondary to [facility], vs disease progression of myelofibrosis 3.  Chest pain and demand ischemia 4. COPD exacerbation 5.  Stage III chronic kidney disease 6.  Hypertension 7.  Morbid obesity 8.  Anxiety and depression 9.  Myelofibrosis with significant splenomegaly 10. B12 deficiency    Recommendations: -He has received 3 units of RBC so far, hemoglobin 7.6, no signs of  bleeding.  He has no chest pain or other new symptoms, will observe today and consider more blood transfusion if hemoglobin less than 7.5 in the next few days. -In general EPO is not a very effective for myelofibrosis related anemia, given his anemia is multifactorial, and the possible related to [facility], I will give 1 dose of Epogen 10K today  -I will give one dose B12 injection today  -continue granix for now  -plt also trending down, no bleeding, observation for now  -I will touch base with Dr. Tressie Stalker tomorrow  -I will f/u    Truitt Merle, MD 10/21/2017  5:07 PM

## 2017-10-21 NOTE — Progress Notes (Signed)
Progress Note  Patient Name: Bruce [name] Date of Encounter: 10/21/2017  Primary Cardiologist: No primary care provider on file.   Subjective   Didn't sleep well overnight. Resting this AM. Feels much better with O2 on. No chest pain.  Inpatient Medications    Scheduled Meds: . sodium chloride   Intravenous Once  . sodium chloride   Intravenous Once  . allopurinol  300 mg Oral Daily  . citalopram  20 mg Oral Daily  . hydrOXYzine  25 mg Oral BID  . methylPREDNISolone (SOLU-MEDROL) injection  40 mg Intravenous Q12H  . mometasone-formoterol  2 puff Inhalation BID  . mupirocin ointment  1 application Topical BID  . pantoprazole  40 mg Oral Daily  . pneumococcal 23 valent vaccine  0.5 mL Intramuscular Tomorrow-1000  . pravastatin  80 mg Oral q1800  . sodium chloride flush  3 mL Intravenous Q12H  . tamsulosin  0.4 mg Oral BID  . Tbo-filgastrim (GRANIX) SQ  480 mcg Subcutaneous ONCE-1800  . [START ON 10/22/2017] umeclidinium bromide  1 puff Inhalation Daily   Continuous Infusions: . azithromycin 500 mg (10/21/17 0050)  . cefTRIAXone (ROCEPHIN)  IV Stopped (10/21/17 0045)   PRN Meds: acetaminophen **OR** acetaminophen, albuterol, ondansetron **OR** ondansetron (ZOFRAN) IV, senna-docusate   Vital Signs    Vitals:   10/21/17 0101 10/21/17 0514 10/21/17 0749 10/21/17 0929  BP: 102/67 (!) 145/48  136/75  Pulse: 70 62  64  Resp: 20 20  18   Temp: (!) 97.3 F (36.3 C) 98.1 F (36.7 C)    TempSrc: Oral Oral    SpO2: 93% 90% 91% 97%  Weight:  134.1 kg    Height:        Intake/Output Summary (Last 24 hours) at 10/21/2017 1228 Last data filed at 10/21/2017 0050 Gross per 24 hour  Intake 1149.5 ml  Output 800 ml  Net 349.5 ml   Filed Weights   10/19/17 1851 10/20/17 0056 10/21/17 0514  Weight: 129.3 kg 133.2 kg 134.1 kg    Telemetry    SR with occ PVCs - Personally Reviewed  ECG    No new since 10/4 - Personally Reviewed  Physical Exam   GEN: No acute  distress. Resting comfortable, [place] in place Neck: supple, no JVD Cardiac: regular S1 and S2, no murmurs, rubs, or gallops.  Respiratory: rhonchi throughout but improved from yesterday GI: protuberant but soft, nontender. Bowel sounds normal MS: No edema; No deformity. Neuro:  Nonfocal, moves all limbs independently Psych: Normal affect   Labs    Chemistry Recent Labs  Lab 10/19/17 1928 10/20/17 0742 10/21/17 0250  NA 137 138 136  K 4.0 4.3 4.1  CL 107 106 107  CO2 23 21* 20*  GLUCOSE 105* 193* 204*  BUN 17 17 19   CREATININE 1.48* 1.43* 1.40*  CALCIUM 8.3* 8.5* 8.3*  PROT 7.0  --   --   ALBUMIN 4.2  --   --   AST 21  --   --   ALT 22  --   --   ALKPHOS 58  --   --   BILITOT 1.0  --   --   GFRNONAA 46* 48* 49*  GFRAA 54* 56* 57*  ANIONGAP 7 11 9      Hematology Recent Labs  Lab 10/20/17 1703 10/21/17 0250 10/21/17 0856  WBC 1.8* 1.4* 1.8*  RBC 2.31* 2.29* 2.38*  HGB 7.3* 7.3* 7.6*  HCT 22.3* 22.3* 22.9*  MCV 96.5 97.4 96.2  MCH 31.6  31.9 31.9  MCHC 32.7 32.7 33.2  RDW 18.0* 18.2* 18.0*  PLT 38* 36* 26*    Cardiac Enzymes Recent Labs  Lab 10/19/17 2134 10/20/17 1505 10/20/17 1930 10/21/17 0250  TROPONINI <0.03 0.15* 0.14* 0.13*    Recent Labs  Lab 10/19/17 1913  TROPIPOC 0.03     BNPNo results for input(s): BNP, PROBNP in the last 168 hours.   DDimer No results for input(s): DDIMER in the last 168 hours.   Radiology    Dg Chest Port 1 View  Result Date: 10/19/2017 CLINICAL DATA:  Cough, congestion, and fever for 3 days. EXAM: PORTABLE CHEST 1 VIEW COMPARISON:  01/05/2014 FINDINGS: The cardiac silhouette is normal in size. The interstitial markings are diffusely increased throughout both lungs, and there are asymmetric patchy opacities in the right lung base. Mild streaky left basilar opacities are present as well. No sizable pleural effusion or pneumothorax is identified. No acute osseous abnormality is seen. IMPRESSION: Diffuse interstitial and  patchy right greater than left basilar airspace opacities concerning for pneumonia. Electronically Signed   By: Logan Bores M.D.   On: 10/19/2017 20:47    Cardiac Studies   Echo pending  Patient Profile     72 y.o. male with hx of myelofibrosis and now pancytopenia, chronic kidney disease, COPD who is being seen today for the evaluation of exertional chest discomfort and abnormal ECG at the request of Dr. Algis Liming  Assessment & Plan    1. Chest discomfort on exertion, ECG abnormalities, now with positive but flat troponins: in the setting of pneumonia and possibly sepsis, in addition to profound anemia with Hgb of 6.4.  -despite elevation of troponin (which is not surprising), I do not feel that this is primarily a cardiac driven process. His symptoms are exertional and likely driven by his anemia. He feels better with oxygen and transfusion. No chest pain since admission -given that this is a type II NSTEMI, and in the setting of pancytopenia, would not anticoagulate or use aspirin at this time -contraindicated for cath with his anemia/thrombocytopenia. Would do noninvasive imaging once his anemia and pneumonia improve for risk stratification -echo is pending -continue statin -I did instruct him that if he was to having crushing chest pain while in the hospital, he should immediately let his nurse know, who can let us know. He would need stat ECG and troponins at that time. If he were to have an acute MI we could cath him but would need to closely consider stent options with his myelofibrosis and anemia/thrombocytopenia.  -we spent extensive time discussing with his family present how the heart is impacted by his pneumonia and myelofibrosis. Did discuss that his need for home O2 would be assessed prior to discharge.  Per primary team: All of these issues complicate his symptoms and impact the options for cardiac evaluation Pneumonia, likely with sepsis Myelofibrosis, with  pancytopenia COPD with acute exacerbation Chronic kidney disease, stage III   Time Spent Directly with Patient: I have spent a total of 25 minutes with the patient reviewing hospital notes, telemetry, EKGs, labs and examining the patient as well as establishing an assessment and plan that was discussed personally with the patient.  > 50% of time was spent in direct patient care.  Length of Stay:  LOS: 2 days   Buford Dresser, MD, PhD [facility]  [facility]   10/21/2017, 12:28 PM  For questions or updates, please contact [place] Please consult [email or web address] for contact info under Cardiology/STEMI.

## 2017-10-22 ENCOUNTER — Inpatient Hospital Stay (HOSPITAL_COMMUNITY): Payer: Medicare Other

## 2017-10-22 DIAGNOSIS — R0789 Other chest pain: Secondary | ICD-10-CM

## 2017-10-22 DIAGNOSIS — I129 Hypertensive chronic kidney disease with stage 1 through stage 4 chronic kidney disease, or unspecified chronic kidney disease: Secondary | ICD-10-CM

## 2017-10-22 DIAGNOSIS — E669 Obesity, unspecified: Secondary | ICD-10-CM

## 2017-10-22 DIAGNOSIS — A419 Sepsis, unspecified organism: Principal | ICD-10-CM

## 2017-10-22 DIAGNOSIS — E538 Deficiency of other specified B group vitamins: Secondary | ICD-10-CM

## 2017-10-22 DIAGNOSIS — F418 Other specified anxiety disorders: Secondary | ICD-10-CM

## 2017-10-22 DIAGNOSIS — I214 Non-ST elevation (NSTEMI) myocardial infarction: Secondary | ICD-10-CM

## 2017-10-22 DIAGNOSIS — R0602 Shortness of breath: Secondary | ICD-10-CM

## 2017-10-22 LAB — BASIC METABOLIC PANEL
ANION GAP: 8 (ref 5–15)
BUN: 21 mg/dL (ref 8–23)
CALCIUM: 8.4 mg/dL — AB (ref 8.9–10.3)
CO2: 24 mmol/L (ref 22–32)
Chloride: 104 mmol/L (ref 98–111)
Creatinine, Ser: 1.3 mg/dL — ABNORMAL HIGH (ref 0.61–1.24)
GFR calc Af Amer: >60 mL/min (ref >60)
GFR, EST NON AFRICAN AMERICAN: 54 mL/min — AB (ref >60)
Glucose, Bld: 173 mg/dL — ABNORMAL HIGH (ref 70–99)
Potassium: 4.3 mmol/L (ref 3.5–5.1)
Sodium: 136 mmol/L (ref 135–145)

## 2017-10-22 LAB — CBC WITH DIFFERENTIAL/PLATELET
Basophils Absolute: 0 10*3/uL (ref 0.0–0.1)
Basophils Relative: 1 %
EOS PCT: 1 %
Eosinophils Absolute: 0 10*3/uL (ref 0.0–0.7)
HEMATOCRIT: 21.3 % — AB (ref 39.0–52.0)
Hemoglobin: 7 g/dL — ABNORMAL LOW (ref 13.0–17.0)
Lymphocytes Relative: 11 %
Lymphs Abs: 0.2 10*3/uL — ABNORMAL LOW (ref 0.7–4.0)
MCH: 31.8 pg (ref 26.0–34.0)
MCHC: 32.9 g/dL (ref 30.0–36.0)
MCV: 96.8 fL (ref 78.0–100.0)
MONO ABS: 0.2 10*3/uL (ref 0.1–1.0)
Monocytes Relative: 9 %
Neutro Abs: 1.3 10*3/uL — ABNORMAL LOW (ref 1.7–7.7)
Neutrophils Relative %: 78 %
PLATELETS: 36 10*3/uL — AB (ref 150–400)
RBC: 2.2 MIL/uL — ABNORMAL LOW (ref 4.22–5.81)
RDW: 17.9 % — AB (ref 11.5–15.5)
WBC Morphology: INCREASED
WBC: 1.7 10*3/uL — ABNORMAL LOW (ref 4.0–10.5)

## 2017-10-22 LAB — CBC
HCT: 25.2 % — ABNORMAL LOW (ref 39.0–52.0)
Hemoglobin: 8.3 g/dL — ABNORMAL LOW (ref 13.0–17.0)
MCH: 31.1 pg (ref 26.0–34.0)
MCHC: 32.9 g/dL (ref 30.0–36.0)
MCV: 94.4 fL (ref 78.0–100.0)
PLATELETS: 36 10*3/uL — AB (ref 150–400)
RBC: 2.67 MIL/uL — ABNORMAL LOW (ref 4.22–5.81)
RDW: 18 % — AB (ref 11.5–15.5)
WBC: 2.2 10*3/uL — AB (ref 4.0–10.5)

## 2017-10-22 LAB — PATHOLOGIST SMEAR REVIEW

## 2017-10-22 LAB — ECHOCARDIOGRAM COMPLETE
Height: 67 in
Weight: 4715.2 oz

## 2017-10-22 LAB — PREPARE RBC (CROSSMATCH)

## 2017-10-22 MED ORDER — PREDNISONE 20 MG PO TABS
40.0000 mg | ORAL_TABLET | Freq: Every day | ORAL | Status: DC
  Administered 2017-10-23 – 2017-10-24 (×2): 40 mg via ORAL
  Filled 2017-10-22 (×2): qty 2

## 2017-10-22 MED ORDER — SODIUM CHLORIDE 0.9% IV SOLUTION
Freq: Once | INTRAVENOUS | Status: AC
  Administered 2017-10-22: 10:00:00 via INTRAVENOUS

## 2017-10-22 NOTE — Progress Notes (Signed)
PROGRESS NOTE   Bruce [name]  OAC:[id number]    DOB: [birth year]    DOA: 10/19/2017  PCP: [name], MD   I have briefly reviewed patients previous medical records in [facility].  Brief Narrative:  72 year old married male, independent, works in [place], PMH of myelofibrosis (Dr. Beryle Lathe, Hematology/Oncology), pronounced splenomegaly (20.6 by ultrasound 10/16/2017, decreased in comparison to prior study of 29 cm), hepatic steatosis, hepatomegaly, COPD, HTN, HLD, hyperuricemia, MDD & anxiety, BPH, stage III chronic kidney disease, GERD, initially presented to [facility] ED due to approximately 3 weeks history of progressive weakness, lethargy, cough which recently became productive of green sputum, dyspnea and 1 week history of upper midsternal pain, mostly with activity, relieved at times with rest, occasional radiation to right upper extremity and new onset high fever.  EMS found patient to be febrile and tachypneic.  EDP discussed with cardiology on-call due to chest pain and abnormal EKG and was advised transfer to [facility].  Patient admitted for sepsis secondary to community-acquired pneumonia, symptomatic anemia/pancytopenia (hemoglobin 6.4), chest pain with abnormal EKG.  Cardiology consulted.  Sepsis resolved.  Transfusing as needed for anemia.   Assessment & Plan:   Principal Problem:   Sepsis due to pneumonia [facility]) Active Problems:   COPD with acute exacerbation ([place])   Chest discomfort   Myelofibrosis ([place])   Pancytopenia ([place])   CKD (chronic kidney disease), stage III (H[facility])   Neutropenic fever ([place])   Sepsis due to community-acquired pneumonia: Met sepsis criteria on admission.  Chest x-ray as below, suggestive of pneumonia.  Blood cultures x2: Negative to date.  Started empirically on IV ceftriaxone and azithromycin, continue.  Flu panel PCR negative.  Sepsis physiology resolved.  Improving.  Day 4/7 antibiotics.  Symptomatic  anemia/pancytopenia/myelofibrosis/pronounced splenomegaly: Patient follows with outpatient hematology/oncology.  Last hemoglobin pta 08/27/2017: 10.7.  Presented with hemoglobin of 6.4 in the absence of overt bleeding.  As per Dr. Feng/Oncology, his pancytopenia may be related to progressive myelofibrosis or due to ruxolitinib.  Ruxolitinib held, transfuse PRBC as needed, started Granix, no platelet transfusions unless bleeding.  After 3 units PRBCs, hemoglobin had gone up from 6.4-7.6 (suboptimal response).  Hemoglobin again down to 7 on 10/7 and improved to 8.3 after fourth unit of PRBC.  Hematology also started erythropoietin.  Still leukopenic/neutropenic (ANC 1.3) and on Granix.  Thrombocytopenia better.  No overt bleeding.  Chest pain/elevated troponins: EKG abnormal: ST depression T inversion in inferior lateral leads, troponin x1 Neg. could be related to demand ischemia from sepsis/pneumonia and symptomatic anemia.  No recurrence of chest pain.  Troponins elevated but flat 0.15 > 0.14 > 0.13.  As per cardiology, suspect type II NSTEMI and hence no anticoagulation or aspirin at this time.  Cath contraindicated due to severe anemia/thrombocytopenia.  Noninvasive imaging after his acute illness has settled.  TTE pending/per cardiology preliminary bedside read, normal EF without wall motion abnormalities.  Continue statins.  Cardiology plans outpatient follow-up.  COPD with acute exacerbation: Likely precipitated by pneumonia.  Continue antibiotics, IV Solu-Medrol, bronchodilator nebulizations, oxygen supplementation.  Bronchospasm clinically resolved, transition to oral prednisone in a.m.  Stage III chronic kidney disease: Creatinine in the 1.4 range which is his baseline.  Creatinine down to 1.3.  Essential hypertension: Controlled and stable.  Hyperlipidemia: Continue pravastatin.  Hyperuricemia: Continue allopurinol.  Morbid obesity/Body mass index is 46.16 kg/m.  Anxiety and depression:  Continue Celexa and hydroxyzine.  BPH: Continue home medicines.  Hyperglycemia: Likely due to steroids.  A1c 5.8.  SSI.    DVT prophylaxis: SCDs Code Status: Full Family Communication:  Discussed in detail with patient's spouse and daughter at bedside.  Updated care and answered questions. Disposition: DC home pending clinical improvement.   Consultants:  Cardiology. Medical oncology.  Procedures:  None  Antimicrobials:  IV ceftriaxone and azithromycin.   Subjective: Overall continues to feel better.  Dyspnea improving including with ambulating to bathroom on oxygen.  Blood and sputum significantly improved.  No chest pain.  Denies any other complaints.  ROS: As above, otherwise negative.  Objective:  Vitals:   10/22/17 0814 10/22/17 0959 10/22/17 1015 10/22/17 1330  BP:  (!) 145/70 140/67 (!) 146/70  Pulse:  99 65 (!) 53  Resp:  (!) 23 (!) 21 20  Temp:  97.6 F (36.4 C) (!) 97.5 F (36.4 C) 97.6 F (36.4 C)  TempSrc:  Oral Oral Oral  SpO2: 98% 100% 97% 96%  Weight:      Height:        Examination:  General exam: Pleasant elderly male, moderately built and morbidly obese, lying comfortably propped up in bed. Respiratory system: Clear to auscultation without wheezing, rhonchi or crackles.  No increased work of breathing. Cardiovascular system: S1 & S2 heard, RRR. No JVD, murmurs, rubs, gallops or clicks. No pedal edema.  Telemetry personally reviewed: SB in the 50s-SR with BBB morphology. Gastrointestinal system: Abdomen is nondistended/protuberant, soft and nontender.  Although has pronounced splenomegaly on recent ultrasound, unable to clearly palpate.  No other organomegaly or masses appreciated. Normal bowel sounds heard.  Stable without change Central nervous system: Alert and oriented. No focal neurological deficits.  Stable Extremities: Symmetric 5 x 5 power.  Stable. Skin: No rashes, lesions or ulcers Psychiatry: Judgement and insight appear normal. Mood  & affect appropriate.     Data Reviewed: I have personally reviewed following labs and imaging studies  CBC: Recent Labs  Lab 10/19/17 1928 10/20/17 1703 10/21/17 0250 10/21/17 0856 10/22/17 0527 10/22/17 1439  WBC 1.5* 1.8* 1.4* 1.8* 1.7* 2.2*  NEUTROABS 0.6  --  1.1*  --  1.3*  --   HGB 6.4* 7.3* 7.3* 7.6* 7.0* 8.3*  HCT 19.3* 22.3* 22.3* 22.9* 21.3* 25.2*  MCV 95.5 96.5 97.4 96.2 96.8 94.4  PLT 45* 38* 36* 26* 36* 36*   Basic Metabolic Panel: Recent Labs  Lab 10/19/17 1910 10/19/17 1928 10/20/17 0742 10/21/17 0250 10/22/17 0527  NA 137 137 138 136 136  K 4.2 4.0 4.3 4.1 4.3  CL 103 107 106 107 104  CO2  --  23 21* 20* 24  GLUCOSE 101* 105* 193* 204* 173*  BUN 20 17 17 19 21   CREATININE 1.60* 1.48* 1.43* 1.40* 1.30*  CALCIUM  --  8.3* 8.5* 8.3* 8.4*   Liver Function Tests: Recent Labs  Lab 10/19/17 1928  AST 21  ALT 22  ALKPHOS 58  BILITOT 1.0  PROT 7.0  ALBUMIN 4.2   Cardiac Enzymes: Recent Labs  Lab 10/19/17 2134 10/20/17 1505 10/20/17 1930 10/21/17 0250  TROPONINI <0.03 0.15* 0.14* 0.13*     Recent Results (from the past 240 hour(s))  Blood Culture (routine x 2)     Status: None (Preliminary result)   Collection Time: 10/19/17  7:28 PM  Result Value Ref Range Status   Specimen Description RIGHT ANTECUBITAL  Final   Special Requests   Final    BOTTLES DRAWN AEROBIC AND ANAEROBIC Blood Culture results may not be optimal due to an excessive volume of blood received  in culture bottles   Culture   Final    NO GROWTH 3 DAYS Performed at [facility], [address], [place], [place] [address]    Report Status PENDING  Incomplete  Blood Culture (routine x 2)     Status: None (Preliminary result)   Collection Time: 10/19/17  7:33 PM  Result Value Ref Range Status   Specimen Description RIGHT ANTECUBITAL  Final   Special Requests   Final    BOTTLES DRAWN AEROBIC AND ANAEROBIC Blood Culture adequate volume   Culture   Final    NO GROWTH 3  DAYS Performed at [facility], [address] [place], [place] [address]    Report Status PENDING  Incomplete         Radiology Studies: No results found.      Scheduled Meds: . allopurinol  300 mg Oral Daily  . citalopram  20 mg Oral Daily  . epoetin alfa  10,000 Units Intravenous Once per day on Mon Wed Fri  . hydrOXYzine  25 mg Oral BID  . methylPREDNISolone (SOLU-MEDROL) injection  40 mg Intravenous Q12H  . mometasone-formoterol  2 puff Inhalation BID  . mupirocin ointment  1 application Topical BID  . pantoprazole  40 mg Oral Daily  . pneumococcal 23 valent vaccine  0.5 mL Intramuscular Tomorrow-1000  . polyethylene glycol  17 g Oral Daily  . pravastatin  80 mg Oral q1800  . sodium chloride flush  3 mL Intravenous Q12H  . tamsulosin  0.4 mg Oral BID  . Tbo-filgastrim (GRANIX) SQ  480 mcg Subcutaneous ONCE-1800  . umeclidinium bromide  1 puff Inhalation Daily   Continuous Infusions: . azithromycin 500 mg (10/22/17 0008)  . cefTRIAXone (ROCEPHIN)  IV 2 g (10/21/17 2212)     LOS: 3 days     Vernell Leep, MD, FACP, [facility]. [facility] Pager [phone]  If 7PM-7AM, please contact night-coverage [email or web address] Password [facility] 10/22/2017, 3:37 PM

## 2017-10-22 NOTE — Progress Notes (Signed)
Progress Note  Patient Name: Bruce [name] Date of Encounter: 10/22/2017  Primary Cardiologist: Dr. Harrell Gave (new)  Subjective   Doing well today. No chest pain.   Inpatient Medications    Scheduled Meds: . allopurinol  300 mg Oral Daily  . citalopram  20 mg Oral Daily  . epoetin alfa  10,000 Units Intravenous Once per day on Mon Wed Fri  . hydrOXYzine  25 mg Oral BID  . methylPREDNISolone (SOLU-MEDROL) injection  40 mg Intravenous Q12H  . mometasone-formoterol  2 puff Inhalation BID  . mupirocin ointment  1 application Topical BID  . pantoprazole  40 mg Oral Daily  . pneumococcal 23 valent vaccine  0.5 mL Intramuscular Tomorrow-1000  . polyethylene glycol  17 g Oral Daily  . pravastatin  80 mg Oral q1800  . sodium chloride flush  3 mL Intravenous Q12H  . tamsulosin  0.4 mg Oral BID  . Tbo-filgastrim (GRANIX) SQ  480 mcg Subcutaneous ONCE-1800  . umeclidinium bromide  1 puff Inhalation Daily   Continuous Infusions: . azithromycin 500 mg (10/22/17 0008)  . cefTRIAXone (ROCEPHIN)  IV 2 g (10/21/17 2212)   PRN Meds: acetaminophen **OR** acetaminophen, albuterol, ondansetron **OR** ondansetron (ZOFRAN) IV, senna-docusate   Vital Signs    Vitals:   10/22/17 0814 10/22/17 0959 10/22/17 1015 10/22/17 1330  BP:  (!) 145/70 140/67 (!) 146/70  Pulse:  99 65 (!) 53  Resp:  (!) 23 (!) 21 20  Temp:  97.6 F (36.4 C) (!) 97.5 F (36.4 C) 97.6 F (36.4 C)  TempSrc:  Oral Oral Oral  SpO2: 98% 100% 97% 96%  Weight:      Height:        Intake/Output Summary (Last 24 hours) at 10/22/2017 1502 Last data filed at 10/22/2017 1418 Gross per 24 hour  Intake 900 ml  Output 350 ml  Net 550 ml   Filed Weights   10/20/17 0056 10/21/17 0514 10/22/17 0516  Weight: 133.2 kg 134.1 kg 133.7 kg    Telemetry    SR/sinus arrhythmia - Personally Reviewed  ECG    No new since 10/4 - Personally Reviewed  Physical Exam   GEN: No acute distress. Resting comfortable, [place] in  place Neck: supple, no JVD Cardiac: regular S1 and S2, no murmurs, rubs, or gallops.  Respiratory: improving rhonchi GI: protuberant but soft, nontender. Bowel sounds normal MS: No edema; No deformity. Neuro:  Nonfocal, moves all limbs independently Psych: Normal affect   Labs    Chemistry Recent Labs  Lab 10/19/17 1928 10/20/17 0742 10/21/17 0250 10/22/17 0527  NA 137 138 136 136  K 4.0 4.3 4.1 4.3  CL 107 106 107 104  CO2 23 21* 20* 24  GLUCOSE 105* 193* 204* 173*  BUN 17 17 19 21   CREATININE 1.48* 1.43* 1.40* 1.30*  CALCIUM 8.3* 8.5* 8.3* 8.4*  PROT 7.0  --   --   --   ALBUMIN 4.2  --   --   --   AST 21  --   --   --   ALT 22  --   --   --   ALKPHOS 58  --   --   --   BILITOT 1.0  --   --   --   GFRNONAA 46* 48* 49* 54*  GFRAA 54* 56* 57* >60  ANIONGAP 7 11 9 8      Hematology Recent Labs  Lab 10/21/17 0250 10/21/17 0856 10/22/17 0527  WBC 1.4* 1.8*  1.7*  RBC 2.29* 2.38* 2.20*  HGB 7.3* 7.6* 7.0*  HCT 22.3* 22.9* 21.3*  MCV 97.4 96.2 96.8  MCH 31.9 31.9 31.8  MCHC 32.7 33.2 32.9  RDW 18.2* 18.0* 17.9*  PLT 36* 26* 36*    Cardiac Enzymes Recent Labs  Lab 10/19/17 2134 10/20/17 1505 10/20/17 1930 10/21/17 0250  TROPONINI <0.03 0.15* 0.14* 0.13*    Recent Labs  Lab 10/19/17 1913  TROPIPOC 0.03     BNPNo results for input(s): BNP, PROBNP in the last 168 hours.   DDimer No results for input(s): DDIMER in the last 168 hours.   Radiology    No results found.  Cardiac Studies   Echo pending; I reviewed prelim images personally and noted preserved EF with grossly normal wall motion  Patient Profile     72 y.o. male with hx of myelofibrosis and now pancytopenia, chronic kidney disease, COPD who is being seen today for the evaluation of exertional chest discomfort and abnormal ECG at the request of Dr. Algis Liming  Assessment & Plan    1. Chest discomfort on exertion, ECG abnormalities, now with positive but flat troponins: in the setting of  pneumonia and possibly sepsis, in addition to profound anemia with Hgb of 6.4.  -type II NSTEMI, and in the setting of pancytopenia, would not anticoagulate or use aspirin at this time -contraindicated for cath with his anemia/thrombocytopenia. Would do noninvasive stress imaging once his anemia and pneumonia improve for risk stratification -echo is pending; my prelim bedside image review looks like normal EF without obvious wall motion abnormalities -continue statin -strict instructions given on need to call if he has chest pain while inpatient -we will follow up the formal results of his echo. May sign off tomorrow if normal, with plans for outpatient follow up  Per primary team: All of these issues complicate his symptoms and impact the options for cardiac evaluation Pneumonia, likely with sepsis Myelofibrosis, with pancytopenia COPD with acute exacerbation Chronic kidney disease, stage III   Time Spent Directly with Patient: I have spent a total of 15 minutes with the patient reviewing hospital notes, telemetry, EKGs, labs and examining the patient as well as establishing an assessment and plan that was discussed personally with the patient.  > 50% of time was spent in direct patient care.  Length of Stay:  LOS: 3 days   Buford Dresser, MD, PhD [facility]  [facility]   10/22/2017, 3:02 PM  For questions or updates, please contact [place] Please consult [email or web address] for contact info under Cardiology/STEMI.

## 2017-10-22 NOTE — Progress Notes (Signed)
  Echocardiogram 2D Echocardiogram has been performed.  Bruce [name] F 10/22/2017, 2:18 PM

## 2017-10-22 NOTE — Progress Notes (Signed)
Bruce [name]   DOB:06-28-1945   GG#:[id number]   VOJ#:[id number]  Hem/onc follow up   Subjective: Patient clinically stable, Hb dropped to 7.0 this morning and received 1u blood today.  Platelets was 26K this morning, no active bleeding.    Objective:  Vitals:   10/22/17 1015 10/22/17 1330  BP: 140/67 (!) 146/70  Pulse: 65 (!) 53  Resp: (!) 21 20  Temp: (!) 97.5 F (36.4 C) 97.6 F (36.4 C)  SpO2: 97% 96%    Body mass index is 46.16 kg/m.  Intake/Output Summary (Last 24 hours) at 10/22/2017 1919 Last data filed at 10/22/2017 1418 Gross per 24 hour  Intake 660 ml  Output 350 ml  Net 310 ml     Sclerae unicteric  Oropharynx clear  No peripheral adenopathy  Lungs clear -- no rales or rhonchi  Heart regular rate and rhythm  Abdomen significantly distended, nontender  MSK no focal spinal tenderness, no peripheral edema  Neuro nonfocal   CBG (last 3)  No results for input(s): GLUCAP in the last 72 hours.   Labs:  CBC Latest Ref Rng & Units 10/22/2017 10/22/2017 10/21/2017  WBC 4.0 - 10.5 K/uL 2.2(L) 1.7(L) 1.8(L)  Hemoglobin 13.0 - 17.0 g/dL 8.3(L) 7.0(L) 7.6(L)  Hematocrit 39.0 - 52.0 % 25.2(L) 21.3(L) 22.9(L)  Platelets 150 - 400 K/uL 36(L) 36(L) 26(LL)    CMP Latest Ref Rng & Units 10/22/2017 10/21/2017 10/20/2017  Glucose 70 - 99 mg/dL 173(H) 204(H) 193(H)  BUN 8 - 23 mg/dL 21 19 17   Creatinine 0.61 - 1.24 mg/dL 1.30(H) 1.40(H) 1.43(H)  Sodium 135 - 145 mmol/L 136 136 138  Potassium 3.5 - 5.1 mmol/L 4.3 4.1 4.3  Chloride 98 - 111 mmol/L 104 107 106  CO2 22 - 32 mmol/L 24 20(L) 21(L)  Calcium 8.9 - 10.3 mg/dL 8.4(L) 8.3(L) 8.5(L)  Total Protein 6.5 - 8.1 g/dL - - -  Total Bilirubin 0.3 - 1.2 mg/dL - - -  Alkaline Phos 38 - 126 U/L - - -  AST 15 - 41 U/L - - -  ALT 0 - 44 U/L - - -     Urine Studies No results for input(s): UHGB, CRYS in the last 72 hours.  Invalid input(s): UACOL, UAPR, USPG, UPH, UTP, UGL, UKET, UBIL, UNIT, UROB, [place], UEPI, UWBC, [place],  [place], [place], [place], [place]  Basic Metabolic Panel: Recent Labs  Lab 10/19/17 1910 10/19/17 1928 10/20/17 0742 10/21/17 0250 10/22/17 0527  NA 137 137 138 136 136  K 4.2 4.0 4.3 4.1 4.3  CL 103 107 106 107 104  CO2  --  23 21* 20* 24  GLUCOSE 101* 105* 193* 204* 173*  BUN 20 17 17 19 21   CREATININE 1.60* 1.48* 1.43* 1.40* 1.30*  CALCIUM  --  8.3* 8.5* 8.3* 8.4*   GFR Estimated Creatinine Clearance: 69.6 mL/min (A) (by C-G formula based on SCr of 1.3 mg/dL (H)). Liver Function Tests: Recent Labs  Lab 10/19/17 1928  AST 21  ALT 22  ALKPHOS 58  BILITOT 1.0  PROT 7.0  ALBUMIN 4.2   No results for input(s): LIPASE, AMYLASE in the last 168 hours. No results for input(s): AMMONIA in the last 168 hours. Coagulation profile No results for input(s): INR, PROTIME in the last 168 hours.  CBC: Recent Labs  Lab 10/19/17 1928 10/20/17 1703 10/21/17 0250 10/21/17 0856 10/22/17 0527 10/22/17 1439  WBC 1.5* 1.8* 1.4* 1.8* 1.7* 2.2*  NEUTROABS 0.6  --  1.1*  --  1.3*  --   HGB 6.4* 7.3* 7.3* 7.6* 7.0* 8.3*  HCT 19.3* 22.3* 22.3* 22.9* 21.3* 25.2*  MCV 95.5 96.5 97.4 96.2 96.8 94.4  PLT 45* 38* 36* 26* 36* 36*   Cardiac Enzymes: Recent Labs  Lab 10/19/17 2134 10/20/17 1505 10/20/17 1930 10/21/17 0250  TROPONINI <0.03 0.15* 0.14* 0.13*   BNP: Invalid input(s): POCBNP CBG: No results for input(s): GLUCAP in the last 168 hours. D-Dimer No results for input(s): DDIMER in the last 72 hours. Hgb A1c Recent Labs    10/20/17 1505  HGBA1C 5.8*   Lipid Profile No results for input(s): CHOL, HDL, LDLCALC, TRIG, CHOLHDL, LDLDIRECT in the last 72 hours. Thyroid function studies No results for input(s): TSH, T4TOTAL, T3FREE, THYROIDAB in the last 72 hours.  Invalid input(s): FREET3 Anemia work up Recent Labs    10/19/17 1940  VITAMINB12 150*  FOLATE 9.1  FERRITIN 416*  TIBC 341  IRON 83  RETICCTPCT 3.0   Microbiology Recent Results (from the past 240 hour(s))   Blood Culture (routine x 2)     Status: None (Preliminary result)   Collection Time: 10/19/17  7:28 PM  Result Value Ref Range Status   Specimen Description RIGHT ANTECUBITAL  Final   Special Requests   Final    BOTTLES DRAWN AEROBIC AND ANAEROBIC Blood Culture results may not be optimal due to an excessive volume of blood received in culture bottles   Culture   Final    NO GROWTH 3 DAYS Performed at [facility], [address] [place], [place] [address]    Report Status PENDING  Incomplete  Blood Culture (routine x 2)     Status: None (Preliminary result)   Collection Time: 10/19/17  7:33 PM  Result Value Ref Range Status   Specimen Description RIGHT ANTECUBITAL  Final   Special Requests   Final    BOTTLES DRAWN AEROBIC AND ANAEROBIC Blood Culture adequate volume   Culture   Final    NO GROWTH 3 DAYS Performed at [facility], [address], [place], [place] [address]    Report Status PENDING  Incomplete      Studies:  No results found.  Assessment: 72 y.o. Caucasian male, with past medical history of myelofibrosis, on Jakafi, COPD, CKD stage III, GERD, obesity and anxiety, who was transferred from any pain emergency room yesterday for fever, progressive dyspnea with productive cough, and pancytopenia.  1.  Community-acquired pneumonia, on antibiotics 2.  Pancytopenia, likely secondary to [facility], vs disease progression of myelofibrosis 3. NSTEMI  4. COPD exacerbation 5.  Stage III chronic kidney disease 6.  Hypertension 7.  Morbid obesity 8.  Anxiety and depression 9.  Myelofibrosis with significant splenomegaly 10. B12 deficiency    Recommendations: -his sepsis and pneumonia are improving, on ceftriaxone and azithromycin, clinically improved overall -He remains to be pancytopenia, hemoglobin dropped again this morning, no bleeding, received another unit (4th) but today -Plan to give 1 dose Epogen 10,000 units today -Cardiology does not recommend further work-up  at this point for his non-STEMI, likely demand ischemia. -I have spoken with his primary hematologist Dr. Tressie Stalker today an updated him. He will see him the week of 10/21 for f/u, he will likely reduced his Jakafi dose, OK to continue holding it on discharge   -consider RBC transfusion if Hg<7.5, no need plt transfusion unless active bleeding  -Continue granix until ANC>1.5k  -I will f/u, likely see him again on Wednesday, please cal me if needed  Truitt Merle, MD 10/22/2017  7:19 PM

## 2017-10-23 LAB — CBC WITH DIFFERENTIAL/PLATELET
Basophils Absolute: 0 10*3/uL (ref 0.0–0.1)
Basophils Relative: 1 %
EOS ABS: 0 10*3/uL (ref 0.0–0.5)
Eosinophils Relative: 1 %
HCT: 22.4 % — ABNORMAL LOW (ref 39.0–52.0)
Hemoglobin: 7.3 g/dL — ABNORMAL LOW (ref 13.0–17.0)
Lymphocytes Relative: 20 %
Lymphs Abs: 0.3 10*3/uL — ABNORMAL LOW (ref 0.7–4.0)
MCH: 31.1 pg (ref 26.0–34.0)
MCHC: 32.6 g/dL (ref 30.0–36.0)
MCV: 95.3 fL (ref 80.0–100.0)
MONO ABS: 0.1 10*3/uL (ref 0.1–1.0)
Monocytes Relative: 6 %
NEUTROS PCT: 72 %
Neutro Abs: 1.3 10*3/uL — ABNORMAL LOW (ref 1.7–7.7)
PLATELETS: 28 10*3/uL — AB (ref 150–400)
RBC: 2.35 MIL/uL — AB (ref 4.22–5.81)
RDW: 17.8 % — AB (ref 11.5–15.5)
WBC: 1.7 10*3/uL — AB (ref 4.0–10.5)

## 2017-10-23 LAB — TYPE AND SCREEN
ABO/RH(D): O POS
Antibody Screen: NEGATIVE
UNIT DIVISION: 0
Unit division: 0

## 2017-10-23 LAB — BASIC METABOLIC PANEL
Anion gap: 9 (ref 5–15)
BUN: 21 mg/dL (ref 8–23)
CALCIUM: 8.2 mg/dL — AB (ref 8.9–10.3)
CO2: 25 mmol/L (ref 22–32)
CREATININE: 1.28 mg/dL — AB (ref 0.61–1.24)
Chloride: 104 mmol/L (ref 98–111)
GFR, EST NON AFRICAN AMERICAN: 55 mL/min — AB (ref >60)
Glucose, Bld: 114 mg/dL — ABNORMAL HIGH (ref 70–99)
Potassium: 3.6 mmol/L (ref 3.5–5.1)
SODIUM: 138 mmol/L (ref 135–145)

## 2017-10-23 LAB — BPAM RBC
BLOOD PRODUCT EXPIRATION DATE: 201911042359
Blood Product Expiration Date: 201911072359
ISSUE DATE / TIME: 201910042129
UNIT TYPE AND RH: 5100
Unit Type and Rh: 5100

## 2017-10-23 LAB — PREPARE RBC (CROSSMATCH)

## 2017-10-23 MED ORDER — SODIUM CHLORIDE 0.9% IV SOLUTION
Freq: Once | INTRAVENOUS | Status: AC
  Administered 2017-10-23: 10 mL/h via INTRAVENOUS

## 2017-10-23 NOTE — Plan of Care (Signed)
  Problem: Activity: Goal: Risk for activity intolerance will decrease Outcome: Progressing   Problem: Elimination: Goal: Will not experience complications related to bowel motility Outcome: Progressing   Problem: Pain Managment: Goal: General experience of comfort will improve Outcome: Progressing   

## 2017-10-23 NOTE — Progress Notes (Signed)
PROGRESS NOTE   Bruce [name]  KGY:[id number]    DOB: 06/18/1945    DOA: 10/19/2017  PCP: [name], MD   I have briefly reviewed patients previous medical records in [facility].  Brief Narrative:  72 year old married male, independent, works in [employer], PMH of myelofibrosis (Dr. Beryle Lathe, Hematology/Oncology), pronounced splenomegaly (20.6 by ultrasound 10/16/2017, decreased in comparison to prior study of 29 cm), hepatic steatosis, hepatomegaly, COPD, HTN, HLD, hyperuricemia, MDD & anxiety, BPH, stage III chronic kidney disease, GERD, initially presented to [facility] ED due to approximately 3 weeks history of progressive weakness, lethargy, cough which recently became productive of green sputum, dyspnea and 1 week history of upper midsternal pain, mostly with activity, relieved at times with rest, occasional radiation to right upper extremity and new onset high fever.  EMS found patient to be febrile and tachypneic.  EDP discussed with cardiology on-call due to chest pain and abnormal EKG and was advised transfer to [facility].  Patient admitted for sepsis secondary to community-acquired pneumonia, symptomatic anemia/pancytopenia (hemoglobin 6.4), chest pain with abnormal EKG.  Cardiology consulted.  Sepsis resolved.  Hospital course complicated by persistent pancytopenia, transfusion dependent anemia.  Oncology will follow up 10/9.   Assessment & Plan:   Principal Problem:   Sepsis due to pneumonia [facility]) Active Problems:   COPD with acute exacerbation ([place])   Chest discomfort   Myelofibrosis ([place])   Pancytopenia ([place])   CKD (chronic kidney disease), stage III (H[facility])   Neutropenic fever ([place])   Sepsis due to community-acquired pneumonia: Met sepsis criteria on admission.  Chest x-ray as below, suggestive of pneumonia.  Blood cultures x2: Negative to date.  Started empirically on IV ceftriaxone and azithromycin, continue.  Flu panel PCR negative.  Sepsis physiology resolved.   Improving.  Day 5/7 antibiotics.  Symptomatic anemia/pancytopenia/myelofibrosis/pronounced splenomegaly: Patient follows with outpatient hematology/oncology.  Last hemoglobin pta 08/27/2017: 10.7.  Presented with hemoglobin of 6.4 in the absence of overt bleeding.  As per Dr. Feng/Oncology, his pancytopenia may be related to progressive myelofibrosis or due to ruxolitinib.  Ruxolitinib held, transfuse PRBC as needed, started Granix, no platelet transfusions unless bleeding.  Patient received 4 units of PRBC since admission, hemoglobin dropped again from 8.3 yesterday to 7.3 today.  Transfuse fifth unit of PRBC on 10/8 and follow posttransfusion CBCs.  Aim to keep hemoglobin >7.5.  Hematology also started erythropoietin.  Still leukopenic/neutropenic (ANC 1.3) and on Granix (continue until [place] >1.5).  Ongoing thrombocytopenia.  No overt bleeding. Dr. Burr Medico discussed with Dr. Tama High, primary oncologist on 10/7 who will follow-up the week of 10/21 and was okay to continue holding [place] at discharge until follow-up with him.  Chest pain/elevated troponins: EKG abnormal: ST depression T inversion in inferior lateral leads, troponin x1 Neg. could be related to demand ischemia from sepsis/pneumonia and symptomatic anemia.  No recurrence of chest pain.  Troponins elevated but flat 0.15 > 0.14 > 0.13.  As per cardiology, suspect type II NSTEMI and hence no anticoagulation or aspirin at this time.  Cath contraindicated due to severe anemia/thrombocytopenia.  Continue statins.  TTE 10/7: LVEF 55%, basal inferior akinesis and grade 2 diastolic dysfunction.  Cardiology has seen today and plan office visit and consider nuclear Lexiscan or coronary CTA at that time, signed off 10/8.  No aspirin/clopidogrel due to pancytopenia and no beta-blockers due to bradycardia.  COPD with acute exacerbation: Likely precipitated by pneumonia.  Continue antibiotics, IV Solu-Medrol, bronchodilator nebulizations, oxygen supplementation.   Bronchospasm clinically resolved, transitioned  to oral prednisone.  Stage III chronic kidney disease: Creatinine in the 1.4 range which is his baseline.  Creatinine down to 1.28  Essential hypertension: Controlled and stable.  Hyperlipidemia: Continue pravastatin.  Hyperuricemia: Continue allopurinol.  Morbid obesity/Body mass index is 45.73 kg/m.?  OSA: Patient and spouse report dyspnea on exertion for several months PTA.  He snores at night but no apneic spells.  Consider outpatient sleep study.  It appears that this may have been discussed with him in the past but he could not pursue it due to lack of insurance which he now has.  Anxiety and depression: Continue Celexa and hydroxyzine.  BPH: Continue home medicines.  Hyperglycemia: Likely due to steroids.  A1c 5.8.  SSI.    DVT prophylaxis: SCDs Code Status: Full Family Communication:  Discussed in detail with patient's spouse at bedside.  Updated care and answered questions. Disposition: DC home pending clinical improvement.   Consultants:  Cardiology-signed off 10/8. Medical oncology.  Procedures:  None  Antimicrobials:  IV ceftriaxone and azithromycin.   Subjective: Continues to feel better.  No chest pain.  Dyspnea improving but breathing not yet at baseline.  Minimal dark sputum on coughing.  ROS: As above, otherwise negative.  Objective:  Vitals:   10/23/17 0726 10/23/17 1230 10/23/17 1300 10/23/17 1511  BP:  (!) 118/45 (!) 153/70 (!) 156/66  Pulse:  62 (!) 55 (!) 55  Resp:  18 18 18   Temp:  97.8 F (36.6 C) 98 F (36.7 C) 98.1 F (36.7 C)  TempSrc:  Oral Oral Oral  SpO2: 97% 99% 99% 98%  Weight:      Height:        Examination:  General exam: Pleasant elderly male, moderately built and morbidly obese, lying comfortably propped up in bed.  Stable Respiratory system: Clear to auscultation without wheezing, rhonchi or crackles.  No increased work of breathing.  Stable Cardiovascular system: S1 &  S2 heard, RRR. No JVD, murmurs, rubs, gallops or clicks. No pedal edema.  Telemetry personally reviewed: SR-SB in the 47s.  Occasional PVCs. Gastrointestinal system: Abdomen is nondistended/protuberant, soft and nontender.  Although has pronounced splenomegaly on recent ultrasound, unable to clearly palpate.  No other organomegaly or masses appreciated. Normal bowel sounds heard.  Stable Central nervous system: Alert and oriented. No focal neurological deficits.  Stable Extremities: Symmetric 5 x 5 power.  Stable. Skin: No rashes, lesions or ulcers Psychiatry: Judgement and insight appear normal. Mood & affect appropriate.     Data Reviewed: I have personally reviewed following labs and imaging studies  CBC: Recent Labs  Lab 10/19/17 1928  10/21/17 0250 10/21/17 0856 10/22/17 0527 10/22/17 1439 10/23/17 0649  WBC 1.5*   < > 1.4* 1.8* 1.7* 2.2* 1.7*  NEUTROABS 0.6  --  1.1*  --  1.3*  --  1.3*  HGB 6.4*   < > 7.3* 7.6* 7.0* 8.3* 7.3*  HCT 19.3*   < > 22.3* 22.9* 21.3* 25.2* 22.4*  MCV 95.5   < > 97.4 96.2 96.8 94.4 95.3  PLT 45*   < > 36* 26* 36* 36* 28*   < > = values in this interval not displayed.   Basic Metabolic Panel: Recent Labs  Lab 10/19/17 1928 10/20/17 0742 10/21/17 0250 10/22/17 0527 10/23/17 0649  NA 137 138 136 136 138  K 4.0 4.3 4.1 4.3 3.6  CL 107 106 107 104 104  CO2 23 21* 20* 24 25  GLUCOSE 105* 193* 204* 173* 114*  BUN  17 17 19 21 21   CREATININE 1.48* 1.43* 1.40* 1.30* 1.28*  CALCIUM 8.3* 8.5* 8.3* 8.4* 8.2*   Liver Function Tests: Recent Labs  Lab 10/19/17 1928  AST 21  ALT 22  ALKPHOS 58  BILITOT 1.0  PROT 7.0  ALBUMIN 4.2   Cardiac Enzymes: Recent Labs  Lab 10/19/17 2134 10/20/17 1505 10/20/17 1930 10/21/17 0250  TROPONINI <0.03 0.15* 0.14* 0.13*     Recent Results (from the past 240 hour(s))  Blood Culture (routine x 2)     Status: None (Preliminary result)   Collection Time: 10/19/17  7:28 PM  Result Value Ref Range Status    Specimen Description RIGHT ANTECUBITAL  Final   Special Requests   Final    BOTTLES DRAWN AEROBIC AND ANAEROBIC Blood Culture results may not be optimal due to an excessive volume of blood received in culture bottles   Culture   Final    NO GROWTH 4 DAYS Performed at [facility], [address] [place], [place] [address]    Report Status PENDING  Incomplete  Blood Culture (routine x 2)     Status: None (Preliminary result)   Collection Time: 10/19/17  7:33 PM  Result Value Ref Range Status   Specimen Description RIGHT ANTECUBITAL  Final   Special Requests   Final    BOTTLES DRAWN AEROBIC AND ANAEROBIC Blood Culture adequate volume   Culture   Final    NO GROWTH 4 DAYS Performed at [facility], [address], [place], [place] [address]    Report Status PENDING  Incomplete         Radiology Studies: No results found.      Scheduled Meds: . allopurinol  300 mg Oral Daily  . citalopram  20 mg Oral Daily  . hydrOXYzine  25 mg Oral BID  . mometasone-formoterol  2 puff Inhalation BID  . mupirocin ointment  1 application Topical BID  . pantoprazole  40 mg Oral Daily  . polyethylene glycol  17 g Oral Daily  . pravastatin  80 mg Oral q1800  . predniSONE  40 mg Oral Q breakfast  . sodium chloride flush  3 mL Intravenous Q12H  . tamsulosin  0.4 mg Oral BID  . umeclidinium bromide  1 puff Inhalation Daily   Continuous Infusions: . azithromycin Stopped (10/22/17 2250)  . cefTRIAXone (ROCEPHIN)  IV Stopped (10/22/17 2148)     LOS: 4 days     Vernell Leep, MD, FACP, [facility]. [facility] Pager [phone]  If 7PM-7AM, please contact night-coverage [email or web address] Password [email or web address] 10/23/2017, 4:18 PM

## 2017-10-23 NOTE — Progress Notes (Signed)
Progress Note  Patient Name: Bruce [name] Date of Encounter: 10/23/2017  Primary Cardiologist: Buford Dresser, MD   Subjective   Doing fine today. Denies CP and dyspnea.   Inpatient Medications    Scheduled Meds: . sodium chloride   Intravenous Once  . allopurinol  300 mg Oral Daily  . citalopram  20 mg Oral Daily  . hydrOXYzine  25 mg Oral BID  . mometasone-formoterol  2 puff Inhalation BID  . mupirocin ointment  1 application Topical BID  . pantoprazole  40 mg Oral Daily  . polyethylene glycol  17 g Oral Daily  . pravastatin  80 mg Oral q1800  . predniSONE  40 mg Oral Q breakfast  . sodium chloride flush  3 mL Intravenous Q12H  . tamsulosin  0.4 mg Oral BID  . umeclidinium bromide  1 puff Inhalation Daily   Continuous Infusions: . azithromycin Stopped (10/22/17 2250)  . cefTRIAXone (ROCEPHIN)  IV Stopped (10/22/17 2148)   PRN Meds: acetaminophen **OR** acetaminophen, albuterol, ondansetron **OR** ondansetron (ZOFRAN) IV, senna-docusate   Vital Signs    Vitals:   10/22/17 2031 10/22/17 2041 10/23/17 0645 10/23/17 0726  BP: (!) 163/67  (!) 132/55   Pulse: (!) 58  (!) 57   Resp: 19  14   Temp: (!) 97.4 F (36.3 C)  98 F (36.7 C)   TempSrc: Oral  Oral   SpO2: 99% 100%  97%  Weight:   132.5 kg   Height:        Intake/Output Summary (Last 24 hours) at 10/23/2017 1156 Last data filed at 10/23/2017 0720 Gross per 24 hour  Intake 490 ml  Output -  Net 490 ml   Filed Weights   10/21/17 0514 10/22/17 0516 10/23/17 0645  Weight: 134.1 kg 133.7 kg 132.5 kg    Telemetry    SR/ sinus brady 50-60s- Personally Reviewed  ECG    SR diffuse ST depressions/ repol abnormality - Personally Reviewed  Physical Exam   GEN: obese elderly WM in No acute distress.   Neck: No JVD Cardiac: RRR, no murmurs, rubs, or gallops.  Respiratory: Clear to auscultation bilaterally. GI: Soft, nontender, non-distended  MS: No edema; No deformity. Neuro:  Nonfocal    Psych: Normal affect   Labs    Chemistry Recent Labs  Lab 10/19/17 1928  10/21/17 0250 10/22/17 0527 10/23/17 0649  NA 137   < > 136 136 138  K 4.0   < > 4.1 4.3 3.6  CL 107   < > 107 104 104  CO2 23   < > 20* 24 25  GLUCOSE 105*   < > 204* 173* 114*  BUN 17   < > 19 21 21   CREATININE 1.48*   < > 1.40* 1.30* 1.28*  CALCIUM 8.3*   < > 8.3* 8.4* 8.2*  PROT 7.0  --   --   --   --   ALBUMIN 4.2  --   --   --   --   AST 21  --   --   --   --   ALT 22  --   --   --   --   ALKPHOS 58  --   --   --   --   BILITOT 1.0  --   --   --   --   GFRNONAA 46*   < > 49* 54* 55*  GFRAA 54*   < > 57* >60 >60  ANIONGAP  7   < > 9 8 9    < > = values in this interval not displayed.     Hematology Recent Labs  Lab 10/22/17 0527 10/22/17 1439 10/23/17 0649  WBC 1.7* 2.2* 1.7*  RBC 2.20* 2.67* 2.35*  HGB 7.0* 8.3* 7.3*  HCT 21.3* 25.2* 22.4*  MCV 96.8 94.4 95.3  MCH 31.8 31.1 31.1  MCHC 32.9 32.9 32.6  RDW 17.9* 18.0* 17.8*  PLT 36* 36* 28*    Cardiac Enzymes Recent Labs  Lab 10/19/17 2134 10/20/17 1505 10/20/17 1930 10/21/17 0250  TROPONINI <0.03 0.15* 0.14* 0.13*    Recent Labs  Lab 10/19/17 1913  TROPIPOC 0.03     BNPNo results for input(s): BNP, PROBNP in the last 168 hours.   DDimer No results for input(s): DDIMER in the last 168 hours.   Radiology    No results found.  Cardiac Studies   2D Echo 10/23/17 Study Conclusions  - Left ventricle: The cavity size was normal. Wall thickness was   increased in a pattern of mild LVH. The estimated ejection   fraction was 55%. Basal inferior akinesis. Features are   consistent with a pseudonormal left ventricular filling pattern,   with concomitant abnormal relaxation and increased filling   pressure (grade 2 diastolic dysfunction). - Aortic valve: There was no stenosis. - Aorta: Mildly dilated aortic root. Aortic root dimension: 38 mm   (ED). - Mitral valve: There was no significant regurgitation. - Left  atrium: The atrium was moderately dilated. - Right ventricle: The cavity size was normal. Systolic function   was normal. - Pulmonary arteries: No complete TR doppler jet so unable to   estimate PA systolic pressure. - Systemic veins: IVC not visualized.  Impressions:  - Technically difficult study with poor acoustic windows. Normal LV   size with mild LV hypertrophy. EF 55%. Basal inferior akinesis.   Moderate diastolic dysfunction. Normal RV size and systolic   function. No significant valvular abnormalities.   Patient Profile     72 y.o. male with hx of myelofibrosis and now pancytopenia, chronic kidney disease, COPDwho is being seen today for the evaluation of exertional chest discomfort and abnormal ECGat the request of Dr. Algis Liming   Assessment & Plan    1. Chest discomfort on exertion, ECG abnormalities, now with positive but flat troponins: in the setting of pneumonia and possibly sepsis, in addition to profound anemia with admit Hgb of 6.4 (7.3 post transfusion) - Troponin trend, 0.15>>0.14>>0.13 - Echo with normal LVEF, G2DD and  Basal inferior akinesis (no prior echo for comparison).  - currently CP free at rest.  -this is a type II NSTEMI, and in the setting of pancytopenia would not anticoagulate or use aspirin at this time -contraindicated for cath with his anemia/thrombocytopenia. Would do noninvasive stress imaging once his anemia and pneumonia improve for risk stratification - for now continue statin. No ASA due to pancytopenia/ thrombocytopenia. No BB due to bradycardia. -strict instructions given on need to call if he has chest pain while inpatient    Per primary team: All of these issues complicate his symptoms and impact the options for cardiac evaluation Pneumonia, likely with sepsis Myelofibrosis, with pancytopenia COPD with acute exacerbation Chronic kidney disease, stage III  For questions or updates, please contact [facility] Please consult  [email or web address] for contact info under        Signed, Lyda Jester, PA-C  10/23/2017, 11:56 AM

## 2017-10-24 ENCOUNTER — Other Ambulatory Visit: Payer: Self-pay

## 2017-10-24 DIAGNOSIS — D638 Anemia in other chronic diseases classified elsewhere: Secondary | ICD-10-CM

## 2017-10-24 LAB — TYPE AND SCREEN
ABO/RH(D): O POS
ANTIBODY SCREEN: NEGATIVE
UNIT DIVISION: 0
UNIT DIVISION: 0
UNIT DIVISION: 0
Unit division: 0

## 2017-10-24 LAB — CULTURE, BLOOD (ROUTINE X 2)
Culture: NO GROWTH
Culture: NO GROWTH
SPECIAL REQUESTS: ADEQUATE

## 2017-10-24 LAB — BPAM RBC
BLOOD PRODUCT EXPIRATION DATE: 201910132359
BLOOD PRODUCT EXPIRATION DATE: 201910282359
BLOOD PRODUCT EXPIRATION DATE: 201911042359
Blood Product Expiration Date: 201911042359
ISSUE DATE / TIME: 201910070949
ISSUE DATE / TIME: 201910051037
ISSUE DATE / TIME: 201910052027
ISSUE DATE / TIME: 201910081238
UNIT TYPE AND RH: 5100
Unit Type and Rh: 5100
Unit Type and Rh: 5100
Unit Type and Rh: 9500

## 2017-10-24 LAB — CBC
HCT: 28.1 % — ABNORMAL LOW (ref 39.0–52.0)
Hemoglobin: 9.2 g/dL — ABNORMAL LOW (ref 13.0–17.0)
MCH: 30.4 pg (ref 26.0–34.0)
MCHC: 32.7 g/dL (ref 30.0–36.0)
MCV: 92.7 fL (ref 80.0–100.0)
NRBC: 4.7 % — AB (ref 0.0–0.2)
PLATELETS: 35 10*3/uL — AB (ref 150–400)
RBC: 3.03 MIL/uL — ABNORMAL LOW (ref 4.22–5.81)
RDW: 17.5 % — AB (ref 11.5–15.5)
WBC: 2.7 10*3/uL — ABNORMAL LOW (ref 4.0–10.5)

## 2017-10-24 MED ORDER — INFLUENZA VAC SPLIT HIGH-DOSE 0.5 ML IM SUSY
0.5000 mL | PREFILLED_SYRINGE | INTRAMUSCULAR | Status: AC
  Administered 2017-10-24: 0.5 mL via INTRAMUSCULAR
  Filled 2017-10-24: qty 0.5

## 2017-10-24 MED ORDER — PRAVASTATIN SODIUM 80 MG PO TABS: 80.0000 mg | ORAL_TABLET | Freq: Every day | ORAL | 4 refills | Status: AC

## 2017-10-24 MED ORDER — AZITHROMYCIN 500 MG PO TABS: 500.0000 mg | ORAL_TABLET | Freq: Every day | ORAL | Status: DC

## 2017-10-24 MED ORDER — CEFUROXIME AXETIL 500 MG PO TABS: 500.0000 mg | ORAL_TABLET | Freq: Two times a day (BID) | ORAL | 0 refills | Status: AC

## 2017-10-24 MED ORDER — PREDNISONE 10 MG PO TABS: ORAL_TABLET | ORAL | 0 refills | Status: AC

## 2017-10-24 MED ORDER — CEFDINIR 300 MG PO CAPS
300.0000 mg | ORAL_CAPSULE | Freq: Two times a day (BID) | ORAL | Status: DC
  Filled 2017-10-24: qty 1

## 2017-10-24 MED ORDER — AZITHROMYCIN 500 MG PO TABS: 500.0000 mg | ORAL_TABLET | Freq: Every day | ORAL | 0 refills | Status: AC

## 2017-10-24 MED ORDER — BUDESONIDE-FORMOTEROL FUMARATE 160-4.5 MCG/ACT IN AERO: 2.0000 | INHALATION_SPRAY | Freq: Two times a day (BID) | RESPIRATORY_TRACT | 12 refills | Status: AC

## 2017-10-24 NOTE — Care Management Note (Signed)
Case Management Note  Patient Details  Name: Bruce [name] MRN: [id number] Date of Birth: [birth year]  Subjective/Objective:   Sepsis                Action/Plan: Patient lives at home with spouse; PCP: [name], MD; has private insurance with Medicare; has prescription coverage with [facility]; pharmacy of choice is [facility]; pt reports no problem getting his medication; DME - cane and walker at home; [facility] choice offered, pt chose [place]; Dan with [facility] called for arrangements;  Expected Discharge Date:  10/24/17               Expected Discharge Plan:  [place] Choice offered to:  Patient  HH Arranged:  RN, Disease Management, PT [place] Agency:  [place]  Status of Service:  In process, will continue to follow  Sherrilyn Rist [phone] 10/24/2017, 10:44 AM

## 2017-10-24 NOTE — Progress Notes (Signed)
Pt and pt's wife  given discharge instructions with understanding. Pt has no questions at this time. Prescriptions given to pt. Iv and monitor d/c.

## 2017-10-24 NOTE — Care Management Important Message (Signed)
Important Message  Patient Details  Name: Bruce [name] MRN: [id number] Date of Birth: 10-12-1945   Medicare Important Message Given:  Yes    Andrianna Manalang P [place] 10/24/2017, 11:06 AM

## 2017-10-24 NOTE — Discharge Summary (Signed)
Physician Discharge Summary   Patient ID: Bruce [name] MRN: [id number] DOB/AGE: 70-Oct-1947 70 y.o.  Admit date: 10/19/2017 Discharge date: 10/24/2017  Primary Care Physician:  [name], MD   Recommendations for Outpatient Follow-up:  1. Follow up with PCP in 1-2 weeks, follow-up scheduled on 10/11 2. Patient recommended to have CBC checked on 10/11, may need blood transfusion.  3. Patient needs follow-up appointment with Dr. Tama High in 7 to 10 days 4. Patient recommended to hold Jakafi at this time   Home Health: Home health PT OT, RN, aide Equipment/Devices:   Discharge Condition: stable  CODE STATUS: FULL  Diet recommendation: Heart healthy diet   Discharge Diagnoses:    . Sepsis due to pneumonia ([place]) Symptomatic anemia/pancytopenia secondary to myelofibrosis and pronounced splenomegaly . COPD with acute exacerbation ([place]) . Chest discomfort . BPH . Morbid obesity . CKD (chronic kidney disease), stage III ([facility]) Hyperlipidemia Hyperuricemia Essential hypertension  Consults: Cardiology Medical oncology    Allergies:   Allergies  Allergen Reactions  . Morphine And Related Itching and Nausea Only    "makes him fell sick and talk out of his head"  . Penicillins Hives and Rash    Has patient had a PCN reaction causing immediate rash, facial/tongue/throat swelling, SOB or lightheadedness with hypotension: Yes Has patient had a PCN reaction causing severe rash involving mucus membranes or skin necrosis: No Has patient had a PCN reaction that required hospitalization: No Has patient had a PCN reaction occurring within the last 10 years: No If all of the above answers are "NO", then may proceed with Cephalosporin use.      DISCHARGE MEDICATIONS: Allergies as of 10/24/2017      Reactions   Morphine And Related Itching, Nausea Only   "makes him fell sick and talk out of his head"   Penicillins Hives, Rash   Has patient had a PCN reaction causing  immediate rash, facial/tongue/throat swelling, SOB or lightheadedness with hypotension: Yes Has patient had a PCN reaction causing severe rash involving mucus membranes or skin necrosis: No Has patient had a PCN reaction that required hospitalization: No Has patient had a PCN reaction occurring within the last 10 years: No If all of the above answers are "NO", then may proceed with Cephalosporin use.      Medication List    STOP taking these medications   JAKAFI 20 MG tablet Generic drug:  ruxolitinib phosphate     TAKE these medications   allopurinol 300 MG tablet Commonly known as:  ZYLOPRIM Take 300 mg by mouth daily.   azithromycin 500 MG tablet Commonly known as:  ZITHROMAX Take 1 tablet (500 mg total) by mouth daily for 2 days. Start taking on:  10/25/2017   budesonide-formoterol 160-4.5 MCG/ACT inhaler Commonly known as:  SYMBICORT Inhale 2 puffs into the lungs 2 (two) times daily.   cefUROXime 500 MG tablet Commonly known as:  CEFTIN Take 1 tablet (500 mg total) by mouth 2 (two) times daily with a meal for 2 days. Start taking on:  10/25/2017   citalopram 20 MG tablet Commonly known as:  CELEXA Take 20 mg by mouth daily.   hydrOXYzine 25 MG tablet Commonly known as:  ATARAX/VISTARIL Take 25 mg by mouth 2 (two) times daily.   INCRUSE ELLIPTA 62.5 MCG/INH Aepb Generic drug:  umeclidinium bromide Inhale 2 puffs into the lungs daily.   mupirocin ointment 2 % Commonly known as:  BACTROBAN Apply 1 application topically 2 (two) times daily.   pantoprazole  40 MG tablet Commonly known as:  PROTONIX Take 40 mg by mouth daily.   pravastatin 80 MG tablet Commonly known as:  PRAVACHOL Take 1 tablet (80 mg total) by mouth daily. What changed:    how much to take  when to take this   predniSONE 10 MG tablet Commonly known as:  DELTASONE Prednisone dosing: Take  Prednisone 68m (4 tabs) x 3 days, then taper to 349m(3 tabs) x 3 days, then 2018m2 tabs) x 3days,  then 72m32m tab) x 3days, then OFF. Notes to patient:  Sunday 10/13 30 mg, Monday 10/14 30 mg , Tuesday 10/15 30 mg Wednesday 10/16 20 mg, Thursday 10/17 20 mg, Friday 10/18 20 mg Saturday 10/19 10 mg, Sunday 10/20 10 mg, Monday 10/21 72mg72mamsulosin 0.4 MG Caps capsule Commonly known as:  FLOMAX Take 0.4 mg by mouth 2 (two) times daily.   triamcinolone cream 0.1 % Commonly known as:  KENALOG Apply 1 application topically 2 (two) times daily.   VITAMIN D3 PO Take 2 capsules by mouth daily.        Brief H and P: For complete details please refer to admission H and P, but in brief 72 ye74 old married male, independent, [place] in [employer] of myelofibrosis (Dr. NeijeBeryle Latheatology/Oncology), pronounced splenomegaly (20.6 by ultrasound 10/16/2017, decreased in comparison to prior study of 29 cm), hepatic steatosis, hepatomegaly, COPD, HTN, HLD, hyperuricemia, MDD & anxiety, BPH, stage III chronic kidney disease, GERD, [facility] 3 weeks history of progressive weakness, lethargy, cough which recently became productive of green sputum, dyspnea and 1 week history of upper midsternal pain, mostly with activity, relieved at times with rest, occasional radiation to right upper extremity and new onset high fever.  EMS found patient to be febrile and tachypneic.  EDP discussed with cardiology on-call due to chest pain and abnormal EKG and [facility]  [facility], symptomatic anemia/pancytopenia (hemoglobin 6.4), chest pain with abnormal EKG.  Cardiology consulted.  Sepsis resolved.  Hospital course complicated by persistent pancytopenia, transfusion dependent anemia.  Hospital Course:   Sepsis secondary to community-acquired pneumonia Resolved, met sepsis criteria on admission Chest x-ray suggested pneumonia, blood culture remained negative till date Flu PCR  negative Patient was empirically placed on IV ceftriaxone and Zithromax, transition to oral antibiotics, complete course for 2 more days.  Symptomatic anemia with pancytopenia, myelofibrosis with pronounced splenomegaly -Patient follows outpatient heme oncology, last hemoglobin 08/2017 prior to admission was 10.7 Presented with hemoglobin of 6.4 in the absence of any overt bleeding. Oncology was consulted, per Dr. Feng,Burr Medicocytopenia may be related to progressive myelofibrosis or ruxolitinib was placed on hold. Patient will continue to hold ruxolitinib until he follows with his primary hematologist, Dr. NeijsTressie Stalkerent received total of 5 units of packed RBC transfusion since admission, IMA globin 9.2 at the time of discharge.  White count improving 2.7, platelets improving to 35K, no overt bleeding.  Patient had received Granix. Dr. Feng Burr Medicoussed with patient's oncologist for follow-up. Patient was cleared for discharge by Dr. Feng Burr Medicorecommended a CBC this Friday or Monday.  He may require transfusion if hemoglobin trending down again Patient will have CBC checked on 10/26/2017 on Friday   Chest pain, elevated troponins  EKG showed ST depressions T wave inversion in inferior lateral leads, troponin elevated to 0.15 Cardiology was consulted, suspected NSTEMI type II likely due to demand ischemia from  sepsis/pneumonia and symptomatic anemia Continue statin 2D echo 10/7 showed EF of 55% with basal inferior akinesis and grade 2 diastolic dysfunction Cardiology recommended stable for discharge from their standpoint and recommended outpatient follow-up and will consider nuclear stress test or coronary CTA at that time. No aspirin or Plavix due to pancytopenia and no beta-blockers due to bradycardia  COPD with mild acute exacerbation Likely exacerbated by pneumonia Patient was placed on IV Solu-Medrol, nebulizers, O2 Home O2 evaluation was done, does not need any O2 at this time, patient was  transitioned to oral prednisone, continue antibiotics  CKD stage III Creatinine 1.2 at the time of discharge, at baseline  Essential hypertension Stable  Hyperlipidemia Continue pravastatin  Hyperuricemia Continue allopurinol   Morbid obesity, BMI 45.7 May have underlying OSA, recommended diet and weight control  Anxiety Continue Celexa, hydroxyzine  BPH Continue home medications  Hyperglycemia Likely due to steroids, A1c 5.8  PT OT evaluation recommended home health PT, arranged by the case management  Day of Discharge S: Feeling better, no acute complaints, hoping to go home.  Shortness of breath improving, not on O2 today.  Afebrile  BP 97/61   Pulse 67   Temp 98.2 F (36.8 C) (Oral)   Resp 18   Ht 5' 7"  (1.702 m)   Wt 131.8 kg Comment: scale a  SpO2 96%   BMI 45.50 kg/m   Physical Exam: General: Alert and awake oriented x3 not in any acute distress. HEENT: anicteric sclera, pupils reactive to light and accommodation CVS: S1-S2 clear no murmur rubs or gallops Chest: clear to auscultation bilaterally, no wheezing rales or rhonchi Abdomen: soft nontender, nondistended, normal bowel sounds Extremities: no cyanosis, clubbing or edema noted bilaterally Neuro: Cranial nerves II-XII intact, no focal neurological deficits   The results of significant diagnostics from this hospitalization (including imaging, microbiology, ancillary and laboratory) are listed below for reference.      Procedures/Studies:  Dg Chest Port 1 View  Result Date: 10/19/2017 CLINICAL DATA:  Cough, congestion, and fever for 3 days. EXAM: PORTABLE CHEST 1 VIEW COMPARISON:  01/05/2014 FINDINGS: The cardiac silhouette is normal in size. The interstitial markings are diffusely increased throughout both lungs, and there are asymmetric patchy opacities in the right lung base. Mild streaky left basilar opacities are present as well. No sizable pleural effusion or pneumothorax is identified.  No acute osseous abnormality is seen. IMPRESSION: Diffuse interstitial and patchy right greater than left basilar airspace opacities concerning for pneumonia. Electronically Signed   By: Logan Bores M.D.   On: 10/19/2017 20:47      LAB RESULTS: Basic Metabolic Panel: Recent Labs  Lab 10/22/17 0527 10/23/17 0649  NA 136 138  K 4.3 3.6  CL 104 104  CO2 24 25  GLUCOSE 173* 114*  BUN 21 21  CREATININE 1.30* 1.28*  CALCIUM 8.4* 8.2*   Liver Function Tests: Recent Labs  Lab 10/19/17 1928  AST 21  ALT 22  ALKPHOS 58  BILITOT 1.0  PROT 7.0  ALBUMIN 4.2   No results for input(s): LIPASE, AMYLASE in the last 168 hours. No results for input(s): AMMONIA in the last 168 hours. CBC: Recent Labs  Lab 10/23/17 0649 10/23/17 1919  WBC 1.7* 2.7*  NEUTROABS 1.3*  --   HGB 7.3* 9.2*  HCT 22.4* 28.1*  MCV 95.3 92.7  PLT 28* 35*   Cardiac Enzymes: Recent Labs  Lab 10/20/17 1930 10/21/17 0250  TROPONINI 0.14* 0.13*   BNP: Invalid input(s): POCBNP CBG: No  results for input(s): GLUCAP in the last 168 hours.    Disposition and Follow-up: Discharge Instructions    Diet - low sodium heart healthy   Complete by:  As directed    Increase activity slowly   Complete by:  As directed        DISPOSITION: [place]    Buford Dresser, MD Follow up on 11/06/2017.   Specialty:  Cardiology Why:  9:00 AM hospital follow-up cardiology  Contact information: [address] [place] [address] [phone]        [name], MD. Go on 10/26/2017.   Specialty:  Family Medicine Why:  @8 :45 please repeat CBC Contact information: [address] [place] [address] [phone]        [facility] - Dme Follow up.   Why:  They will do your home health care at your home Contact information: [address] [place] [address] [phone]        Everardo All, MD.  Schedule an appointment as soon as possible for a visit in 1 week(s).   Specialty:  Oncology Why:  1 week Contact information: [place] [address] [phone]            Time coordinating discharge:  35-minutes  Signed:   Estill Cotta M.D. [facility] 10/24/2017, 11:34 AM Pager: [phone]

## 2017-10-24 NOTE — Evaluation (Signed)
Physical Therapy Evaluation Patient Details Name: Bruce [name] MRN: [id number] DOB: 1946/01/01 Today's Date: 10/24/2017   History of Present Illness  72 yo male with onset of PNA and subsequent sepsis was admitted, had acute COPD episode and required O2 initially.  Noted chest tightness cleared by cardiac MD, off O2 and improved hgb and resolved sepsis.  PMHx:  myelofibrosis, COPD, CKD 3, SOB, gout, L hip fracture with THA, asthma  Clinical Impression  Pt was assessed without O2 on the hall: SATURATION QUALIFICATIONS: (This note is used to comply with regulatory documentation for home oxygen)  Patient Saturations on Room Air at Rest = 90%  Patient Saturations on Room Air while Ambulating = 97%  Please briefly explain why patient needs home oxygen:  Pt did not need O2 to maintain sats during gait.    Follow Up Recommendations No PT follow up    Equipment Recommendations  None recommended by PT    Recommendations for Other Services       Precautions / Restrictions Precautions Precautions: Fall(telemetry) Restrictions Weight Bearing Restrictions: No      Mobility  Bed Mobility Overal bed mobility: Modified Independent(with bedrail)             General bed mobility comments: uses rail to pivot and sit side of bed partially due to type of bed with larger frame  Transfers Overall transfer level: Modified independent Equipment used: Rolling walker (2 wheeled)             General transfer comment: recalls hand placement on transfers without cues  Ambulation/Gait Ambulation/Gait assistance: Min guard Gait Distance (Feet): 150 Feet Assistive device: Rolling walker (2 wheeled) Gait Pattern/deviations: Step-through pattern;Decreased stride length;Wide base of support Gait velocity: reduced Gait velocity interpretation: <1.31 ft/sec, indicative of household ambulator General Gait Details: used room air after having cannula removed, no issues with sats   Stairs             Wheelchair Mobility    Modified Rankin (Stroke Patients Only)       Balance Overall balance assessment: Needs assistance Sitting-balance support: Feet supported Sitting balance-Leahy Scale: Good Sitting balance - Comments: a bit more of a struggle side of bed due to height of bed on return   Standing balance support: Bilateral upper extremity supported Standing balance-Leahy Scale: Fair Standing balance comment: fair without walker standing (prefers the walker due to fatigue of standing)                             Pertinent Vitals/Pain Pain Assessment: No/denies pain    Home Living Family/patient expects to be discharged to:: Private residence Living Arrangements: Spouse/significant other Available Help at Discharge: Family;Available 24 hours/day Type of Home: House Home Access: Stairs to enter Entrance Stairs-Rails: Left Entrance Stairs-Number of Steps: 1 Home Layout: One level Home Equipment: Walker - 2 wheels;Cane - single point;Shower seat;Grab bars - toilet;Grab bars - tub/shower Additional Comments: pt was set up from [place] previously    Prior Function Level of Independence: Independent         Comments: has but doesn't [place] need AD's     Hand Dominance   Dominant Hand: Right    Extremity/Trunk Assessment   Upper Extremity Assessment Upper Extremity Assessment: Overall WFL for tasks assessed    Lower Extremity Assessment Lower Extremity Assessment: Overall WFL for tasks assessed    Cervical / Trunk Assessment Cervical / Trunk Assessment: Normal  Communication  Communication: No difficulties  Cognition Arousal/Alertness: Awake/alert Behavior During Therapy: WFL for tasks assessed/performed Overall Cognitive Status: Within Functional Limits for tasks assessed                                        General Comments General comments (skin integrity, edema, etc.): O2 sat pregait was 90% and post was 95%,  pulse up from 71 baseline to 103 at end of walk    Exercises     Assessment/Plan    PT Assessment Patient needs continued PT services  PT Problem List Decreased mobility;Cardiopulmonary status limiting activity;Obesity       PT Treatment Interventions DME instruction;Gait training;Stair training;Functional mobility training;Therapeutic activities;Therapeutic exercise;Balance training;Neuromuscular re-education;Patient/family education    PT Goals (Current goals can be found in the Care Plan section)  Acute Rehab PT Goals Patient Stated Goal: to get home today PT Goal Formulation: With patient/family Time For Goal Achievement: 10/31/17 Potential to Achieve Goals: Good    Frequency Min 2X/week   Barriers to discharge Other (comment)(recent loss of O2 stability with effort an dPNA)      Co-evaluation               AM-PAC PT "6 Clicks" Daily Activity  Outcome Measure Difficulty turning over in bed (including adjusting bedclothes, sheets and blankets)?: A Little Difficulty moving from lying on back to sitting on the side of the bed? : A Little Difficulty sitting down on and standing up from a chair with arms (e.g., wheelchair, bedside commode, etc,.)?: Unable Help needed moving to and from a bed to chair (including a wheelchair)?: A Little Help needed walking in hospital room?: A Little Help needed climbing 3-5 steps with a railing? : A Little 6 Click Score: 16    End of Session Equipment Utilized During Treatment: Gait belt Activity Tolerance: Patient tolerated treatment well Patient left: in bed;with call bell/phone within reach;with family/visitor present Nurse Communication: Mobility status PT Visit Diagnosis: Other abnormalities of gait and mobility (R26.89)(pulses elevating with effort)    Time: 1030-1053 PT Time Calculation (min) (ACUTE ONLY): 23 min   Charges:   PT Evaluation $PT Eval Moderate Complexity: 1 Mod PT Treatments $Gait Training: 8-22 mins        Ramond Dial 10/24/2017, 11:18 AM   Mee Hives, PT MS Acute Rehab Dept. Number: [place] and [place]

## 2017-10-29 ENCOUNTER — Other Ambulatory Visit (HOSPITAL_COMMUNITY): Payer: Self-pay | Admitting: Family Medicine

## 2017-10-29 ENCOUNTER — Ambulatory Visit (HOSPITAL_COMMUNITY)
Admission: RE | Admit: 2017-10-29 | Discharge: 2017-10-29 | Disposition: A | Discharge status: Home / Self Care | Payer: Medicare Other | Source: Ambulatory Visit | Attending: Family Medicine | Admitting: Family Medicine

## 2017-10-29 DIAGNOSIS — J189 Pneumonia, unspecified organism: Secondary | ICD-10-CM | POA: Diagnosis not present

## 2017-10-29 DIAGNOSIS — R918 Other nonspecific abnormal finding of lung field: Secondary | ICD-10-CM | POA: Diagnosis not present

## 2017-11-06 ENCOUNTER — Ambulatory Visit: Payer: Medicare Other | Admitting: Cardiology

## 2017-11-12 ENCOUNTER — Ambulatory Visit: Payer: Medicare Other | Admitting: Physician Assistant

## 2018-05-13 NOTE — Progress Notes (Signed)
REVIEWED-NO ADDITIONAL RECOMMENDATIONS. 

## 2018-12-01 IMAGING — CR DG CHEST 1V PORT
1 series · 2 of 2 positions shown · non-contrast
Comparison: 01/05/2014

CLINICAL DATA: Cough, congestion, and fever for 3 days.

EXAM:
PORTABLE CHEST 1 VIEW

[Series 1: ap · 0.17mm/px · 2 of 2 slices shown]
[im 1/2]
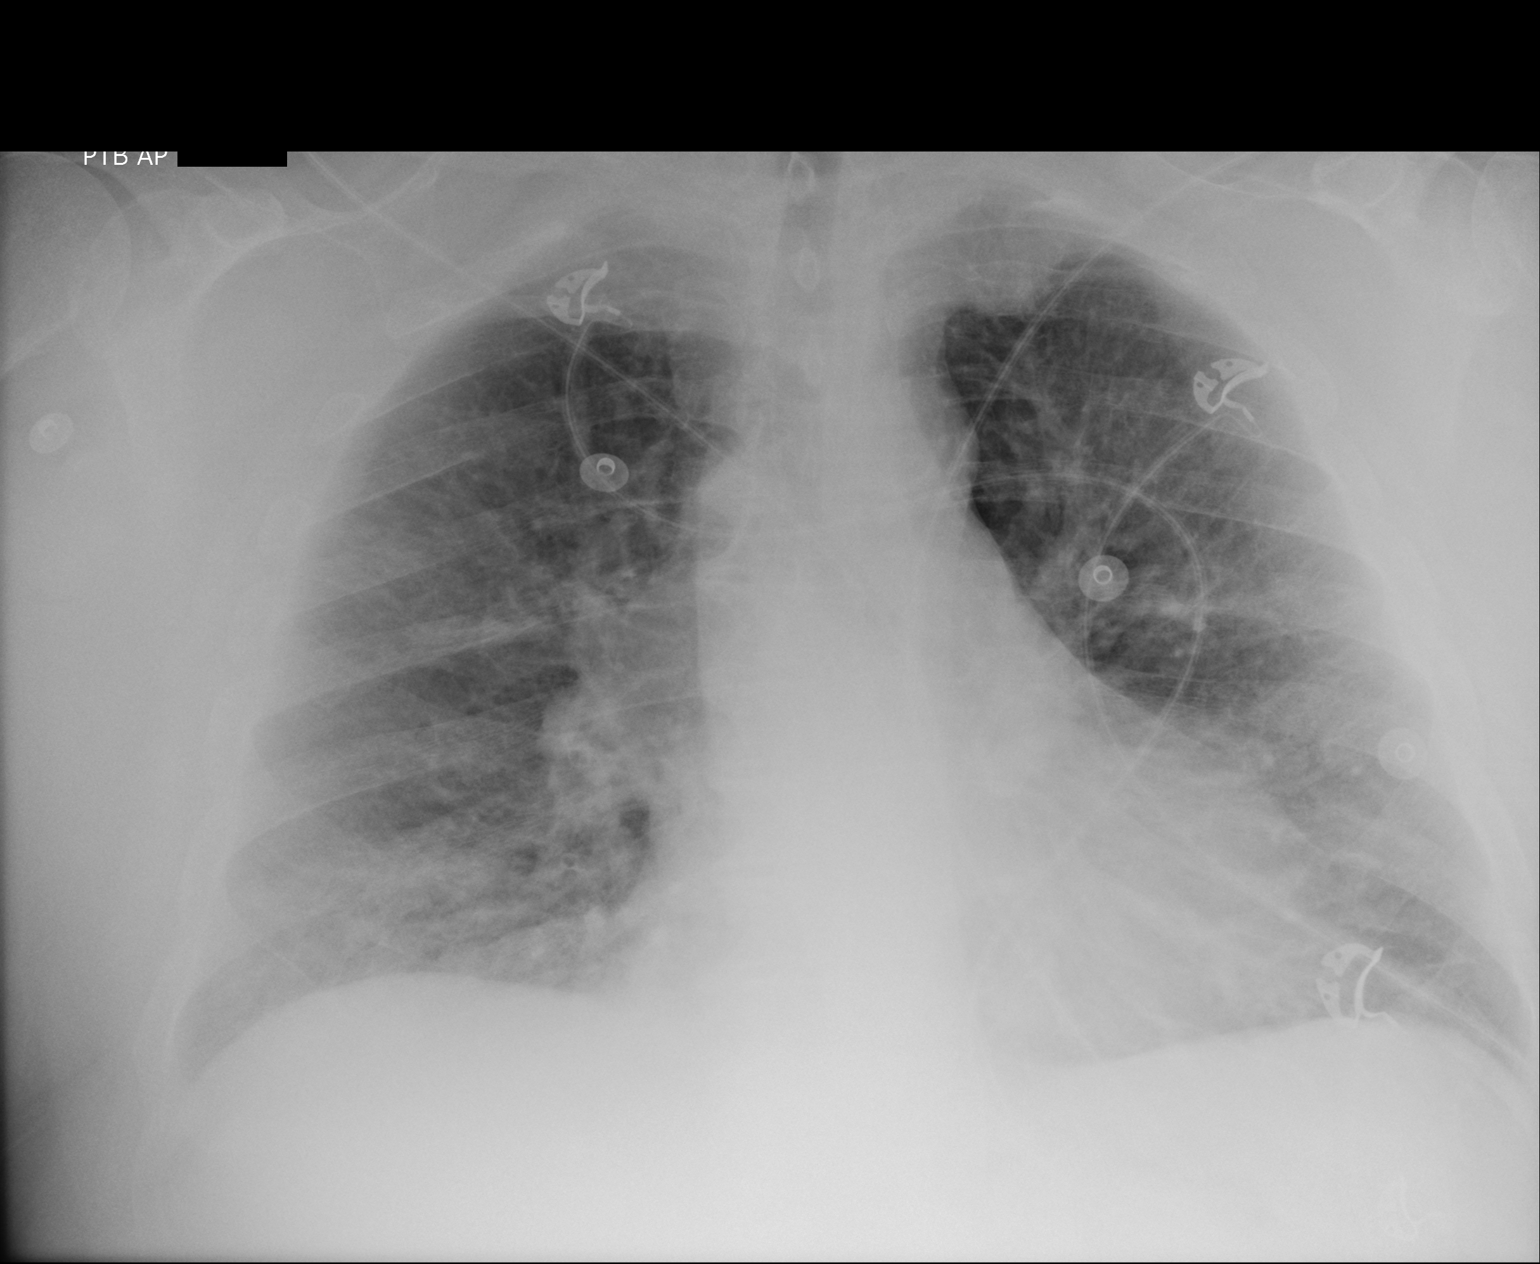
[im 2/2]
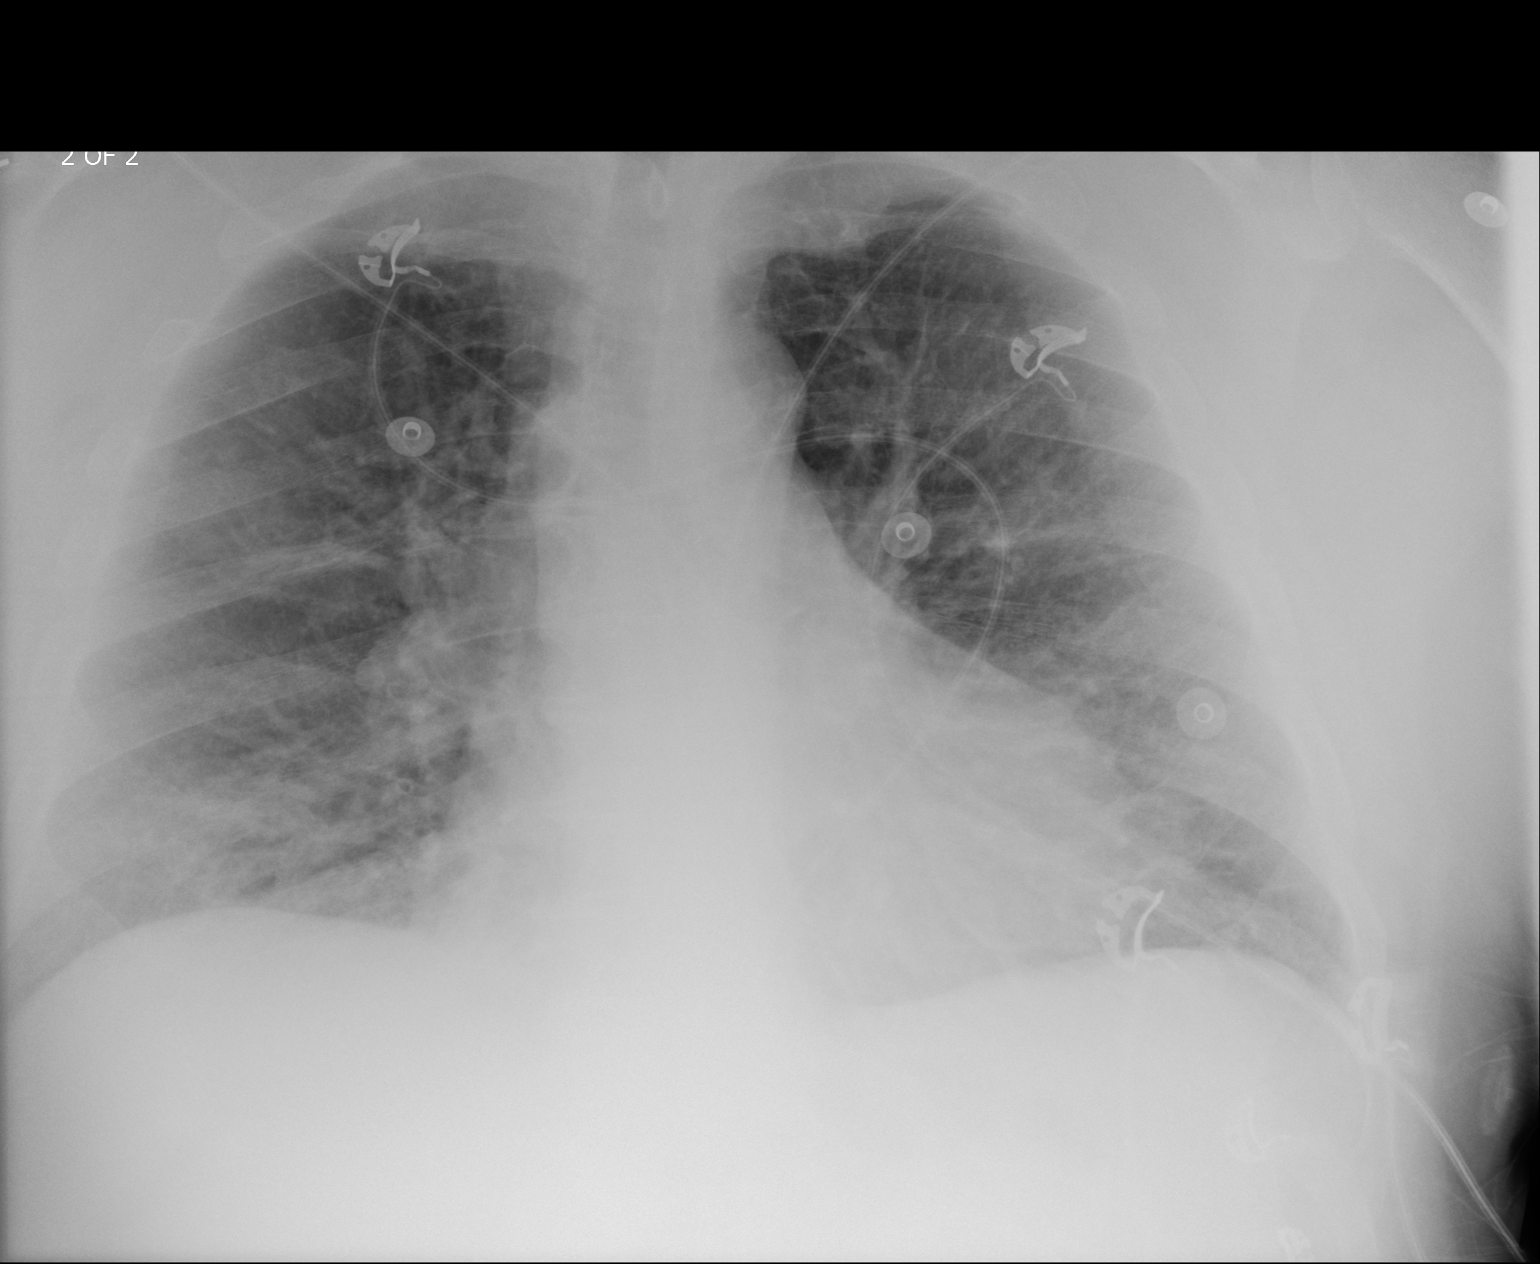

[2 of 2 positions shown; findings below may reference images not displayed]

FINDINGS: The cardiac silhouette is normal in size. The interstitial markings
are diffusely increased throughout both lungs, and there are
asymmetric patchy opacities in the right lung base. Mild streaky
left basilar opacities are present as well. No sizable pleural
effusion or pneumothorax is identified. No acute osseous abnormality
is seen.
IMPRESSION: Diffuse interstitial and patchy right greater than left basilar
airspace opacities concerning for pneumonia.

## 2019-02-17 ENCOUNTER — Encounter: Payer: Self-pay | Admitting: Internal Medicine

## 2021-02-16 DEATH — deceased

## 2023-12-19 ENCOUNTER — Encounter (INDEPENDENT_AMBULATORY_CARE_PROVIDER_SITE_OTHER): Payer: Self-pay | Admitting: *Deleted
